# Patient Record
Sex: Female | Born: 1965
Health system: Southern US, Community
[De-identification: ages and names within clinical notes are randomized; demographics above are authoritative.]

## PROBLEM LIST (undated history)

## (undated) DIAGNOSIS — G4710 Hypersomnia, unspecified: Secondary | ICD-10-CM

## (undated) DIAGNOSIS — T7840XA Allergy, unspecified, initial encounter: Secondary | ICD-10-CM

## (undated) DIAGNOSIS — Z973 Presence of spectacles and contact lenses: Secondary | ICD-10-CM

## (undated) DIAGNOSIS — G4733 Obstructive sleep apnea (adult) (pediatric): Secondary | ICD-10-CM

## (undated) DIAGNOSIS — Z9989 Dependence on other enabling machines and devices: Secondary | ICD-10-CM

## (undated) DIAGNOSIS — G4730 Sleep apnea, unspecified: Principal | ICD-10-CM

## (undated) HISTORY — DX: Sleep apnea, unspecified: G47.30

## (undated) HISTORY — DX: Allergy, unspecified, initial encounter: T78.40XA

## (undated) HISTORY — DX: Dependence on other enabling machines and devices: Z99.89

## (undated) HISTORY — DX: Obstructive sleep apnea (adult) (pediatric): G47.33

## (undated) HISTORY — PX: HYSTEROSCOPY WITH D & C: SHX1775

## (undated) HISTORY — DX: Hypersomnia, unspecified: G47.10

## (undated) HISTORY — PX: WISDOM TOOTH EXTRACTION: SHX21

---

## 1998-03-11 ENCOUNTER — Other Ambulatory Visit: Admission: RE | Admit: 1998-03-11 | Discharge: 1998-03-11 | Payer: Self-pay | Admitting: Obstetrics and Gynecology

## 1999-03-24 ENCOUNTER — Ambulatory Visit (HOSPITAL_COMMUNITY): Admission: RE | Admit: 1999-03-24 | Discharge: 1999-03-24 | Payer: Self-pay | Admitting: *Deleted

## 1999-03-24 ENCOUNTER — Encounter: Payer: Self-pay | Admitting: Family Medicine

## 2003-08-01 ENCOUNTER — Ambulatory Visit (HOSPITAL_COMMUNITY): Admission: RE | Admit: 2003-08-01 | Discharge: 2003-08-01 | Payer: Self-pay | Admitting: Emergency Medicine

## 2003-08-01 ENCOUNTER — Encounter: Payer: Self-pay | Admitting: Emergency Medicine

## 2003-12-04 ENCOUNTER — Other Ambulatory Visit: Admission: RE | Admit: 2003-12-04 | Discharge: 2003-12-04 | Payer: Self-pay | Admitting: Obstetrics and Gynecology

## 2005-02-07 ENCOUNTER — Other Ambulatory Visit: Admission: RE | Admit: 2005-02-07 | Discharge: 2005-02-07 | Payer: Self-pay | Admitting: Obstetrics and Gynecology

## 2006-02-07 ENCOUNTER — Other Ambulatory Visit: Admission: RE | Admit: 2006-02-07 | Discharge: 2006-02-07 | Payer: Self-pay | Admitting: Obstetrics and Gynecology

## 2007-01-18 ENCOUNTER — Emergency Department (HOSPITAL_COMMUNITY): Admission: EM | Admit: 2007-01-18 | Discharge: 2007-01-18 | Payer: Self-pay | Admitting: Emergency Medicine

## 2007-01-20 ENCOUNTER — Emergency Department (HOSPITAL_COMMUNITY): Admission: EM | Admit: 2007-01-20 | Discharge: 2007-01-20 | Payer: Self-pay | Admitting: Emergency Medicine

## 2007-08-30 ENCOUNTER — Encounter (INDEPENDENT_AMBULATORY_CARE_PROVIDER_SITE_OTHER): Payer: Self-pay | Admitting: Obstetrics and Gynecology

## 2007-08-30 ENCOUNTER — Ambulatory Visit (HOSPITAL_COMMUNITY): Admission: RE | Admit: 2007-08-30 | Discharge: 2007-08-30 | Payer: Self-pay | Admitting: Obstetrics and Gynecology

## 2011-03-08 NOTE — Op Note (Signed)
NAME:  Alicia [name], Alicia [name] NO.:  [id number]   MEDICAL RECORD NO.:  [id number]          PATIENT TYPE:  AMB   LOCATION:  SDC                           FACILITY:  [facility]   PHYSICIAN:  Janine Limbo, M.D.DATE OF BIRTH:  Jul 06, 1966   DATE OF PROCEDURE:  08/30/2007  DATE OF DISCHARGE:                               OPERATIVE REPORT   PREOPERATIVE DIAGNOSES:  1. Irregular uterine bleeding.  2. Endometrial polyps.  3. Fibroid uterus.   POSTOPERATIVE DIAGNOSES:  1. Irregular uterine bleeding.  2. Endometrial polyps.  3. Fibroid uterus.   PROCEDURE:  1. Hysteroscopy.  2. Dilatation and curettage.   SURGEON:  Janine Limbo, M.D.   FIRST ASSISTANT:  None.   ANESTHESIA:  General.   DISPOSITION:  Alicia [name] is a 45 year old female who presents with  the above-mentioned diagnoses.  She understands the indications for her  surgical procedure, and she accepts the risks of, but not limited to,  anesthetic complications, bleeding, infection, and possible damage to  surrounding organs.   FINDINGS:  The uterus was upper limits normal size and slightly  irregular.  No adnexal masses were appreciated on examination under  anesthesia.  On hysteroscopy, the patient was noted to have several  small polyps measuring less than 0.5 cm in size.  No other uterine  pathology was noted.   PROCEDURE:  The patient was taken to the operating room where general  anesthetic was given.  The patient's abdomen, perineum, and vagina were  prepped with multiple layers of Betadine.  The bladder was drained of  urine.  Examination under anesthesia was performed.  The patient was  sterilely draped.  A paracervical block was placed using 10 mL of 0.5%  Marcaine with epinephrine.  An additional 10 mL of 0.5% Marcaine with  epinephrine were injected directly into the cervix.  An endocervical  curettage was obtained.  The uterus sounded to 8 cm.  The cervix was  gently dilated.  The  operative hysteroscope was inserted, and the cavity  was carefully inspected.  Small polyps and abundant endometrial tissue  were noted.  The operative hysteroscope was removed, and the cavity was  curetted until it was felt to be clean.  Hemostasis was adequate.  The  hysteroscope was reinserted, and the cavity was again inspected.  Pictures were taken.  At this point, we felt that the cavity was clean  and we were ready to end our procedure.  All incisions were removed.  The examination was repeated, and the uterus was noted to be firm.  Sponge, needle, and instrument counts were correct on two occasions.  Estimated blood loss for the procedure was 10 mL.  The patient tolerated  her procedure well.  The estimated fluid deficit was 135 mL, although  there was a large amount of fluid noted on the operating room floor.   The patient was awakened without difficulty and taken to the recovery  room in stable condition.  The endocervical curettage and endometrial  curettings were sent to pathology for evaluation.   FOLLOWUP INSTRUCTIONS:  The patient was given a prescription for  ibuprofen, and she  will take 800 mg every 8 hours as needed for mild-to-  moderate pain.  She will take Darvocet-N 100 1 tablet every 4-6 hours as  needed for severe pain.  She was given a copy of the postoperative  instruction sheet as prepared by the [facility] for  patients who have undergone a dilatation and curettage.  She will return  to see Dr. Stefano Gaul in 2-3 weeks for a followup examination.  She will  call for questions or concerns.      Janine Limbo, M.D.  Electronically Signed     AVS/MEDQ  D:  08/30/2007  T:  08/30/2007  Job:  [id number]

## 2011-03-08 NOTE — H&P (Signed)
NAME:  Alicia [name], Alicia [name] NO.:  [id number]   MEDICAL RECORD NO.:  [id number]          PATIENT TYPE:  AMB   LOCATION:  SDC                           [facility]ILITY:  [facility]   PHYSICIAN:  Janine Limbo, M.D.DATE OF BIRTH:  19-Sep-1966   DATE OF ADMISSION:  DATE OF DISCHARGE:                              HISTORY & PHYSICAL   HISTORY OF PRESENT ILLNESS:  Ms. Mcmullan is a 45 year old female,  gravida 0, who presents for hysteroscopy with resection of an  endometrial polyp.  She will also have a dilatation and curettage.  The  patient is followed at the [facility].  The patient has had an ultrasound performed and  it showed that the uterus measures 8.48 x 5.14 cm.  The patient was  noted to have fibroids, with the largest measuring 1.4 cm in size.  The  ovaries appeared normal.  A hydrosonogram showed that there is a 1.6-cm  polyp present within the endometrial cavity.  GC and chlamydia cultures  of the cervix were negative.  The patient's most recent Pap smear was  within normal limits.   DRUG ALLERGIES:  The patient is allergic to LATEX.   PAST MEDICAL HISTORY:  1. The patient has a history of hypercholesterolemia.  2. She was also told that she had a ruptured disk in her back.   SOCIAL HISTORY:  The patient denies cigarette use, alcohol use, and  recreational drug use.   REVIEW OF SYSTEMS:  The patient has occasional postcoital bleeding.  She  has back pain.   FAMILY HISTORY:  Noncontributory.   PHYSICAL EXAM:  VITAL SIGNS:  Weight is 165 pounds.  HEENT:  Within normal limits.  CHEST:  Clear.  HEART:  Regular rate and rhythm.  BREASTS:  Without masses.  ABDOMEN:  Nontender.  EXTREMITIES:  Grossly normal.  NEUROLOGIC:  Exam is grossly normal.  PELVIC:  External genitalia are normal.  Vagina is normal.  Cervix is  nontender.  Uterus is normal in size, shape and consistency.  Adnexa:  No masses.   Rectovaginal exam confirms.   ASSESSMENT:  1. Irregular menstrual cycles.  2. Endometrial polyp.  3. Fibroid uterus.  4. Postcoital bleeding.   PLAN:  The patient will undergo hysteroscopy with resection of the  endometrial polyp.  She will also have a dilatation and curettage.  She  understands the indications for her surgical procedure and she accepts  the risks of, but not limited to, anesthetic complications, bleeding,  infection, and possible damage to the surrounding organs.      Janine Limbo, M.D.  Electronically Signed     AVS/MEDQ  D:  08/24/2007  T:  08/26/2007  Job:  [id number]

## 2011-08-02 LAB — URINALYSIS, ROUTINE W REFLEX MICROSCOPIC
Bilirubin Urine: NEGATIVE
Glucose, UA: NEGATIVE
Hgb urine dipstick: NEGATIVE
Ketones, ur: NEGATIVE
Nitrite: NEGATIVE
Protein, ur: NEGATIVE
Specific Gravity, Urine: 1.015
Urobilinogen, UA: 0.2
pH: 7

## 2011-08-02 LAB — CBC
HCT: 37.6
Hemoglobin: 13.1
MCHC: 34.8
MCV: 97.5
Platelets: 389
RBC: 3.86 — ABNORMAL LOW
RDW: 12.2
WBC: 5.9

## 2011-08-02 LAB — PREGNANCY, URINE: Preg Test, Ur: NEGATIVE

## 2012-10-15 ENCOUNTER — Ambulatory Visit: Payer: Self-pay | Admitting: Obstetrics and Gynecology

## 2012-11-05 ENCOUNTER — Ambulatory Visit: Payer: Self-pay | Admitting: Obstetrics and Gynecology

## 2012-12-17 ENCOUNTER — Encounter: Payer: Self-pay | Admitting: Obstetrics and Gynecology

## 2012-12-17 ENCOUNTER — Ambulatory Visit: Payer: BC Managed Care – PPO | Admitting: Obstetrics and Gynecology

## 2012-12-17 VITALS — BP 112/68 | Ht 65.6 in | Wt 196.0 lb

## 2012-12-17 DIAGNOSIS — Z01419 Encounter for gynecological examination (general) (routine) without abnormal findings: Secondary | ICD-10-CM

## 2012-12-17 NOTE — Progress Notes (Signed)
ANNUAL GYNECOLOGIC EXAMINATION   Alicia [name] is a 47 y.o. female, G0P0, who presents for an annual exam. Doing well. Not sexually active.    History   Social History  . Marital Status: Single    Spouse Name: N/A    Number of Children: N/A  . Years of Education: N/A   Social History Main Topics  . Smoking status: Never Smoker   . Smokeless tobacco: Never Used  . Alcohol Use: No  . Drug Use: No  . Sexually Active: No   Other Topics Concern  . None   Social History Narrative  . None    Menstrual cycle:   LMP: Patient's last menstrual period was 11/16/2012.             The following portions of the patient's history were reviewed and updated as appropriate: allergies, current medications, past family history, past medical history, past social history, past surgical history and problem list.  Review of Systems Pertinent items are noted in HPI. Breast:Negative for breast lump,nipple discharge or nipple retraction Gastrointestinal: Negative for abdominal pain, change in bowel habits or rectal bleeding Urinary:negative   Objective:    BP 112/68  Ht 5' 5.6" (1.666 m)  Wt 196 lb (88.905 kg)  BMI 32.03 kg/m2  LMP 11/16/2012    Weight:  Wt Readings from Last 1 Encounters:  12/17/12 196 lb (88.905 kg)          BMI: Body mass index is 32.03 kg/(m^2).  General Appearance: Alert, appropriate appearance for age. No acute distress HEENT: Grossly normal Neck / Thyroid: Supple, no masses, nodes or enlargement Lungs: clear to auscultation bilaterally Back: No CVA tenderness Breast Exam: No masses or nodes.No dimpling, nipple retraction or discharge. Cardiovascular: Regular rate and rhythm. S1, S2, no murmur Gastrointestinal: Soft, non-tender, no masses or organomegaly  ++++++++++++++++++++++++++++++++++++++++++++++++++++++++  Pelvic Exam: External genitalia: normal general appearance Vaginal: normal without tenderness, induration or masses. Relaxation: No Cervix:  normal appearance Adnexa: normal bimanual exam Uterus: normal size, shape, and consistency Rectovaginal: normal rectal, no masses  ++++++++++++++++++++++++++++++++++++++++++++++++++++++++  Lymphatic Exam: Non-palpable nodes in neck, clavicular, axillary, or inguinal regions Neurologic: Normal speech, no tremor  Psychiatric: Alert and oriented, appropriate affect.  Assessment:    Normal gyn exam   Overweight or obese: Yes   Pelvic relaxation: No  May want children.   Plan:    mammogram pap smear return annually or prn Contraception:abstinence   Medications prescribed: none  STD screen request: No   The updated Pap smear screening guidelines were discussed with the patient. The patient requested that I obtain a Pap smear: Yes.  Kegel exercises discussed: No.  Proper diet and regular exercise were reviewed.  Annual mammograms recommended starting at age 68. Proper breast care was discussed.  Screening colonoscopy is recommended beginning at age 93.  Regular health maintenance was reviewed.  Sleep hygiene was discussed.  Adequate calcium and vitamin D intake was emphasized.  IUI and egg preservation discussed.  Leonard Schwartz M.D.    Regular Periods: yes Mammogram: yes  Monthly Breast Ex.: yes Exercise: yes  Tetanus < 10 years: yes Seatbelts: yes  NI. Bladder Functn.: yes Abuse at home: no  Daily BM's: yes Stressful Work: yes  Healthy Diet: yes Sigmoid-Colonoscopy: never had one   Calcium: yes Medical problems this year: none   LAST PAP:06/25/2008  Contraception: none   Mammogram:  11/09/2012  PCP: Dr.Fland  PMH: none  FMH: none

## 2012-12-18 LAB — PAP IG W/ RFLX HPV ASCU

## 2012-12-19 LAB — HUMAN PAPILLOMAVIRUS, HIGH RISK: HPV DNA High Risk: NOT DETECTED

## 2012-12-19 NOTE — Progress Notes (Signed)
Quick Note:  Send atypical Pap smear letter with a note to return for repeat Pap in 12 months. ______ 

## 2012-12-20 ENCOUNTER — Encounter: Payer: Self-pay | Admitting: Obstetrics and Gynecology

## 2013-05-23 ENCOUNTER — Encounter: Payer: Self-pay | Admitting: Neurology

## 2013-05-23 ENCOUNTER — Ambulatory Visit (INDEPENDENT_AMBULATORY_CARE_PROVIDER_SITE_OTHER): Payer: BC Managed Care – PPO | Admitting: Neurology

## 2013-05-23 VITALS — BP 112/78 | HR 65 | Ht 65.0 in | Wt 202.1 lb

## 2013-05-23 DIAGNOSIS — G4710 Hypersomnia, unspecified: Secondary | ICD-10-CM

## 2013-05-23 DIAGNOSIS — G4730 Sleep apnea, unspecified: Secondary | ICD-10-CM

## 2013-05-23 HISTORY — DX: Hypersomnia, unspecified: G47.10

## 2013-05-23 MED ORDER — MODAFINIL 200 MG PO TABS: 100.0000 mg | ORAL_TABLET | Freq: Two times a day (BID) | ORAL | Status: DC | PRN

## 2013-05-23 NOTE — Progress Notes (Signed)
Subjective:    Patient ID: Alicia [name] is a 47 y.o. female.  HPI  Huston Foley, MD, PhD [facility] [address], [address] [address] [place], [place] [address]  Dear Dr. Parke Simmers,   I Saw your patient, Alicia [name], upon your kind request in my neurologic clinic today for initial consultation of her sleep disorder, in particular concern for excessive daytime somnolence. The patient is unaccompanied today. As you know, Alicia [name] is a very pleasant 47 year old right-handed woman with an underlying medical history of allergies, hyperlipidemia, obstructive sleep apnea on CPAP who has been here sleepy despite being compliant with CPAP therapy. Her father carries a diagnosis of narcolepsy with cataplexy and she is particularly concerned about this diagnosis and her family. She brought in some records including a recent CPAP compliance download which I reviewed. The compliance data was from 04/02/2013 through 05/22/2013 which is a total of 51 days during which she used CPAP every day. Percent used days greater than 4 hours was 96% indicating excellent compliance. Her pressure is 10 cm water pressure with an EPR of 2. Residual AHI is 4.7 indicating an appropriate pressure level. Average usage was 6:41 hours. I also reviewed a sleep study report from 11/10/2011. This was a split-night study. REM percentage was 17% and the baseline portion of the study. Her AHI was 61.9 per hour with an oxyhemoglobin desaturation nadir of 75%. She was titrated on CPAP from 4-10 cm of water pressure with elimination of her sleep disordered breathing reported. She uses a nasal pillows, F&P Pillairo Q. I also reviewed older compliance data from August to Nov. 2013, during which time she was very compliant but had a high leak. At the time, she was using a FFM and leak was much better with the new mask.  Her typical bedtime is reported to be around 11 PM and usual wake time is around 7 AM.  Sleep onset typically occurs within a few minutes. She reports feeling not very well rested upon awakening. She wakes up on an average 1 in the middle of the night and has to go to the bathroom 0 to 1 times on a typical night. She denies morning headaches. All of this improved after going on CPAP. In general, she feels much better since starting CPAP, but has had residual sleepiness.  She reports excessive daytime somnolence (EDS) and Her Epworth Sleepiness Score (ESS) is 12/24 today. She has not fallen asleep while driving. The patient has not been taking a planned nap. She denies dreaming in a nap and reports feeling refreshed after a nap.  She has been known to snore for the past many years. Snoring is reportedly moderate, and associated with choking sounds and witnessed apneas, but this is improved with CPAP. The patient denies a sense of choking or strangling feeling since the CPAP, but at times seems to fight the machine. There is no report of nighttime reflux, with rare nighttime cough experienced. The patient has not noted any RLS symptoms and is not known to kick while asleep or before falling asleep. She is not a very restless sleeper.   She denies cataplexy, sleep paralysis, hypnagogic or hypnopompic hallucinations, or sleep attacks, but she gets very sleepy after eating carbohydrates. She does not report any vivid dreams, nightmares, dream enactments, or parasomnias, such as sleep talking or sleep walking. She consumes 0 to 1 caffeinated beverage per day, usually in the form of soda.   Her Past Medical History Is Significant For:  Past Medical History  Diagnosis Date  . Allergy     Her Past Surgical History Is Significant For: Past Surgical History  Procedure Laterality Date  . Hysteroscopy w/d&c    . Wisdom tooth extraction      Her Family History Is Significant For: Family History  Problem Relation Age of Onset  . Diabetes Father     type 2  . Depression Father   . Dementia  Father   . Narcolepsy Father     cataplexy  . High Cholesterol Father   . Stroke Maternal Grandmother   . Heart disease Maternal Grandfather     Her Social History Is Significant For: History   Social History  . Marital Status: Single    Spouse Name: N/A    Number of Children: 0  . Years of Education: Post Grad   Occupational History  .      [employer]   Social History Main Topics  . Smoking status: Never Smoker   . Smokeless tobacco: Never Used  . Alcohol Use: No  . Drug Use: No  . Sexually Active: No   Other Topics Concern  . None   Social History Narrative   Patient lives at home alone.   Caffeine Use: occasionally soda    Her Allergies Are:  Allergies  Allergen Reactions  . Latex   . Other     Seasonal   . Lubricants   :   Her Current Medications Are:  Outpatient Encounter Prescriptions as of 05/23/2013  Medication Sig Dispense Refill  . b complex vitamins capsule Take 1 capsule by mouth daily.      . Bepotastine Besilate (BEPREVE) 1.5 % SOLN Place 1 drop into both eyes once.      . calcium carbonate 200 MG capsule Take 250 mg by mouth 2 (two) times daily with a meal.      . cholecalciferol (VITAMIN D) 1000 UNITS tablet Take 1,000 Units by mouth daily.      Marland Kitchen desonide (DESOWEN) 0.05 % lotion Apply 1 application topically 2 (two) times daily.      Marland Kitchen loratadine (CLARITIN) 10 MG tablet Take 10 mg by mouth daily.      . pimecrolimus (ELIDEL) 1 % cream Apply 1 application topically 2 (two) times daily.      Marland Kitchen pyridoxine (B-6) 100 MG tablet Take 100 mg by mouth daily.      . Tacrolimus (PROTOPIC EX) Apply 1 application topically.      . Triamcinolone Acetonide (NASACORT ALLERGY 24HR) 55 MCG/ACT AERO Place 2 sprays into the nose 2 (two) times daily.      . vitamin B-12 (CYANOCOBALAMIN) 100 MCG tablet Take 50 mcg by mouth daily.      . vitamin E 100 UNIT capsule Take 100 Units by mouth daily.      . [DISCONTINUED] mometasone (NASONEX) 50 MCG/ACT nasal  spray Place 2 sprays into the nose daily.       No facility-administered encounter medications on file as of 05/23/2013.   Review of Systems  Constitutional: Positive for unexpected weight change (weight gain).  Allergic/Immunologic: Positive for environmental allergies.  Neurological: Positive for numbness.  Psychiatric/Behavioral: Positive for sleep disturbance (not enough sleep, decreased energy, sleepiness).    Objective:  Neurologic Exam  Physical Exam Physical Examination:   Filed Vitals:   05/23/13 1035  BP: 112/78  Pulse: 65    General Examination: The patient is a very pleasant 47 y.o. female in no acute distress. She  appears well-developed and well-nourished and very well groomed.   HEENT: Normocephalic, atraumatic, pupils are equal, round and reactive to light and accommodation. Funduscopic exam is normal with sharp disc margins noted. Extraocular tracking is good without limitation to gaze excursion or nystagmus noted. Normal smooth pursuit is noted. Hearing is grossly intact. Tympanic membranes are clear bilaterally. Face is symmetric with normal facial animation and normal facial sensation. Speech is clear with no dysarthria noted. There is no hypophonia. There is no lip, neck/head, jaw or voice tremor. Neck is supple with full range of passive and active motion. There are no carotid bruits on auscultation. Oropharynx exam reveals: mild mouth dryness, good dental hygiene and moderate airway crowding, due to elongated tongue and narrow airway entry. Mallampati is class I. Tongue protrudes centrally and palate elevates symmetrically. Tonsils are 1+. Neck size is 14.5 inches.   Chest: Clear to auscultation without wheezing, rhonchi or crackles noted.  Heart: S1+S2+0, regular and normal without murmurs, rubs or gallops noted.   Abdomen: Soft, non-tender and non-distended with normal bowel sounds appreciated on auscultation.  Extremities: There is no pitting edema in the  distal lower extremities bilaterally. Pedal pulses are intact.  Skin: Warm and dry without trophic changes noted. There are no varicose veins.  Musculoskeletal: exam reveals no obvious joint deformities, tenderness or joint swelling or erythema.   Neurologically:  Mental status: The patient is awake, alert and oriented in all 4 spheres. Her memory, attention, language and knowledge are appropriate. There is no aphasia, agnosia, apraxia or anomia. Speech is clear with normal prosody and enunciation. Thought process is linear. Mood is congruent and affect is normal.  Cranial nerves are as described above under HEENT exam. In addition, shoulder shrug is normal with equal shoulder height noted. Motor exam: Normal bulk, strength and tone is noted. There is no drift, tremor or rebound. Romberg is negative. Reflexes are 2+ throughout. Toes are downgoing bilaterally. Fine motor skills are intact with normal finger taps, normal hand movements, normal rapid alternating patting, normal foot taps and normal foot agility.  Cerebellar testing shows no dysmetria or intention tremor on finger to nose testing. There is no truncal or gait ataxia.  Sensory exam is intact to light touch.  Gait, station and balance are unremarkable. No veering to one side is noted. No leaning to one side is noted. Posture is age-appropriate and stance is narrow based. No problems turning are noted. She turns en bloc. Tandem walk is unremarkable. Intact toe and heel stance is noted.               Assessment and Plan:  In summary, Alicia [name] is a very pleasant 47 y.o.-year old female with a history of obstructive sleep apnea (OSA), successfully treated with CPAP of 10 cm with excellent compliance, but residual EDS with an ESS of 12/24 today. While she does have a FHx of narcolepsy with cataplexy in her father, her history is not very suggestive of narcolepsy.  I had a long chat with the patient about my findings and her Sx, and  the diagnosis of OSA with residual somnolence, its prognosis and treatment options. We talked about medical treatments and non-pharmacological approaches. I explained in particular the risks and ramifications of untreated moderate to severe OSA and commended her on being compliant with treatment and asked her to continue with CPAP. I think her treatment pressure is adequate and her leak is low since she switched masks some 8 months ago. I suggested that she  try Provigil to help stay awake during the day a little better. She can try this as needed. I went over common side effects of this medication with her. She is to start at 200 mg strength half a pill up to twice daily as . I advised her not to take it after 3 PM to avoid insomnia at night. We also talked about trying to maintain a healthy lifestyle in general, as well as the importance of weight control. I encouraged the patient to eat healthy, exercise daily and keep well hydrated, to keep a scheduled bedtime and wake time routine, to not skip any meals and eat healthy snacks in between meals.  I recommended the following at this time: Trial of Provigil for about 3 months and I would like to get together after that. If in the interim she has any questions she is encouraged to call. Down the Road we can certainly consider a repeat sleep study with CPAP and nap study with CPAP if she has ongoing issues with sleepiness but again I feel her pretest probability for narcolepsy-like features is rather low.  she understood and was in agreement.   Thank you very much for allowing me to participate in the care of this nice patient. If I can be of any further assistance to you please do not hesitate to call me at [phone].  Sincerely,   Huston Foley, MD, PhD

## 2013-05-23 NOTE — Patient Instructions (Signed)
I think overall you are doing fairly well but I do want to suggest a few things today:  Remember to drink plenty of fluid, eat healthy meals and do not skip any meals. Try to eat protein with a every meal and eat a healthy snack such as fruit or nuts in between meals. Try to keep a regular sleep-wake schedule and try to exercise daily, particularly in the form of walking, 20-30 minutes a day, if you can.   Engage in social activities in your community and with your family and try to keep up with current events by reading the newspaper or watching the news.   As far as your medications are concerned, I would like to suggest a trial of Provigil 200 mg strength, take 1/2 pill up to twice daily as needed for sleepiness, do not take later than 3 PM to avoid insomnia at night.   As far as diagnostic testing: we will consider another sleep study with nap study down the road if we need to.   I would like to see you back in 3 months, sooner if we need to. Please call us with any interim questions, concerns, problems, updates or refill requests.  Brett Canales is my clinical assistant and will answer any of your questions and relay your messages to me and also relay most of my messages to you.  Our phone number is [phone]. We also have an after hours call service for urgent matters and there is a physician on-call for urgent questions. For any emergencies you know to call 911 or go to the nearest emergency room.

## 2013-06-05 ENCOUNTER — Encounter: Payer: Self-pay | Admitting: Neurology

## 2013-06-11 ENCOUNTER — Telehealth: Payer: Self-pay

## 2013-06-11 NOTE — Telephone Encounter (Signed)
Patient called requesting we send a Prior Auth request to her ins for Modafanil.  I have already submitted all paperwork to them, pending response.

## 2013-08-15 ENCOUNTER — Ambulatory Visit: Payer: BC Managed Care – PPO | Admitting: Neurology

## 2013-08-15 ENCOUNTER — Ambulatory Visit (INDEPENDENT_AMBULATORY_CARE_PROVIDER_SITE_OTHER): Payer: BC Managed Care – PPO | Admitting: Neurology

## 2013-08-15 ENCOUNTER — Encounter: Payer: Self-pay | Admitting: Neurology

## 2013-08-15 ENCOUNTER — Encounter (INDEPENDENT_AMBULATORY_CARE_PROVIDER_SITE_OTHER): Payer: Self-pay

## 2013-08-15 VITALS — BP 114/79 | HR 64 | Temp 98.2°F | Ht 65.0 in | Wt 194.0 lb

## 2013-08-15 DIAGNOSIS — G4733 Obstructive sleep apnea (adult) (pediatric): Secondary | ICD-10-CM

## 2013-08-15 DIAGNOSIS — G4710 Hypersomnia, unspecified: Secondary | ICD-10-CM

## 2013-08-15 DIAGNOSIS — Z9989 Dependence on other enabling machines and devices: Secondary | ICD-10-CM

## 2013-08-15 HISTORY — DX: Obstructive sleep apnea (adult) (pediatric): G47.33

## 2013-08-15 HISTORY — DX: Dependence on other enabling machines and devices: Z99.89

## 2013-08-15 MED ORDER — ARMODAFINIL 150 MG PO TABS: 150.0000 mg | ORAL_TABLET | Freq: Every day | ORAL | Status: DC

## 2013-08-15 NOTE — Patient Instructions (Signed)
Please continue using your CPAP regularly. While your insurance requires that you use CPAP at least 4 hours each night on 70% of the nights, I recommend, that you not skip any nights and use it throughout the night if you can. Getting used to CPAP does take time and patience and discipline. Untreated obstructive sleep apnea when it is moderate to severe can have an adverse impact on cardiovascular health and raise her risk for heart disease, arrhythmias, hypertension, congestive heart failure, stroke and diabetes. Untreated obstructive sleep apnea causes sleep disruption, nonrestorative sleep, and sleep deprivation. This can have an impact on your day to day functioning and cause daytime sleepiness and impairment of cognitive function, memory loss, mood disturbance, and problems focussing. Using CPAP regularly can improve these symptoms.   

## 2013-08-15 NOTE — Progress Notes (Signed)
Subjective:    Patient ID: Alicia [name] is a 47 y.o. female.  HPI  Interim history:   Alicia [name] is a very pleasant 47 year old right-handed woman with an underlying medical history of allergies, hyperlipidemia, and prior Dx of obstructive sleep apnea on CPAP, who presents for followup consultation of her residual daytime somnolence. She is unaccompanied today. I first met her on 05/23/2013, at which time she reported being sleepy despite being compliant with CPAP therapy. I suggested that she try Provigil to help stay awake during the day a little better, starting with 200 mg strength half a pill up to twice daily as needed. I advised her not to take it after 3 PM to avoid insomnia at night. She has been trying to lose weight. She has had success with the provigil 1/2 pill twice daily. She denies SEs.   She has a FHx of narcolepsy with cataplexy in her father and was particularly concerned that she may have narcolepsy. I had reviewed records including a recent CPAP compliance download from 04/02/2013 through 05/22/2013, total of 51 days, during which time she used CPAP every day. Percent used days greater than 4 hours was 96% indicating excellent compliance. Her CPAP pressure is 10 cm water pressure with an EPR of 2. Residual AHI is 4.7 indicating an appropriate pressure level. Average usage was 6:41 hours. I also reviewed a sleep study report from 11/10/2011. This was a split-night study. REM percentage was 17% and the baseline portion of the study. Her AHI was 61.9 per hour with an oxyhemoglobin desaturation nadir of 75%. She was titrated on CPAP from 4-10 cm of water pressure with elimination of her sleep disordered breathing reported. She uses a nasal pillows mask. I also reviewed older compliance data from August to Nov. 2013, during which time she was very compliant but had a high leak. At the time, she was using a FFM and leak was much better with the new mask.  Her typical bedtime is  reported to be around 11 PM and usual wake time is around 7 AM. Sleep onset typically occurs within a few minutes. She reported feeling not very well rested upon awakening, but this improved after starting CPAP. In general, she feels much better since starting CPAP, but has had residual sleepiness with an ESS of 12/24 last time. She has not fallen asleep while driving. The patient has not been taking a planned nap. She denies dreaming in a nap and reports feeling refreshed after a nap.  She has been known to snore for the past many years. Snoring is reportedly moderate, and associated with choking sounds and witnessed apneas, but this is improved with CPAP. She has no Hx of cataplexy, sleep paralysis, hypnagogic or hypnopompic hallucinations, or sleep attacks, but she gets very sleepy after eating carbohydrates. She consumes 0 to 1 caffeinated beverage per day, usually in the form of soda.   Her Past Medical History Is Significant For: Past Medical History  Diagnosis Date  . Allergy   . Hypersomnia with sleep apnea, unspecified 05/23/2013    Her Past Surgical History Is Significant For: Past Surgical History  Procedure Laterality Date  . Hysteroscopy w/d&c    . Wisdom tooth extraction      Her Family History Is Significant For: Family History  Problem Relation Age of Onset  . Diabetes Father     type 2  . Depression Father   . Dementia Father   . Narcolepsy Father     cataplexy  .  High Cholesterol Father   . Stroke Maternal Grandmother   . Heart disease Maternal Grandfather     Her Social History Is Significant For: History   Social History  . Marital Status: Single    Spouse Name: N/A    Number of Children: 0  . Years of Education: Post Grad   Occupational History  .      [employer]   Social History Main Topics  . Smoking status: Never Smoker   . Smokeless tobacco: Never Used  . Alcohol Use: No  . Drug Use: No  . Sexual Activity: No   Other Topics Concern   . None   Social History Narrative   Patient lives at home alone.   Caffeine Use: occasionally soda    Her Allergies Are:  Allergies  Allergen Reactions  . Latex   . Other     Seasonal   . Lubricants   :   Her Current Medications Are:  Outpatient Encounter Prescriptions as of 08/15/2013  Medication Sig Dispense Refill  . b complex vitamins capsule Take 1 capsule by mouth daily.      . Bepotastine Besilate (BEPREVE) 1.5 % SOLN Place 1 drop into both eyes once.      . betamethasone valerate lotion (VALISONE) 0.1 % Apply 1 application topically daily.      . calcium carbonate 200 MG capsule Take 250 mg by mouth 2 (two) times daily with a meal.      . cholecalciferol (VITAMIN D) 1000 UNITS tablet Take 1,000 Units by mouth daily.      Marland Kitchen desonide (DESOWEN) 0.05 % lotion Apply 1 application topically 2 (two) times daily.      Marland Kitchen loratadine (CLARITIN) 10 MG tablet Take 10 mg by mouth daily.      . modafinil (PROVIGIL) 200 MG tablet Take 0.5 tablets (100 mg total) by mouth 2 (two) times daily as needed. Do not take later than 3 PM.  30 tablet  5  . pimecrolimus (ELIDEL) 1 % cream Apply 1 application topically 2 (two) times daily.      Marland Kitchen pyridoxine (B-6) 100 MG tablet Take 100 mg by mouth daily.      . Tacrolimus (PROTOPIC EX) Apply 1 application topically.      . topiramate (TOPAMAX) 25 MG tablet Take 1 tablet by mouth as needed.      . Triamcinolone Acetonide (NASACORT ALLERGY 24HR) 55 MCG/ACT AERO Place 2 sprays into the nose 2 (two) times daily.      . vitamin B-12 (CYANOCOBALAMIN) 100 MCG tablet Take 50 mcg by mouth daily.      . vitamin E 100 UNIT capsule Take 100 Units by mouth daily.       No facility-administered encounter medications on file as of 08/15/2013.  :  Review of Systems:  Out of a complete 14 point review of systems, all are reviewed and negative with the exception of these symptoms as listed below:   Review of Systems  Constitutional: Negative.   HENT: Negative.    Eyes: Negative.   Respiratory: Negative.   Cardiovascular: Negative.   Gastrointestinal: Negative.   Endocrine: Negative.   Genitourinary: Negative.   Musculoskeletal: Negative.   Skin: Negative.   Allergic/Immunologic: Positive for environmental allergies.  Neurological: Negative.   Hematological: Negative.   Psychiatric/Behavioral: Negative.     Objective:  Neurologic Exam  Physical Exam Physical Examination:   Filed Vitals:   08/15/13 1102  BP: 114/79  Pulse: 64  Temp: 98.2  F (36.8 C)    General Examination: The patient is a very pleasant 47 y.o. female in no acute distress. She appears well-developed and well-nourished and very well groomed.   HEENT: Normocephalic, atraumatic, pupils are equal, round and reactive to light and accommodation. Extraocular tracking is good without limitation to gaze excursion or nystagmus noted. Normal smooth pursuit is noted. Hearing is grossly intact. Face is symmetric with normal facial animation and normal facial sensation. Speech is clear with no dysarthria noted. There is no hypophonia. There is no lip, neck/head, jaw or voice tremor. Neck is supple with full range of passive and active motion. There are no carotid bruits on auscultation. Oropharynx exam reveals: mild mouth dryness, good dental hygiene and moderate airway crowding, due to elongated tongue and narrow airway entry. Mallampati is class I. Tongue protrudes centrally and palate elevates symmetrically. Tonsils are 1+. Neck size is 14.5 inches. Nasal inspection shows no significant deviated septum and mild mucosal bogginess. No significant erythema is noted.  Chest: Clear to auscultation without wheezing, rhonchi or crackles noted.  Heart: S1+S2+0, regular and normal without murmurs, rubs or gallops noted.   Abdomen: Soft, non-tender and non-distended with normal bowel sounds appreciated on auscultation.  Extremities: There is no pitting edema in the distal lower extremities  bilaterally. Pedal pulses are intact.  Skin: Warm and dry without trophic changes noted. There are no varicose veins.  Musculoskeletal: exam reveals no obvious joint deformities, tenderness or joint swelling or erythema.   Neurologically:  Mental status: The patient is awake, alert and oriented in all 4 spheres. Her memory, attention, language and knowledge are appropriate. There is no aphasia, agnosia, apraxia or anomia. Speech is clear with normal prosody and enunciation. Thought process is linear. Mood is congruent and affect is normal.  Cranial nerves are as described above under HEENT exam. In addition, shoulder shrug is normal with equal shoulder height noted. Motor exam: Normal bulk, strength and tone is noted. There is no drift, tremor or rebound. Romberg is negative. Reflexes are 2+ throughout. Toes are downgoing bilaterally. Fine motor skills are intact with normal finger taps, normal hand movements, normal rapid alternating patting, normal foot taps and normal foot agility.  Cerebellar testing shows no dysmetria or intention tremor on finger to nose testing. There is no truncal or gait ataxia.  Sensory exam is intact to light touch.  Gait, station and balance are unremarkable. No veering to one side is noted. No leaning to one side is noted. Posture is age-appropriate and stance is narrow based. No problems turning are noted. She turns en bloc.          Assessment and Plan:   In summary, Aerilyn Slee is a very pleasant 47 year old female with a history of obstructive sleep apnea (OSA), successfully treated with CPAP of 10 cm with excellent compliance, but a history of severe residual EDS. While she does have a FHx of narcolepsy with cataplexy in her father, her history is not very suggestive of narcolepsy. She has been using Provigil as needed 100 mg up to twice daily and feels it has helped. She feels more awake during the day. She denies any side effects. I suggested that she  could try Nuvigil 150 mg once daily and provided her with a 30 day free prescription card. She has had trouble remembering the second dose of the Provigil it may be able to take this consistently without any lapses. She is agreeable to trying this next time she is up  for a refill. If this works out for her we can change her prescription to [facility]. Otherwise, we can continue with Provigil twice daily. I  encouraged her to continue with CPAP. I think her treatment pressure is adequate and her leak is low since she switched masks. We also talked about trying to maintain a healthy lifestyle in general, as well as the importance of weight control. Thankfully, she has been able to lose some weight and she is more active. I encouraged the patient to eat healthy, exercise daily and keep well hydrated, to keep a scheduled bedtime and wake time routine, to not skip any meals and eat healthy snacks in between meals. I suggested a six-month followup, sooner if the need arises and in the interim she will call us and update [place] on the trial of Nuvigil. Most of my 35 minute visit today was spent in counseling and coordination of care, reviewing test results and reviewing medication.

## 2014-02-04 ENCOUNTER — Telehealth: Payer: Self-pay | Admitting: *Deleted

## 2014-02-04 NOTE — Telephone Encounter (Signed)
[facility] still has refill on file.  I called the patient back.  Got no answer.  Left message.

## 2014-02-04 NOTE — Telephone Encounter (Signed)
Pt called regarding rx for Armodafinil (NUVIGIL) 150 MG tablet.  She saw Dr Frances FurbishAthar on 08/15/13 and given RX. Pt didn't have med filled and requesting rx called [facility].  Please call patient regarding this matter.  Thanks

## 2014-02-05 ENCOUNTER — Other Ambulatory Visit: Payer: Self-pay | Admitting: Physical Medicine and Rehabilitation

## 2014-02-05 DIAGNOSIS — M542 Cervicalgia: Secondary | ICD-10-CM

## 2014-02-05 DIAGNOSIS — G894 Chronic pain syndrome: Secondary | ICD-10-CM

## 2014-02-05 DIAGNOSIS — M5412 Radiculopathy, cervical region: Secondary | ICD-10-CM

## 2014-02-11 ENCOUNTER — Ambulatory Visit
Admission: RE | Admit: 2014-02-11 | Discharge: 2014-02-11 | Disposition: A | Discharge status: Home / Self Care | Payer: BC Managed Care – PPO | Source: Ambulatory Visit | Attending: Physical Medicine and Rehabilitation | Admitting: Physical Medicine and Rehabilitation

## 2014-02-11 ENCOUNTER — Telehealth: Payer: Self-pay

## 2014-02-11 DIAGNOSIS — G894 Chronic pain syndrome: Secondary | ICD-10-CM

## 2014-02-11 DIAGNOSIS — M5412 Radiculopathy, cervical region: Secondary | ICD-10-CM

## 2014-02-11 DIAGNOSIS — M542 Cervicalgia: Secondary | ICD-10-CM

## 2014-02-11 NOTE — Telephone Encounter (Signed)
[employer] [place] sent us a letter saying they have approved our request for coverage on Nuvigil effective until 10/23/2038, or until the policy changes or is terminated.  Ref # [id number]

## 2014-02-13 ENCOUNTER — Ambulatory Visit (INDEPENDENT_AMBULATORY_CARE_PROVIDER_SITE_OTHER): Payer: BC Managed Care – PPO | Admitting: Neurology

## 2014-02-13 ENCOUNTER — Encounter (INDEPENDENT_AMBULATORY_CARE_PROVIDER_SITE_OTHER): Payer: Self-pay

## 2014-02-13 ENCOUNTER — Encounter: Payer: Self-pay | Admitting: Neurology

## 2014-02-13 VITALS — BP 110/78 | HR 65 | Temp 98.3°F | Ht 65.0 in | Wt 194.0 lb

## 2014-02-13 DIAGNOSIS — G4710 Hypersomnia, unspecified: Secondary | ICD-10-CM

## 2014-02-13 DIAGNOSIS — G4733 Obstructive sleep apnea (adult) (pediatric): Secondary | ICD-10-CM

## 2014-02-13 DIAGNOSIS — Z9989 Dependence on other enabling machines and devices: Secondary | ICD-10-CM

## 2014-02-13 DIAGNOSIS — G4730 Sleep apnea, unspecified: Principal | ICD-10-CM

## 2014-02-13 MED ORDER — ARMODAFINIL 150 MG PO TABS: 150.0000 mg | ORAL_TABLET | Freq: Every day | ORAL | Status: DC

## 2014-02-13 NOTE — Patient Instructions (Signed)
Follow up in 6 months 

## 2014-02-13 NOTE — Progress Notes (Signed)
Subjective:    Patient ID: Alicia [name] is a 48 y.o. female.  HPI    Interim history:   Alicia [name] is a very pleasant 48 year old right-handed woman with an underlying medical history of allergies, hyperlipidemia, and prior Dx of obstructive sleep apnea on CPAP, who presents for followup consultation of her residual EDS. I last saw her on 08/15/2013, at which time we talked about her sleep test results from 2013 and her CPAP compliance which was excellent. She has a family history of narcolepsy with cataplexy in her father. She felt Provigil as needed was helpful. She had trouble remembering the second dose of Provigil and therefore I suggested a trial of Nuvigil once daily. She was just recently approved for this.  Today, she reports that she finished her generic modafinil, and has not yet filled her Rx for Nuvigil. She has no new complaints. She is wondering whether she needs an MSLT. She has been compliant with her CPAP machine.  I first met her on 05/23/2013, at which time she reported being veyr sleepy despite being compliant with CPAP therapy. I suggested that she try Provigil to help stay awake during the day a little better, starting with 200 mg strength half a pill up to twice daily as needed. I advised her not to take it after 3 PM to avoid insomnia at night.  She has a FHx of narcolepsy with cataplexy in her father and was particularly concerned that she may have narcolepsy. I had reviewed records including a recent CPAP compliance download from 04/02/2013 through 05/22/2013, total of 51 days, during which time she used CPAP every day. Percent used days greater than 4 hours was 96% indicating excellent compliance. Her CPAP pressure is 10 cm water pressure with an EPR of 2. Residual AHI is 4.7 indicating an appropriate pressure level. Average usage was 6:41 hours. I also reviewed a sleep study report from 11/10/2011. This was a split-night study. REM percentage was 17% and the  baseline portion of the study. Her AHI was 61.9 per hour with an oxyhemoglobin desaturation nadir of 75%. She was titrated on CPAP from 4-10 cm of water pressure with elimination of her sleep disordered breathing reported. She uses a nasal pillows mask. I also reviewed older compliance data from August to Nov. 2013, during which time she was very compliant but had a high leak. At the time, she was using a FFM and leak was much better with the new mask.   Her Past Medical History Is Significant For: Past Medical History  Diagnosis Date  . Allergy   . Hypersomnia with sleep apnea, unspecified 05/23/2013  . OSA on CPAP 08/15/2013    Her Past Surgical History Is Significant For: Past Surgical History  Procedure Laterality Date  . Hysteroscopy w/d&c    . Wisdom tooth extraction      Her Family History Is Significant For: Family History  Problem Relation Age of Onset  . Diabetes Father     type 2  . Depression Father   . Dementia Father   . Narcolepsy Father     cataplexy  . High Cholesterol Father   . Stroke Maternal Grandmother   . Heart disease Maternal Grandfather     Her Social History Is Significant For: History   Social History  . Marital Status: Single    Spouse Name: N/A    Number of Children: 0  . Years of Education: Post Grad   Occupational History  .  [employer]   Social History Main Topics  . Smoking status: Never Smoker   . Smokeless tobacco: Never Used  . Alcohol Use: No  . Drug Use: No  . Sexual Activity: No   Other Topics Concern  . None   Social History Narrative   Patient lives at home alone.   Caffeine Use: occasionally soda    Her Allergies Are:  Allergies  Allergen Reactions  . Latex   . Other     Seasonal   . Lubricants   :   Her Current Medications Are:  Outpatient Encounter Prescriptions as of 02/13/2014  Medication Sig  . Armodafinil (NUVIGIL) 150 MG tablet Take 1 tablet (150 mg total) by mouth daily. To be used  with free 30 day trial offer.  . b complex vitamins capsule Take 1 capsule by mouth daily.  . Bepotastine Besilate (BEPREVE) 1.5 % SOLN Place 1 drop into both eyes once.  . betamethasone valerate lotion (VALISONE) 0.1 % Apply 1 application topically daily.  . calcium carbonate 200 MG capsule Take 250 mg by mouth 2 (two) times daily with a meal.  . cholecalciferol (VITAMIN D) 1000 UNITS tablet Take 1,000 Units by mouth daily.  Marland Kitchen desonide (DESOWEN) 0.05 % lotion Apply 1 application topically 2 (two) times daily.  Marland Kitchen loratadine (CLARITIN) 10 MG tablet Take 10 mg by mouth daily.  . modafinil (PROVIGIL) 200 MG tablet Take 0.5 tablets (100 mg total) by mouth 2 (two) times daily as needed. Do not take later than 3 PM.  . pimecrolimus (ELIDEL) 1 % cream Apply 1 application topically 2 (two) times daily.  Marland Kitchen pyridoxine (B-6) 100 MG tablet Take 100 mg by mouth daily.  . Tacrolimus (PROTOPIC EX) Apply 1 application topically.  . topiramate (TOPAMAX) 25 MG tablet Take 1 tablet by mouth as needed.  . Triamcinolone Acetonide (NASACORT ALLERGY 24HR) 55 MCG/ACT AERO Place 2 sprays into the nose 2 (two) times daily.  . vitamin B-12 (CYANOCOBALAMIN) 100 MCG tablet Take 50 mcg by mouth daily.  . vitamin E 100 UNIT capsule Take 100 Units by mouth daily.  :  Review of Systems:  Out of a complete 14 point review of systems, all are reviewed and negative with the exception of these symptoms as listed below:  Review of Systems  Constitutional: Negative.   HENT: Negative.   Eyes: Negative.   Respiratory: Negative.   Cardiovascular: Negative.   Gastrointestinal: Negative.   Endocrine: Negative.   Genitourinary: Negative.   Musculoskeletal: Negative.   Skin: Negative.   Allergic/Immunologic: Negative.   Neurological: Negative.   Hematological: Negative.   Psychiatric/Behavioral: Positive for sleep disturbance (e.d.s.).    Objective:  Neurologic Exam  Physical Exam Physical Examination:   Filed Vitals:    02/13/14 1451  BP: 110/78  Pulse: 65  Temp: 98.3 F (36.8 C)   General Examination: The patient is a very pleasant 48 y.o. female in no acute distress. She appears well-developed and well-nourished and very well groomed.   HEENT: Normocephalic, atraumatic, pupils are equal, round and reactive to light and accommodation. Extraocular tracking is good without limitation to gaze excursion or nystagmus noted. Normal smooth pursuit is noted. Hearing is grossly intact. Face is symmetric with normal facial animation and normal facial sensation. Speech is clear with no dysarthria noted. There is no hypophonia. There is no lip, neck/head, jaw or voice tremor. Neck is supple with full range of passive and active motion. There are no carotid bruits on auscultation. Oropharynx exam  reveals: mild mouth dryness, good dental hygiene and moderate airway crowding, due to elongated tongue and narrow airway entry. Mallampati is class I. Tongue protrudes centrally and palate elevates symmetrically. Tonsils are 1+. Neck size is 14.5 inches. Nasal inspection shows no significant deviated septum and mild mucosal bogginess. No significant erythema is noted.  Chest: Clear to auscultation without wheezing, rhonchi or crackles noted.  Heart: S1+S2+0, regular and normal without murmurs, rubs or gallops noted.   Abdomen: Soft, non-tender and non-distended with normal bowel sounds appreciated on auscultation.  Extremities: There is no pitting edema in the distal lower extremities bilaterally.   Skin: Warm and dry without trophic changes noted. There are no varicose veins.  Musculoskeletal: exam reveals no obvious joint deformities, tenderness or joint swelling or erythema.   Neurologically:  Mental status: The patient is awake, alert and oriented in all 4 spheres. Her memory, attention, language and knowledge are appropriate. There is no aphasia, agnosia, apraxia or anomia. Speech is clear with normal prosody and  enunciation. Thought process is linear. Mood is congruent and affect is normal.  Cranial nerves are as described above under HEENT exam. In addition, shoulder shrug is normal with equal shoulder height noted. Motor exam: Normal bulk, strength and tone is noted. There is no drift, tremor or rebound. Romberg is negative. Reflexes are 2+ throughout. Fine motor skills are intact.  Cerebellar testing shows no dysmetria or intention tremor on finger to nose testing. There is no truncal or gait ataxia.  Sensory exam is intact to light touch.  Gait, station and balance are unremarkable. No veering to one side is noted. No leaning to one side is noted. Posture is age-appropriate and stance is narrow based. No problems turning are noted. She turns en bloc.          Assessment and Plan:   In summary, Alicia [name] is a very pleasant 48 year old female with a history of obstructive sleep apnea (OSA), successfully treated with CPAP of 10 cm with excellent compliance, but a history of severe residual EDS. While she does have a FHx of narcolepsy with cataplexy in her father, her history is not very suggestive of narcolepsy. She has been using modafinil and has not yet started the Nuvigil 150 mg once daily but we do have a prior authorization approval for it. If she does well with the Nuvigil we will continue with CPAP treatment for her OSA and Nuvigil for residual daytime somnolence. I told her that the narcolepsy diagnosis or concern is not completely off the table and she has never had an MSLT and we can certainly consider this down the road, especially if symptomatic treatment of her sleepiness is not effective enough. Her physical exam is nonfocal. We talked again about maintaining a healthy lifestyle and weight loss. She is encouraged to try to eat healthy, exercise daily and keep well hydrated, to keep a scheduled bedtime and wake time routine, to not skip any meals and eat healthy snacks in between meals. I  suggested a six-month followup, sooner if the need arises and in the interim she is advised to call with any questions or concerns or refill requests. She was in agreement.  Most of my 25 minute visit today was spent in counseling and coordination of care, reviewing test results and reviewing medication.

## 2014-03-14 ENCOUNTER — Other Ambulatory Visit: Payer: Self-pay | Admitting: Neurology

## 2014-04-17 ENCOUNTER — Telehealth: Payer: Self-pay | Admitting: Neurology

## 2014-04-17 NOTE — Telephone Encounter (Signed)
Pt called states her medication Armodafinil (NUVIGIL) 150 MG tablet is causing her to have excessive urination and did not have this when she was on modafinil (PROVIGIL) 200 MG tablet. Pt wants to see if she can switch back to the Provigil or if she should decrease lower dosage or make a change of something else, pt did state the medication is working as far as sleeping but she is having the side effect from it. Please call pt concerning this matter. Thanks

## 2014-04-17 NOTE — Telephone Encounter (Signed)
Patient indicates since she has been on Nuvigil she has noticed she is urinating more frequently.  She would like to know if the dose should be decreased, or if she needs to be changed to Provigil instead.  Please advise.  Thank you.

## 2014-04-21 NOTE — Telephone Encounter (Signed)
I have never heard of increase in urination from Nuvigil.  is a possible that something else may be going on such as a UTI? if she thinks this is from the new medication we can certainly switch her to Provigil 200 mg strength, one pill once daily, #30 with 5 refills

## 2014-04-21 NOTE — Telephone Encounter (Signed)
I called the patient back.  Got no answer.  Left message. 

## 2014-08-21 ENCOUNTER — Encounter: Payer: Self-pay | Admitting: Neurology

## 2014-08-21 ENCOUNTER — Ambulatory Visit (INDEPENDENT_AMBULATORY_CARE_PROVIDER_SITE_OTHER): Payer: BC Managed Care – PPO | Admitting: Neurology

## 2014-08-21 VITALS — BP 118/73 | HR 68 | Temp 98.5°F | Resp 12 | Ht 66.0 in | Wt 206.0 lb

## 2014-08-21 DIAGNOSIS — Z9989 Dependence on other enabling machines and devices: Secondary | ICD-10-CM

## 2014-08-21 DIAGNOSIS — G4710 Hypersomnia, unspecified: Secondary | ICD-10-CM

## 2014-08-21 DIAGNOSIS — G4733 Obstructive sleep apnea (adult) (pediatric): Secondary | ICD-10-CM

## 2014-08-21 DIAGNOSIS — G4730 Sleep apnea, unspecified: Secondary | ICD-10-CM

## 2014-08-21 MED ORDER — ARMODAFINIL 150 MG PO TABS
150.0000 mg | ORAL_TABLET | Freq: Every day | ORAL | Status: DC
Start: 2014-08-21 — End: 2015-02-15

## 2014-08-21 MED ORDER — AMPHETAMINE-DEXTROAMPHETAMINE 10 MG PO TABS: 10.0000 mg | ORAL_TABLET | Freq: Every day | ORAL | Status: DC | PRN

## 2014-08-21 NOTE — Patient Instructions (Signed)
We will try as needed Adderall for residual sleepiness.

## 2014-08-21 NOTE — Progress Notes (Signed)
Subjective:    Patient ID: Alicia [name] is a 48 y.o. female.  HPI    Interim history:   Alicia [name] is a very pleasant 48 year old right-handed woman with an underlying medical history of allergies, hyperlipidemia, and prior Dx of obstructive sleep apnea on CPAP, who presents for followup consultation of her residual EDS despite full compliance with CPAP therapy for her severe obstructive sleep apnea. I last saw her on 02/13/2014, at which time she continued to endorse daytime somnolence. She had just been approved for Nuvigil but had not started it yet. She was fully compliant with CPAP therapy.  Today, she reports doing fairly well on Nuvigil 150 mg and it is better than generic Provigil as far as helping with her excessive daytime somnolence. nevertheless, she still has days where she has exacerbation of her somnolence. Sometimes especially if she has a big meals she will be very sleepy right after eating. Generally speaking she has no new complaints and is compliant with CPAP therapy. Today, I reviewed her compliance data from the compliance card on her machine: 07/22/2014 through 08/20/2014 which is a total of 30 days during which time she used her machine every night with percent used days greater than 4 hours of 97%, indicating excellent compliance, pressure still at 10 cm with EPR of 2. Residual AHI acceptable at 4.9 per hour and leak generally low with the 95th percentile of leak at 6.2 L/m, average usage of 7 hours and 19 minutes. She is compliant with her CPAP machine, she has a [employer] nasal pillows. She has exacerbation of her allergies and also facial eczema. She has recently seen a dermatologist and was given a new ointment.  I saw her on 08/15/2013, at which time we talked about her sleep test results from 2013 and her CPAP compliance which was excellent. She has a family history of narcolepsy with cataplexy in her father. She felt Provigil as needed was helpful. She had  trouble remembering the second dose of Provigil and therefore I suggested a trial of Nuvigil once daily. She was approved for this.   I first met her on 05/23/2013, at which time she reported being veyr sleepy despite being compliant with CPAP therapy. I suggested that she try Provigil to help stay awake during the day a little better, starting with 200 mg strength half a pill up to twice daily as needed. I advised her not to take it after 3 PM to avoid insomnia at night.  She has a FHx of narcolepsy with cataplexy in her father and was particularly concerned that she may have narcolepsy.   I had reviewed records including a recent CPAP compliance download from 04/02/2013 through 05/22/2013, total of 51 days, during which time she used CPAP every day. Percent used days greater than 4 hours was 96% indicating excellent compliance. Her CPAP pressure is 10 cm water pressure with an EPR of 2. Residual AHI is 4.7 indicating an appropriate pressure level. Average usage was 6:41 hours.   I reviewed a sleep study report from 11/10/2011. This was a split-night study. REM percentage was 17% and the baseline portion of the study. Her AHI was 61.9 per hour with an oxyhemoglobin desaturation nadir of 75%. She was titrated on CPAP from 4-10 cm of water pressure with elimination of her sleep disordered breathing reported.   She uses a nasal pillows mask. I also reviewed older compliance data from August to Nov. 2013, during which time she was very compliant but had  a high leak. At the time, she was using a FFM and leak was much better with the new mask.   Her Past Medical History Is Significant For: Past Medical History  Diagnosis Date  . Allergy   . Hypersomnia with sleep apnea, unspecified 05/23/2013  . OSA on CPAP 08/15/2013    Her Past Surgical History Is Significant For: Past Surgical History  Procedure Laterality Date  . Hysteroscopy w/d&c    . Wisdom tooth extraction      Her Family History Is  Significant For: Family History  Problem Relation Age of Onset  . Diabetes Father     type 2  . Depression Father   . Dementia Father   . Narcolepsy Father     cataplexy  . High Cholesterol Father   . Stroke Maternal Grandmother   . Heart disease Maternal Grandfather     Her Social History Is Significant For: History   Social History  . Marital Status: Single    Spouse Name: N/A    Number of Children: 0  . Years of Education: Post Grad   Occupational History  .      [employer]   Social History Main Topics  . Smoking status: Never Smoker   . Smokeless tobacco: Never Used  . Alcohol Use: No  . Drug Use: No  . Sexual Activity: No   Other Topics Concern  . None   Social History Narrative   Patient lives at home alone.   Caffeine Use: occasionally soda    Her Allergies Are:  Allergies  Allergen Reactions  . Latex   . Other     Seasonal   . Lubricants   :   Her Current Medications Are:  Outpatient Encounter Prescriptions as of 08/21/2014  Medication Sig  . Armodafinil (NUVIGIL) 150 MG tablet Take 1 tablet (150 mg total) by mouth daily. To be used with free 30 day trial offer and co-pay card. Has Prior Auth per [facility], Ref # [id number]  . b complex vitamins capsule Take 1 capsule by mouth daily.  . Bepotastine Besilate (BEPREVE) 1.5 % SOLN Place 1 drop into both eyes once.  . betamethasone valerate lotion (VALISONE) 0.1 % Apply 1 application topically daily.  . calcium carbonate 200 MG capsule Take 250 mg by mouth 2 (two) times daily with a meal.  . cholecalciferol (VITAMIN D) 1000 UNITS tablet Take 1,000 Units by mouth daily.  Marland Kitchen desonide (DESOWEN) 0.05 % lotion Apply 1 application topically 2 (two) times daily.  Marland Kitchen loratadine (CLARITIN) 10 MG tablet Take 10 mg by mouth daily.  . pimecrolimus (ELIDEL) 1 % cream Apply 1 application topically 2 (two) times daily.  Marland Kitchen pyridoxine (B-6) 100 MG tablet Take 100 mg by mouth daily.  . Tacrolimus (PROTOPIC EX) Apply  1 application topically.  . Triamcinolone Acetonide (NASACORT ALLERGY 24HR) 55 MCG/ACT AERO Place 2 sprays into the nose 2 (two) times daily.  . vitamin B-12 (CYANOCOBALAMIN) 100 MCG tablet Take 50 mcg by mouth daily.  . vitamin E 100 UNIT capsule Take 100 Units by mouth daily.  . modafinil (PROVIGIL) 200 MG tablet Take 0.5 tablets (100 mg total) by mouth 2 (two) times daily as needed. Do not take later than 3 PM.  . topiramate (TOPAMAX) 25 MG tablet Take 1 tablet by mouth as needed.  :  Review of Systems:  Out of a complete 14 point review of systems, all are reviewed and negative with the exception of these symptoms as  listed below:  Review of Systems  HENT:       Eye redness, eye itching  Genitourinary: Positive for frequency.  Skin: Positive for rash.       itching  Allergic/Immunologic: Positive for environmental allergies.  Neurological:        Apnea, daytime sleepiness    Objective:  Neurologic Exam  Physical Exam Physical Examination:   Filed Vitals:   08/21/14 1125  BP: 118/73  Pulse: 68  Temp: 98.5 F (36.9 C)  Resp: 12   General Examination: The patient is a very pleasant 48 y.o. female in no acute distress. She appears well-developed and well-nourished and very well groomed.  she is in good spirits today.   HEENT: Normocephalic, atraumatic, pupils are equal, round and reactive to light and accommodation.  funduscopic exam is normal. Extraocular tracking is good without limitation to gaze excursion or nystagmus noted. Normal smooth pursuit is noted. Hearing is grossly intact. Face is symmetric with normal facial animation and normal facial sensation. Speech is clear with no dysarthria noted. There is no hypophonia. There is no lip, neck/head, jaw or voice tremor. Neck is supple with full range of passive and active motion. There are no carotid bruits on auscultation. Oropharynx exam reveals: mild mouth dryness, good dental hygiene and moderate airway crowding, due to  elongated tongue and narrow airway entry. Mallampati is class I. Tongue protrudes centrally and palate elevates symmetrically. Tonsils are 1+. Neck size is 14.5 inches. Nasal inspection shows no significant deviated septum and mild mucosal bogginess. No significant erythema is noted. She does have mild erythema in the posterior pharynx. She sounds congested. Her nose is slightly red. She does have eczematous changes and mild swelling of her eyelids.   Chest: Clear to auscultation without wheezing, rhonchi or crackles noted.  Heart: S1+S2+0, regular and normal without murmurs, rubs or gallops noted.   Abdomen: Soft, non-tender and non-distended with normal bowel sounds appreciated on auscultation.  Extremities: There is no pitting edema in the distal lower extremities bilaterally.   Skin: Warm and dry without trophic changes noted. There are no varicose veins.  Musculoskeletal: exam reveals no obvious joint deformities, tenderness or joint swelling or erythema.   Neurologically:  Mental status: The patient is awake, alert and oriented in all 4 spheres. Her memory, attention, language and knowledge are appropriate. There is no aphasia, agnosia, apraxia or anomia. Speech is clear with normal prosody and enunciation. Thought process is linear. Mood is congruent and affect is normal.  Cranial nerves are as described above under HEENT exam. In addition, shoulder shrug is normal with equal shoulder height noted. Motor exam: Normal bulk, strength and tone is noted. There is no drift, tremor or rebound. Romberg is negative. Reflexes are 2+ throughout. Fine motor skills are intact.  Cerebellar testing shows no dysmetria or intention tremor on finger to nose testing. There is no truncal or gait ataxia.  Sensory exam is intact to light touch.  Gait, station and balance are unremarkable. No veering to one side is noted. No leaning to one side is noted. Posture is age-appropriate and stance is narrow based. No  problems turning are noted. She turns en bloc.          Assessment and Plan:   In summary, Alicia [name] is a very pleasant 48 year old female with a history of obstructive sleep apnea (OSA), successfully treated with CPAP of 10 cm with excellent compliance, but a history of severe residual EDS, recently improved with Nuvigil 150  mg strength. She has no side effects and tolerates it well. While she does have a FHx of narcolepsy with cataplexy in her father, her history is not very suggestive of narcolepsy. She  had used modafinil which did not work very well and she was approved for [employer]. She is advised to continue using her CPAP regularly. She is congratulated on her excellent compliance. She has some residual sleepiness on some days, especially after eating a meal. I would like for her to try as needed Adderall in low dose, 10 mg strength, use as needed. We talked about potential side effects and the nature of this medication, being a stimulant. She is agreeable to trying this. She will continue with Nuvigil at the current strength. I will see her back routinely in 6 months. Her physical exam is stable. She is advised to call with any interim questions, concerns, problems or updates. I gave her 2 prescriptions today as well as a co-pay card for Nuvigil.

## 2014-10-21 ENCOUNTER — Other Ambulatory Visit: Payer: Self-pay | Admitting: Neurology

## 2014-10-22 NOTE — Telephone Encounter (Signed)
A 6 month Rx was sent to this pharmacy on 10/29.  I called the pharmacy, and they were able to locate Rx.  Asked that we disregard this request.

## 2015-02-09 ENCOUNTER — Telehealth: Payer: Self-pay | Admitting: Neurology

## 2015-02-09 DIAGNOSIS — G4710 Hypersomnia, unspecified: Secondary | ICD-10-CM

## 2015-02-09 DIAGNOSIS — G4730 Sleep apnea, unspecified: Principal | ICD-10-CM

## 2015-02-09 MED ORDER — MODAFINIL 100 MG PO TABS
100.0000 mg | ORAL_TABLET | Freq: Two times a day (BID) | ORAL | Status: DC
Start: 2015-02-09 — End: 2015-03-30

## 2015-02-09 NOTE — Telephone Encounter (Signed)
Patient's Rx Nuvigil 150 mg is $600/month. Is there any other Rx that will work for her that is at a lower price?   Please call!

## 2015-02-09 NOTE — Telephone Encounter (Signed)
I called back.  Got no answer.  Left message.  

## 2015-02-09 NOTE — Telephone Encounter (Signed)
Although there is a prior [place] approval on file, patient indicates her co-pay for Ronne Binninguvigil will be several hundred dollars per fill.  She would like to know if a different medication can be recommended.  Please advise.  Thank you.  (Voucher has maximum benefit amount that would still not bring co-pay to affordable cost for her).

## 2015-02-09 NOTE — Telephone Encounter (Signed)
Pls advise her that instead of Nuvigil we can try Provigil 100 mg bid, generic, AM and around mid day. Avoid 2nd dose after 3 PM. Side effects include (but are not limited to): high blood pressure, headache, nervousness, palpitations, GI upset, tremor.  Rx done and ready to pick up when she is ready.

## 2015-02-10 ENCOUNTER — Telehealth: Payer: Self-pay

## 2015-02-10 NOTE — Telephone Encounter (Signed)
I spoke with patient and she is aware that her Provigil Rx is ready to p/u at the front desk.

## 2015-02-15 ENCOUNTER — Ambulatory Visit (INDEPENDENT_AMBULATORY_CARE_PROVIDER_SITE_OTHER): Payer: BLUE CROSS/BLUE SHIELD | Admitting: Family Medicine

## 2015-02-15 VITALS — BP 127/75 | HR 76 | Temp 98.2°F | Resp 16 | Ht 66.5 in | Wt 198.4 lb

## 2015-02-15 DIAGNOSIS — R05 Cough: Secondary | ICD-10-CM | POA: Diagnosis not present

## 2015-02-15 DIAGNOSIS — J0101 Acute recurrent maxillary sinusitis: Secondary | ICD-10-CM

## 2015-02-15 DIAGNOSIS — R059 Cough, unspecified: Secondary | ICD-10-CM

## 2015-02-15 MED ORDER — AMOXICILLIN-POT CLAVULANATE 875-125 MG PO TABS: 1.0000 | ORAL_TABLET | Freq: Two times a day (BID) | ORAL | Status: DC

## 2015-02-15 MED ORDER — HYDROCODONE-HOMATROPINE 5-1.5 MG/5ML PO SYRP: 5.0000 mL | ORAL_SOLUTION | Freq: Three times a day (TID) | ORAL | Status: DC | PRN

## 2015-02-15 NOTE — Patient Instructions (Signed)

## 2015-02-15 NOTE — Progress Notes (Signed)
Patient ID: Alicia [name], female   DOB: 1966/07/21, 49 y.o.   MRN: [id number]   This chart was scribed for Alicia Sidle, MD by [facility], medical scribe at [facility].The patient was seen in exam room 11 and the patient's care was started at 3:42 PM.  Patient ID: Alicia [name] MRN: [id number], DOB: 07-Jul-1966, 49 y.o. Date of Encounter: 02/15/2015  Primary Physician: Geraldo Pitter, MD  Chief Complaint:  Chief Complaint  Patient presents with   Cough   Sinus Problem   HPI:  Alicia [name] is a 49 y.o. female who presents to [facility] complaining of sinus congestion onset 3 weeks ago. She has a cough associated symptom. Her cough causes her pain in her right rib. She has tried dayquil, mussinex, nyquil for little relief.  Past Medical History  Diagnosis Date   Allergy    Hypersomnia with sleep apnea, unspecified 05/23/2013   OSA on CPAP 08/15/2013    Home Meds: Prior to Admission medications   Medication Sig Start Date End Date Taking? Authorizing Provider  b complex vitamins capsule Take 1 capsule by mouth daily.   Yes Historical Provider, MD  Bepotastine Besilate (BEPREVE) 1.5 % SOLN Place 1 drop into both eyes once.   Yes Historical Provider, MD  betamethasone valerate lotion (VALISONE) 0.1 % Apply 1 application topically daily. 05/31/13  Yes Historical Provider, MD  calcium carbonate 200 MG capsule Take 250 mg by mouth 2 (two) times daily with a meal.   Yes Historical Provider, MD  cholecalciferol (VITAMIN D) 1000 UNITS tablet Take 1,000 Units by mouth daily.   Yes Historical Provider, MD  desonide (DESOWEN) 0.05 % lotion Apply 1 application topically 2 (two) times daily.   Yes Historical Provider, MD  guaifenesin (ROBITUSSIN) 100 MG/5ML syrup Take 200 mg by mouth 3 (three) times daily as needed for cough.   Yes Historical Provider, MD  modafinil (PROVIGIL) 100 MG tablet Take 1 tablet (100 mg total) by mouth 2  (two) times daily. Second dose no later than 3 PM to avoid insomnia. This is in lieu of Nuvigil 02/09/15  Yes Huston Foley, MD  pimecrolimus (ELIDEL) 1 % cream Apply 1 application topically 2 (two) times daily.   Yes Historical Provider, MD  pyridoxine (B-6) 100 MG tablet Take 100 mg by mouth daily.   Yes Historical Provider, MD  Triamcinolone Acetonide (NASACORT ALLERGY 24HR) 55 MCG/ACT AERO Place 2 sprays into the nose 2 (two) times daily.   Yes Historical Provider, MD  vitamin B-12 (CYANOCOBALAMIN) 100 MCG tablet Take 50 mcg by mouth daily.   Yes Historical Provider, MD  vitamin E 100 UNIT capsule Take 100 Units by mouth daily.   Yes Historical Provider, MD  loratadine (CLARITIN) 10 MG tablet Take 10 mg by mouth daily.    Historical Provider, MD   Allergies:  Allergies  Allergen Reactions   Latex    Other     Seasonal    Lubricants     History   Social History   Marital Status: Single    Spouse Name: N/A   Number of Children: 0   Years of Education: Post Grad   Occupational History        Artist   Social History Main Topics   Smoking status: Never Smoker    Smokeless tobacco: Never Used   Alcohol Use: No   Drug Use: No   Sexual Activity: No   Other Topics Concern  Not on file   Social History Narrative   Patient lives at home alone.   Caffeine Use: occasionally soda    Review of Systems: Constitutional: negative for chills, fever, night sweats, weight changes, or fatigue  HEENT: negative for vision changes, hearing loss, rhinorrhea, ST, epistaxis, or sinus pressure. Positive for sinus congestion Cardiovascular: negative for chest pain or palpitations Respiratory: negative for hemoptysis, wheezing, shortness of breath. Positive for a cough. Abdominal: negative for abdominal pain, nausea, vomiting, diarrhea, or constipation Dermatological: negative for rash Neurologic: negative for headache, dizziness, or syncope All other systems reviewed  and are otherwise negative with the exception to those above and in the HPI.  Physical Exam: Blood pressure 127/75, pulse 76, temperature 98.2 F (36.8 C), temperature source Oral, resp. rate 16, height 5' 6.5" (1.689 m), weight 198 lb 6.4 oz (89.994 kg), last menstrual period 01/23/2015, SpO2 97 %., Body mass index is 31.55 kg/(m^2). General: Well developed, well nourished, in no acute distress. Head: Normocephalic, atraumatic, eyes without discharge, sclera non-icteric, nares are without discharge. Bilateral auditory canals clear, TM's are without perforation, pearly grey and translucent with reflective cone of light bilaterally. Oral cavity moist, posterior pharynx without exudate, erythema, peritonsillar abscess, or post nasal drip.  Neck: Supple. No thyromegaly. Full ROM. No lymphadenopathy. Lungs: Clear bilaterally to auscultation without wheezes, rales, or rhonchi. Breathing is unlabored. Heart: RRR with S1 S2. No murmurs, rubs, or gallops appreciated. Abdomen: Soft, non-tender, non-distended with normoactive bowel sounds. No hepatomegaly. No rebound/guarding. No obvious abdominal masses. Msk:  Strength and tone normal for age. Extremities/Skin: Warm and dry. No clubbing or cyanosis. No edema. No rashes or suspicious lesions. Neuro: Alert and oriented X 3. Moves all extremities spontaneously. Gait is normal. CNII-XII grossly in tact. Psych:  Responds to questions appropriately with a normal affect.   Labs:  ASSESSMENT AND PLAN:  49 y.o. year old female with sinusitis This chart was scribed in my presence and reviewed by me personally.    ICD-9-CM ICD-10-CM   1. Recurrent maxillary sinusitis, unspecified chronicity 461.0 J01.01 amoxicillin-clavulanate (AUGMENTIN) 875-125 MG per tablet  2. Cough 786.2 R05 HYDROcodone-homatropine (HYCODAN) 5-1.5 MG/5ML syrup   Signed, Alicia SidleKurt Lauenstein, MD 02/15/2015 3:42 PM

## 2015-03-30 ENCOUNTER — Telehealth: Payer: Self-pay | Admitting: Neurology

## 2015-03-30 MED ORDER — ARMODAFINIL 150 MG PO TABS: 150.0000 mg | ORAL_TABLET | Freq: Every day | ORAL | Status: DC

## 2015-03-30 NOTE — Telephone Encounter (Signed)
Patient called requesting to discontinue modafinil (PROVIGIL) 100 MG tablet and start Nuvigil. I worked better.  Insurance company requesting information before she can start taking medication. Please call and advise. Patient can be reached at [phone].

## 2015-03-30 NOTE — Telephone Encounter (Signed)
Patient would like to know if she can discontinue [facility] and [facility], as she feels it works better.  This is not covered by her ins and will require a prior auth.  I will be happy to contact them to try and obtain an approval, if change is permissible.  Please advise.  Thank you.

## 2015-03-30 NOTE — Telephone Encounter (Signed)
I called back to advise.  Patient is aware, and agreeable to this.  Explained she may try to use discount [place]cher from [email or web address] provided her ins does not restrict the use of this card.  She will bring in a copy of the letter she got in the mail from ins regarding coverage changes for this med.  Rx has been added to med list and phoned into the pharmacy.  As well, Modafinil was removed from med list.  Ins has been contacted and provided with clinical info.  Request is currently under review Ref Key: [id number]

## 2015-03-30 NOTE — Telephone Encounter (Signed)
Okay to switch to Nuvigil 150 mg once daily, #30 w 5 refills. As I recall, we tried this last year.

## 2015-04-02 NOTE — Telephone Encounter (Signed)
[employer] [place] has approved the request for coverage on Nuvigil effective until 10/23/2038 or until the policy changes or is terminated Ref # [id number] They indicate they have notified the patient of this decision as well.

## 2015-04-13 ENCOUNTER — Ambulatory Visit (INDEPENDENT_AMBULATORY_CARE_PROVIDER_SITE_OTHER): Payer: BLUE CROSS/BLUE SHIELD | Admitting: Family Medicine

## 2015-04-13 VITALS — BP 110/70 | HR 79 | Temp 99.5°F | Resp 18 | Ht 66.0 in | Wt 199.5 lb

## 2015-04-13 DIAGNOSIS — T7840XA Allergy, unspecified, initial encounter: Secondary | ICD-10-CM | POA: Diagnosis not present

## 2015-04-13 DIAGNOSIS — L02419 Cutaneous abscess of limb, unspecified: Secondary | ICD-10-CM

## 2015-04-13 DIAGNOSIS — L03119 Cellulitis of unspecified part of limb: Secondary | ICD-10-CM

## 2015-04-13 MED ORDER — PREDNISONE 20 MG PO TABS: ORAL_TABLET | ORAL | Status: DC

## 2015-04-13 MED ORDER — DOXYCYCLINE HYCLATE 100 MG PO TABS: 100.0000 mg | ORAL_TABLET | Freq: Two times a day (BID) | ORAL | Status: DC

## 2015-04-13 NOTE — Progress Notes (Signed)
Patient ID: Alicia [name], female   DOB: 14-Feb-1966, 49 y.o.   MRN: [id number]   This chart was scribed for Elvina Sidle, MD by [facility], medical scribe at [facility].The patient was seen in exam room 11 and the patient's care was started at 7:44 PM.  Patient ID: Alicia [name] MRN: [id number], DOB: [birth year], 49 y.o. Date of Encounter: 04/13/2015  Primary Physician: Geraldo Pitter, MD  Chief Complaint:  Chief Complaint  Patient presents with  . Insect Bite    was stung Saturday afternoon by 2 yellow jackets on posterior of right thigh & posterior lower left leg   HPI:  Alicia [name] is a 49 y.o. female who presents to [facility] complaining of two sting bites on her left calf and right inner thigh two days ago while working in her yard. She has been using 1% hydrocortisone cream for no relief. She works outside frequently.   Past Medical History  Diagnosis Date  . Allergy   . Hypersomnia with sleep apnea, unspecified 05/23/2013  . OSA on CPAP 08/15/2013    Home Meds: Prior to Admission medications   Medication Sig Start Date End Date Taking? Authorizing Provider  Armodafinil 150 MG tablet Take 1 tablet (150 mg total) by mouth daily. 03/30/15  Yes Huston Foley, MD  b complex vitamins capsule Take 1 capsule by mouth daily.   Yes Historical Provider, MD  Bepotastine Besilate (BEPREVE) 1.5 % SOLN Place 1 drop into both eyes once.   Yes Historical Provider, MD  betamethasone valerate lotion (VALISONE) 0.1 % Apply 1 application topically daily. 05/31/13  Yes Historical Provider, MD  calcium carbonate 200 MG capsule Take 250 mg by mouth 2 (two) times daily with a meal.   Yes Historical Provider, MD  cholecalciferol (VITAMIN D) 1000 UNITS tablet Take 1,000 Units by mouth daily.   Yes Historical Provider, MD  desonide (DESOWEN) 0.05 % lotion Apply 1 application topically 2 (two) times daily.   Yes Historical Provider, MD    fexofenadine (ALLEGRA) 180 MG tablet Take 180 mg by mouth daily.   Yes Historical Provider, MD  pimecrolimus (ELIDEL) 1 % cream Apply 1 application topically 2 (two) times daily.   Yes Historical Provider, MD  pyridoxine (B-6) 100 MG tablet Take 100 mg by mouth daily.   Yes Historical Provider, MD  Triamcinolone Acetonide (NASACORT ALLERGY 24HR) 55 MCG/ACT AERO Place 2 sprays into the nose 2 (two) times daily.   Yes Historical Provider, MD  vitamin B-12 (CYANOCOBALAMIN) 100 MCG tablet Take 50 mcg by mouth daily.   Yes Historical Provider, MD  vitamin E 100 UNIT capsule Take 100 Units by mouth daily.   Yes Historical Provider, MD   Allergies:  Allergies  Allergen Reactions  . Latex   . Other     Seasonal   . Lubricants    History   Social History  . Marital Status: Single    Spouse Name: N/A  . Number of Children: 0  . Years of Education: Post Grad   Occupational History  .      [employer]   Social History Main Topics  . Smoking status: Never Smoker   . Smokeless tobacco: Never Used  . Alcohol Use: No  . Drug Use: No  . Sexual Activity: No   Other Topics Concern  . Not on file   Social History Narrative   Patient lives at home alone.   Caffeine Use: occasionally soda  Review of Systems: Constitutional: negative for chills, fever, night sweats, weight changes, or fatigue  HEENT: negative for vision changes, hearing loss, congestion, rhinorrhea, ST, epistaxis, or sinus pressure Cardiovascular: negative for chest pain or palpitations Respiratory: negative for hemoptysis, wheezing, shortness of breath, or cough Abdominal: negative for abdominal pain, nausea, vomiting, diarrhea, or constipation Dermatological: negative for rash. Positive for insect bite. Neurologic: negative for headache, dizziness, or syncope All other systems reviewed and are otherwise negative with the exception to those above and in the HPI.  Physical Exam: Blood pressure 110/70, pulse  79, temperature 99.5 F (37.5 C), temperature source Oral, resp. rate 18, height 5\' 6"  (1.676 m), weight 199 lb 8 oz (90.493 kg), SpO2 98 %., Body mass index is 32.22 kg/(m^2). General: Well developed, well nourished, in no acute distress. Head: Normocephalic, atraumatic, eyes without discharge, sclera non-icteric, nares are without discharge. Bilateral auditory canals clear, TM's are without perforation, pearly grey and translucent with reflective cone of light bilaterally. Oral cavity moist, posterior pharynx without exudate, erythema, peritonsillar abscess, or post nasal drip.  Neck: Supple. No thyromegaly. Full ROM. No lymphadenopathy. Lungs: Clear bilaterally to auscultation without wheezes, rales, or rhonchi. Breathing is unlabored. Heart: RRR with S1 S2. No murmurs, rubs, or gallops appreciated. Abdomen: Soft, non-tender, non-distended with normoactive bowel sounds. No hepatomegaly. No rebound/guarding. No obvious abdominal masses. Msk:  Strength and tone normal for age. Extremities/Skin: There are areas of central induration vesiculation and erythema that measure 24 x 30 cm up and down on the left. 40 x 20 cm on the right. Neuro: Alert and oriented X 3. Moves all extremities spontaneously. Gait is normal. CNII-XII grossly in tact. Psych:  Responds to questions appropriately with a normal affect.    ASSESSMENT AND PLAN:  49 y.o. year old female with  This chart was scribed in my presence and reviewed by me personally.    ICD-9-CM ICD-10-CM   1. Cellulitis and abscess of leg 682.6 L02.419 predniSONE (DELTASONE) 20 MG tablet    L03.119 doxycycline (VIBRA-TABS) 100 MG tablet  2. Allergic reaction, initial encounter 995.3 T78.40XA predniSONE (DELTASONE) 20 MG tablet   Signed, Elvina Sidle, MD 04/13/2015 7:44 PM

## 2015-12-31 ENCOUNTER — Encounter: Payer: Self-pay | Admitting: Podiatry

## 2015-12-31 ENCOUNTER — Ambulatory Visit (INDEPENDENT_AMBULATORY_CARE_PROVIDER_SITE_OTHER): Payer: BLUE CROSS/BLUE SHIELD | Admitting: Podiatry

## 2015-12-31 ENCOUNTER — Ambulatory Visit (INDEPENDENT_AMBULATORY_CARE_PROVIDER_SITE_OTHER): Payer: BLUE CROSS/BLUE SHIELD

## 2015-12-31 VITALS — BP 113/69 | HR 63 | Resp 16 | Ht 65.0 in | Wt 205.0 lb

## 2015-12-31 DIAGNOSIS — M722 Plantar fascial fibromatosis: Secondary | ICD-10-CM

## 2015-12-31 MED ORDER — TRIAMCINOLONE ACETONIDE 10 MG/ML IJ SUSP
10.0000 mg | Freq: Once | INTRAMUSCULAR | Status: AC
Start: 2015-12-31 — End: 2015-12-31
  Administered 2015-12-31: 10 mg

## 2015-12-31 NOTE — Patient Instructions (Signed)

## 2015-12-31 NOTE — Progress Notes (Signed)
Subjective:     Patient ID: Alicia [name], female   DOB: 1966-01-24, 50 y.o.   MRN: [id number]  HPI patient states I've had a lot of pain in the bottom of my left heel and it has been going on for at least a month. I'm trying to be active and it's making it very difficult   Review of Systems  All other systems reviewed and are negative.      Objective:   Physical Exam  Constitutional: She is oriented to person, place, and time.  Cardiovascular: Intact distal pulses.   Musculoskeletal: Normal range of motion.  Neurological: She is oriented to person, place, and time.  Skin: Skin is warm.  Nursing note and vitals reviewed.  neurovascular status intact muscle strength adequate range of motion within normal limits with inflammation and pain around the plantar heel left at its insertion into the calcaneus. Patient's found to have good digital perfusion is well oriented 3 and has moderate depression of the arch with history of orthotics which are no longer supporting her properly     Assessment:     Chronic plantar fasciitis left with inflammation and structural changes within the arch left over right    Plan:     H&P and x-rays reviewed with patient. Injected the left plantar fascia today 3 Milligan Kenalog 5 mill grams Xylocaine and applied fascial brace to lift the arch. I then went ahead and I scanned for custom orthotics to reduce plantar pressures

## 2015-12-31 NOTE — Progress Notes (Signed)
   Subjective:    Patient ID: Alicia [name], female    DOB: 1965-10-30, 50 y.o.   MRN: [id number]  HPI Patient presents with foot pain in their left foot; heel & lateral side. Pt stated, "has more pain in the morning"; x1 month.  Pt also presents with needing to get orthotics made today.   Review of Systems  HENT: Positive for sneezing.   Eyes: Positive for itching.  Musculoskeletal: Positive for back pain.  All other systems reviewed and are negative.      Objective:   Physical Exam        Assessment & Plan:

## 2016-01-26 ENCOUNTER — Ambulatory Visit: Payer: BLUE CROSS/BLUE SHIELD | Admitting: *Deleted

## 2016-01-26 DIAGNOSIS — M722 Plantar fascial fibromatosis: Secondary | ICD-10-CM

## 2016-01-26 NOTE — Patient Instructions (Signed)

## 2016-01-26 NOTE — Progress Notes (Signed)
Patient ID: Alicia [name], female   DOB: [birth year], 50 y.o.   MRN: [id number] Patient presents for orthotic pick up.  Verbal and written break in and wear instructions given.  Patient will follow up in 4 weeks if symptoms worsen or fail to improve.

## 2016-01-28 DIAGNOSIS — J301 Allergic rhinitis due to pollen: Secondary | ICD-10-CM | POA: Diagnosis not present

## 2016-01-28 DIAGNOSIS — J3089 Other allergic rhinitis: Secondary | ICD-10-CM | POA: Diagnosis not present

## 2016-02-01 DIAGNOSIS — J301 Allergic rhinitis due to pollen: Secondary | ICD-10-CM | POA: Diagnosis not present

## 2016-02-02 DIAGNOSIS — N6001 Solitary cyst of right breast: Secondary | ICD-10-CM | POA: Diagnosis not present

## 2016-02-09 DIAGNOSIS — J301 Allergic rhinitis due to pollen: Secondary | ICD-10-CM | POA: Diagnosis not present

## 2016-02-09 DIAGNOSIS — J3089 Other allergic rhinitis: Secondary | ICD-10-CM | POA: Diagnosis not present

## 2016-02-12 DIAGNOSIS — J3089 Other allergic rhinitis: Secondary | ICD-10-CM | POA: Diagnosis not present

## 2016-02-12 DIAGNOSIS — J301 Allergic rhinitis due to pollen: Secondary | ICD-10-CM | POA: Diagnosis not present

## 2016-02-22 DIAGNOSIS — J3089 Other allergic rhinitis: Secondary | ICD-10-CM | POA: Diagnosis not present

## 2016-02-22 DIAGNOSIS — J301 Allergic rhinitis due to pollen: Secondary | ICD-10-CM | POA: Diagnosis not present

## 2016-02-23 DIAGNOSIS — Z01411 Encounter for gynecological examination (general) (routine) with abnormal findings: Secondary | ICD-10-CM | POA: Diagnosis not present

## 2016-02-23 DIAGNOSIS — N72 Inflammatory disease of cervix uteri: Secondary | ICD-10-CM | POA: Diagnosis not present

## 2016-02-23 DIAGNOSIS — Z6832 Body mass index (BMI) 32.0-32.9, adult: Secondary | ICD-10-CM | POA: Diagnosis not present

## 2016-02-23 DIAGNOSIS — Z124 Encounter for screening for malignant neoplasm of cervix: Secondary | ICD-10-CM | POA: Diagnosis not present

## 2016-03-03 DIAGNOSIS — J301 Allergic rhinitis due to pollen: Secondary | ICD-10-CM | POA: Diagnosis not present

## 2016-03-03 DIAGNOSIS — J3089 Other allergic rhinitis: Secondary | ICD-10-CM | POA: Diagnosis not present

## 2016-03-18 DIAGNOSIS — J3089 Other allergic rhinitis: Secondary | ICD-10-CM | POA: Diagnosis not present

## 2016-03-18 DIAGNOSIS — J301 Allergic rhinitis due to pollen: Secondary | ICD-10-CM | POA: Diagnosis not present

## 2016-03-25 DIAGNOSIS — J3089 Other allergic rhinitis: Secondary | ICD-10-CM | POA: Diagnosis not present

## 2016-03-25 DIAGNOSIS — J301 Allergic rhinitis due to pollen: Secondary | ICD-10-CM | POA: Diagnosis not present

## 2016-04-11 DIAGNOSIS — Z6833 Body mass index (BMI) 33.0-33.9, adult: Secondary | ICD-10-CM | POA: Diagnosis not present

## 2016-04-11 DIAGNOSIS — R5383 Other fatigue: Secondary | ICD-10-CM | POA: Diagnosis not present

## 2016-04-11 DIAGNOSIS — M4722 Other spondylosis with radiculopathy, cervical region: Secondary | ICD-10-CM | POA: Diagnosis not present

## 2016-04-11 DIAGNOSIS — G894 Chronic pain syndrome: Secondary | ICD-10-CM | POA: Diagnosis not present

## 2016-04-11 DIAGNOSIS — M542 Cervicalgia: Secondary | ICD-10-CM | POA: Diagnosis not present

## 2016-04-11 DIAGNOSIS — M50322 Other cervical disc degeneration at C5-C6 level: Secondary | ICD-10-CM | POA: Diagnosis not present

## 2016-04-11 DIAGNOSIS — R635 Abnormal weight gain: Secondary | ICD-10-CM | POA: Diagnosis not present

## 2016-04-11 DIAGNOSIS — E782 Mixed hyperlipidemia: Secondary | ICD-10-CM | POA: Diagnosis not present

## 2016-04-20 DIAGNOSIS — R635 Abnormal weight gain: Secondary | ICD-10-CM | POA: Diagnosis not present

## 2016-04-20 DIAGNOSIS — E782 Mixed hyperlipidemia: Secondary | ICD-10-CM | POA: Diagnosis not present

## 2016-04-20 DIAGNOSIS — R5383 Other fatigue: Secondary | ICD-10-CM | POA: Diagnosis not present

## 2016-04-21 DIAGNOSIS — M4802 Spinal stenosis, cervical region: Secondary | ICD-10-CM | POA: Diagnosis not present

## 2016-04-21 DIAGNOSIS — G894 Chronic pain syndrome: Secondary | ICD-10-CM | POA: Diagnosis not present

## 2016-04-21 DIAGNOSIS — M5032 Other cervical disc degeneration, mid-cervical region, unspecified level: Secondary | ICD-10-CM | POA: Diagnosis not present

## 2016-04-21 DIAGNOSIS — M542 Cervicalgia: Secondary | ICD-10-CM | POA: Diagnosis not present

## 2016-05-02 DIAGNOSIS — E559 Vitamin D deficiency, unspecified: Secondary | ICD-10-CM | POA: Diagnosis not present

## 2016-05-02 DIAGNOSIS — G894 Chronic pain syndrome: Secondary | ICD-10-CM | POA: Diagnosis not present

## 2016-05-02 DIAGNOSIS — E038 Other specified hypothyroidism: Secondary | ICD-10-CM | POA: Diagnosis not present

## 2016-05-02 DIAGNOSIS — E663 Overweight: Secondary | ICD-10-CM | POA: Diagnosis not present

## 2016-05-11 DIAGNOSIS — J301 Allergic rhinitis due to pollen: Secondary | ICD-10-CM | POA: Diagnosis not present

## 2016-05-11 DIAGNOSIS — J3089 Other allergic rhinitis: Secondary | ICD-10-CM | POA: Diagnosis not present

## 2016-05-20 DIAGNOSIS — R21 Rash and other nonspecific skin eruption: Secondary | ICD-10-CM | POA: Diagnosis not present

## 2016-05-20 DIAGNOSIS — J3089 Other allergic rhinitis: Secondary | ICD-10-CM | POA: Diagnosis not present

## 2016-05-20 DIAGNOSIS — J301 Allergic rhinitis due to pollen: Secondary | ICD-10-CM | POA: Diagnosis not present

## 2016-05-20 DIAGNOSIS — J3081 Allergic rhinitis due to animal (cat) (dog) hair and dander: Secondary | ICD-10-CM | POA: Diagnosis not present

## 2016-06-10 DIAGNOSIS — J301 Allergic rhinitis due to pollen: Secondary | ICD-10-CM | POA: Diagnosis not present

## 2016-06-10 DIAGNOSIS — J3089 Other allergic rhinitis: Secondary | ICD-10-CM | POA: Diagnosis not present

## 2016-06-17 DIAGNOSIS — J301 Allergic rhinitis due to pollen: Secondary | ICD-10-CM | POA: Diagnosis not present

## 2016-06-17 DIAGNOSIS — J3089 Other allergic rhinitis: Secondary | ICD-10-CM | POA: Diagnosis not present

## 2016-06-30 DIAGNOSIS — J3089 Other allergic rhinitis: Secondary | ICD-10-CM | POA: Diagnosis not present

## 2016-06-30 DIAGNOSIS — J301 Allergic rhinitis due to pollen: Secondary | ICD-10-CM | POA: Diagnosis not present

## 2016-07-06 ENCOUNTER — Telehealth: Payer: Self-pay

## 2016-07-06 DIAGNOSIS — Z23 Encounter for immunization: Secondary | ICD-10-CM | POA: Diagnosis not present

## 2016-07-06 NOTE — Telephone Encounter (Signed)
LM for patient to bring CPAP machine to appt tomorrow so we can get a download.

## 2016-07-07 ENCOUNTER — Ambulatory Visit (INDEPENDENT_AMBULATORY_CARE_PROVIDER_SITE_OTHER): Payer: BLUE CROSS/BLUE SHIELD | Admitting: Neurology

## 2016-07-07 ENCOUNTER — Encounter: Payer: Self-pay | Admitting: Neurology

## 2016-07-07 VITALS — BP 110/76 | HR 76 | Resp 16 | Ht 66.0 in | Wt 199.0 lb

## 2016-07-07 DIAGNOSIS — G4730 Sleep apnea, unspecified: Secondary | ICD-10-CM | POA: Diagnosis not present

## 2016-07-07 DIAGNOSIS — G4710 Hypersomnia, unspecified: Secondary | ICD-10-CM | POA: Diagnosis not present

## 2016-07-07 DIAGNOSIS — G4733 Obstructive sleep apnea (adult) (pediatric): Secondary | ICD-10-CM

## 2016-07-07 DIAGNOSIS — Z9989 Dependence on other enabling machines and devices: Secondary | ICD-10-CM

## 2016-07-07 MED ORDER — AMPHETAMINE-DEXTROAMPHETAMINE 10 MG PO TABS: 10.0000 mg | ORAL_TABLET | Freq: Every day | ORAL | 0 refills | Status: DC

## 2016-07-07 NOTE — Progress Notes (Signed)
Subjective:    Patient ID: Alicia [name] is a 50 y.o. female.  HPI     Interim history:  Alicia [name] is a very pleasant 50 year old right-handed woman with an underlying medical history of allergies, hyperlipidemia, and prior Dx of obstructive sleep apnea on CPAP, who presents for followup consultation of her hypersomnolence in the context of sleep apnea with CPAP therapy. The patient is unaccompanied today. She presents after a long gap. I last saw her on 08/21/2014, at which time she reported doing well on Nuvigil 150 mg once daily and she felt that it was better than the Provigil generic. She had some residual sleepiness but felt improved overall. She was compliant with CPAP therapy. I suggested we try low-dose Adderall as needed for residual sleepiness.  Today, 07/07/2016: I reviewed her CPAP compliance data from 06/07/2016 through 07/06/2016 which is a total of 30 days, during which time she used her machine every night with percent used days greater than 4 hours at 100%, indicating superb compliance with an average usage of 7 hours and 4 minutes, residual AHI 4.1 per hour, leak low for the 95th percentile at 2.6 L/m on a pressure of 10 cm with EPR of 2.  Today, 07/07/2016: She reports Doing okay, weight has been fluctuating, she tends to overeat and then tries to lose weight, goes to the gym on a fairly regular basis though. She is compliant with CPAP, she is wondering if she needs a new sleep study, she is sleepy during the day especially in the afternoon, particularly after eating a carb laden meal. Other than that, she has been stable, takes a vitamin D supplement and other supplements, vitamin D was low per her report and she was told to start taking over-the-counter per PCP. She never actually tried the Adderall that I suggested last time, stopped taking the Nuvigil even though it worked okay because of was too expensive.  Previously:   I saw her on 02/13/2014, at which time  she continued to endorse daytime somnolence. She had just been approved for Nuvigil but had not started it yet. She was fully compliant with CPAP therapy.   I reviewed her compliance data from the compliance card on her machine: 07/22/2014 through 08/20/2014 which is a total of 30 days during which time she used her machine every night with percent used days greater than 4 hours of 97%, indicating excellent compliance, pressure still at 10 cm with EPR of 2. Residual AHI acceptable at 4.9 per hour and leak generally low with the 95th percentile of leak at 6.2 L/m, average usage of 7 hours and 19 minutes. She is compliant with her CPAP machine, she has a F[employer]P Pillaro nasal pillows. She has exacerbation of her allergies and also facial eczema. She has recently seen a dermatologist and was given a new ointment.   I saw her on 08/15/2013, at which time we talked about her sleep test results from 2013 and her CPAP compliance which was excellent. She has a family history of narcolepsy with cataplexy in her father. She felt Provigil as needed was helpful. She had trouble remembering the second dose of Provigil and therefore I suggested a trial of Nuvigil once daily. She was approved for this.    I first met her on 05/23/2013, at which time she reported being veyr sleepy despite being compliant with CPAP therapy. I suggested that she try Provigil to help stay awake during the day a little better, starting with 200 mg strength half  a pill up to twice daily as needed. I advised her not to take it after 3 PM to avoid insomnia at night.  She has a FHx of narcolepsy with cataplexy in her father and was particularly concerned that she may have narcolepsy.    I had reviewed records including a recent CPAP compliance download from 04/02/2013 through 05/22/2013, total of 51 days, during which time she used CPAP every day. Percent used days greater than 4 hours was 96% indicating excellent compliance. Her CPAP pressure is 10  cm water pressure with an EPR of 2. Residual AHI is 4.7 indicating an appropriate pressure level. Average usage was 6:41 hours.    I reviewed a sleep study report from 11/10/2011. This was a split-night study. REM percentage was 17% and the baseline portion of the study. Her AHI was 61.9 per hour with an oxyhemoglobin desaturation nadir of 75%. She was titrated on CPAP from 4-10 cm of water pressure with elimination of her sleep disordered breathing reported.    She uses a nasal pillows mask. I also reviewed older compliance data from August to Nov. 2013, during which time she was very compliant but had a high leak. At the time, she was using a FFM and leak was much better with the new mask.    Her Past Medical History Is Significant For: Past Medical History:  Diagnosis Date  . Allergy   . Hypersomnia with sleep apnea, unspecified 05/23/2013  . OSA on CPAP 08/15/2013    Her Past Surgical History Is Significant For: Past Surgical History:  Procedure Laterality Date  . HYSTEROSCOPY W/D&C    . WISDOM TOOTH EXTRACTION      Her Family History Is Significant For: Family History  Problem Relation Age of Onset  . Diabetes Father     type 2  . Depression Father   . Dementia Father   . Narcolepsy Father     cataplexy  . High Cholesterol Father   . Stroke Maternal Grandmother   . Heart disease Maternal Grandfather     Her Social History Is Significant For: Social History   Social History  . Marital status: Single    Spouse name: N/A  . Number of children: 0  . Years of education: Post Grad   Occupational History  .      [employer]   Social History Main Topics  . Smoking status: Never Smoker  . Smokeless tobacco: Never Used  . Alcohol use No  . Drug use: No  . Sexual activity: No   Other Topics Concern  . None   Social History Narrative   Patient lives at home alone.   Caffeine Use: occasionally soda    Her Allergies Are:  Allergies  Allergen Reactions   . Latex   . Other     Seasonal   . Lubricants   :   Her Current Medications Are:  Outpatient Encounter Prescriptions as of 07/07/2016  Medication Sig  . b complex vitamins capsule Take 1 capsule by mouth daily.  . Bepotastine Besilate (BEPREVE) 1.5 % SOLN Place 1 drop into both eyes once.  . betamethasone valerate lotion (VALISONE) 0.1 % Apply 1 application topically daily.  . calcium carbonate 200 MG capsule Take 250 mg by mouth 2 (two) times daily with a meal.  . cholecalciferol (VITAMIN D) 1000 UNITS tablet Take 1,000 Units by mouth daily.  Marland Kitchen desonide (DESOWEN) 0.05 % lotion Apply 1 application topically 2 (two) times daily.  Marland Kitchen doxycycline (VIBRA-TABS)  100 MG tablet Take 1 tablet (100 mg total) by mouth 2 (two) times daily.  . fexofenadine (ALLEGRA) 180 MG tablet Take 180 mg by mouth daily.  . pimecrolimus (ELIDEL) 1 % cream Apply 1 application topically 2 (two) times daily.  Marland Kitchen pyridoxine (B-6) 100 MG tablet Take 100 mg by mouth daily.  . Triamcinolone Acetonide (NASACORT ALLERGY 24HR) 55 MCG/ACT AERO Place 2 sprays into the nose 2 (two) times daily.  . vitamin B-12 (CYANOCOBALAMIN) 100 MCG tablet Take 50 mcg by mouth daily.  . vitamin E 100 UNIT capsule Take 100 Units by mouth daily.  . [DISCONTINUED] Armodafinil 150 MG tablet Take 1 tablet (150 mg total) by mouth daily.   No facility-administered encounter medications on file as of 07/07/2016.   :  Review of Systems:  Out of a complete 14 point review of systems, all are reviewed and negative with the exception of these symptoms as listed below:  Review of Systems  Neurological:       Patient is asking if she is due for a new sleep study?  Patient is not taking Armodafinil, states that it worked well but too expensive.     Objective:  Neurologic Exam  Physical Exam Physical Examination:   Vitals:   07/07/16 0834  BP: 110/76  Pulse: 76  Resp: 16   General Examination: The patient is a very pleasant 50 y.o. female  in no acute distress. She appears well-developed and well-nourished and very well groomed.  she is in good spirits today.   HEENT: Normocephalic, atraumatic, pupils are equal, round and reactive to light and accommodation.  funduscopic exam is normal. Extraocular tracking is good without limitation to gaze excursion or nystagmus noted. Normal smooth pursuit is noted. Hearing is grossly intact. Face is symmetric with normal facial animation and normal facial sensation. Speech is clear with no dysarthria noted. There is no hypophonia. There is no lip, neck/head, jaw or voice tremor. Neck is supple with full range of passive and active motion. There are no carotid bruits on auscultation. Oropharynx exam reveals: mild mouth dryness, good dental hygiene and moderate airway crowding, due to elongated tongue and narrow airway entry. Mallampati is class I. Tongue protrudes centrally and palate elevates symmetrically. Tonsils are 1+. She sounds congested. Her nose is slightly red.   Chest: Clear to auscultation without wheezing, rhonchi or crackles noted.  Heart: S1+S2+0, regular and normal without murmurs, rubs or gallops noted.   Abdomen: Soft, non-tender and non-distended with normal bowel sounds appreciated on auscultation.  Extremities: There is no pitting edema in the distal lower extremities bilaterally.   Skin: Warm and dry without trophic changes noted. There are no varicose veins.  Musculoskeletal: exam reveals no obvious joint deformities, tenderness or joint swelling or erythema.   Neurologically:  Mental status: The patient is awake, alert and oriented in all 4 spheres. Her memory, attention, language and knowledge are appropriate. There is no aphasia, agnosia, apraxia or anomia. Speech is clear with normal prosody and enunciation. Thought process is linear. Mood is congruent and affect is normal.  Cranial nerves are as described above under HEENT exam. In addition, shoulder shrug is normal  with equal shoulder height noted. Motor exam: Normal bulk, strength and tone is noted. There is no drift, tremor or rebound. Romberg is negative. Reflexes are 2+ throughout. Fine motor skills are intact.  Cerebellar testing shows no dysmetria or intention tremor on finger to nose testing. There is no truncal or gait ataxia.  Sensory  exam is intact to light touch.  Gait, station and balance are unremarkable. No veering to one side is noted. No leaning to one side is noted. Posture is age-appropriate and stance is narrow based.  Assessment and Plan:   In summary, Alicia [name] is a very pleasant 50 year old female with a history of Severe obstructive sleep apnea as determined by a split-night sleep study on 11/10/2011 which showed an AHI of 61.9 per hour, with desaturation nadir of 75%. She was noted to have moderate snoring at the time. [facility] and interpreted by Dr. [name] at the time. She reported an ESS of 16 out of 24 at the time, BMI was 30.8 at the time. She was titrated on CPAP from 4 cm to 10 cm. She was given a Quattro full facemask. She has been compliant with CPAP therapy but had residual sleepiness which improved on Nuvigil but she stopped taking it as it was too costly. Modafinil unfortunately did not work very well. Physical exam is stable, she presents after Nearly 2 years. She is wondering if she needs another sleep study. She has a history of severe obstructive sleep apnea and is well treated with CPAP at this time, while we can certainly reevaluate her sleep apnea, I explained to her that her CPAP download suggests adequate treatment of her sleep apnea with the current mask and setting. Current BMI is 32.1. Weight has been fairly stable since her last visit, about 7 pounds less since I last saw her in October 2015. She is commended for her superb treatment adherence. For her residual EDS, I suggested adderall 10 mg at 1 or 2 PM. Of  note, she has a family history of narcolepsy with cataplexy in her father and her history is not suggestive of narcolepsy, sleep study previously in 2013 showed a REM percentage of 17% and REM latency of 102 minutes. She was approved for Nuvigil. She is advised to continue using her CPAP regularly. Her residual sleepiness is worse after eating a meal. We talked about potential side effects and the nature of this medication, being a stimulant. She is agreeable to trying this. I will see her back routinely in 6 months.  I spent 25 minutes in total face-to-face time with the patient, more than 50% of which was spent in counseling and coordination of care, reviewing test results, reviewing medication and discussing or reviewing the diagnosis of OSA and EDS, the prognosis and treatment options.

## 2016-07-07 NOTE — Patient Instructions (Addendum)
Please continue using your CPAP regularly. While your insurance requires that you use CPAP at least 4 hours each night on 70% of the nights, I recommend, that you not skip any nights and use it throughout the night if you can. Getting used to CPAP and staying with the treatment long term does take time and patience and discipline. Untreated obstructive sleep apnea when it is moderate to severe can have an adverse impact on cardiovascular health and raise her risk for heart disease, arrhythmias, hypertension, congestive heart failure, stroke and diabetes. Untreated obstructive sleep apnea causes sleep disruption, nonrestorative sleep, and sleep deprivation. This can have an impact on your day to day functioning and cause daytime sleepiness and impairment of cognitive function, memory loss, mood disturbance, and problems focussing. Using CPAP regularly can improve these symptoms.  We will try adderall 10 mg once daily, take around 1 to 2 PM. This may help your afternoon sleepiness.   We may repeat a sleep study in a year. We may be able to get you a new machine at the time.

## 2016-07-13 ENCOUNTER — Encounter: Payer: Self-pay | Admitting: *Deleted

## 2016-07-18 DIAGNOSIS — J301 Allergic rhinitis due to pollen: Secondary | ICD-10-CM | POA: Diagnosis not present

## 2016-07-18 DIAGNOSIS — J3089 Other allergic rhinitis: Secondary | ICD-10-CM | POA: Diagnosis not present

## 2016-07-26 DIAGNOSIS — J3089 Other allergic rhinitis: Secondary | ICD-10-CM | POA: Diagnosis not present

## 2016-07-26 DIAGNOSIS — J301 Allergic rhinitis due to pollen: Secondary | ICD-10-CM | POA: Diagnosis not present

## 2016-08-01 DIAGNOSIS — J3089 Other allergic rhinitis: Secondary | ICD-10-CM | POA: Diagnosis not present

## 2016-08-01 DIAGNOSIS — J301 Allergic rhinitis due to pollen: Secondary | ICD-10-CM | POA: Diagnosis not present

## 2016-08-02 DIAGNOSIS — G4733 Obstructive sleep apnea (adult) (pediatric): Secondary | ICD-10-CM | POA: Diagnosis not present

## 2016-08-08 DIAGNOSIS — J3089 Other allergic rhinitis: Secondary | ICD-10-CM | POA: Diagnosis not present

## 2016-08-08 DIAGNOSIS — J301 Allergic rhinitis due to pollen: Secondary | ICD-10-CM | POA: Diagnosis not present

## 2016-08-15 DIAGNOSIS — R7309 Other abnormal glucose: Secondary | ICD-10-CM | POA: Diagnosis not present

## 2016-08-15 DIAGNOSIS — G933 Postviral fatigue syndrome: Secondary | ICD-10-CM | POA: Diagnosis not present

## 2016-08-15 DIAGNOSIS — R5383 Other fatigue: Secondary | ICD-10-CM | POA: Diagnosis not present

## 2016-08-16 DIAGNOSIS — J3089 Other allergic rhinitis: Secondary | ICD-10-CM | POA: Diagnosis not present

## 2016-08-16 DIAGNOSIS — J301 Allergic rhinitis due to pollen: Secondary | ICD-10-CM | POA: Diagnosis not present

## 2016-08-16 DIAGNOSIS — J3081 Allergic rhinitis due to animal (cat) (dog) hair and dander: Secondary | ICD-10-CM | POA: Diagnosis not present

## 2016-08-25 ENCOUNTER — Telehealth: Payer: Self-pay | Admitting: Neurology

## 2016-08-25 ENCOUNTER — Encounter: Payer: Self-pay | Admitting: Neurology

## 2016-08-25 DIAGNOSIS — G4710 Hypersomnia, unspecified: Secondary | ICD-10-CM

## 2016-08-25 DIAGNOSIS — G4733 Obstructive sleep apnea (adult) (pediatric): Secondary | ICD-10-CM

## 2016-08-25 DIAGNOSIS — G4730 Sleep apnea, unspecified: Secondary | ICD-10-CM

## 2016-08-25 DIAGNOSIS — Z9989 Dependence on other enabling machines and devices: Secondary | ICD-10-CM

## 2016-08-25 MED ORDER — AMPHETAMINE-DEXTROAMPHETAMINE 10 MG PO TABS: 10.0000 mg | ORAL_TABLET | Freq: Two times a day (BID) | ORAL | 0 refills | Status: DC | PRN

## 2016-08-25 MED ORDER — AMPHETAMINE-DEXTROAMPHETAMINE 10 MG PO TABS: 10.0000 mg | ORAL_TABLET | Freq: Every day | ORAL | 0 refills | Status: DC

## 2016-08-25 NOTE — Telephone Encounter (Signed)
Rx for Adderall 10 mg renewed. She can take up to 1 pill twice daily as needed, prescription should be ready for pick up any time. Pt emailed.

## 2016-08-25 NOTE — Telephone Encounter (Signed)
Pt request refill for amphetamine-dextroamphetamine (ADDERALL) 10 MG tablet °

## 2016-08-25 NOTE — Telephone Encounter (Signed)
Dr. Frances FurbishAthar has communicated with patient via MyChart. Rx has been placed at the front desk

## 2016-09-01 DIAGNOSIS — J301 Allergic rhinitis due to pollen: Secondary | ICD-10-CM | POA: Diagnosis not present

## 2016-09-01 DIAGNOSIS — J3089 Other allergic rhinitis: Secondary | ICD-10-CM | POA: Diagnosis not present

## 2016-09-05 DIAGNOSIS — J301 Allergic rhinitis due to pollen: Secondary | ICD-10-CM | POA: Diagnosis not present

## 2016-09-05 DIAGNOSIS — J3089 Other allergic rhinitis: Secondary | ICD-10-CM | POA: Diagnosis not present

## 2016-09-05 DIAGNOSIS — G4733 Obstructive sleep apnea (adult) (pediatric): Secondary | ICD-10-CM | POA: Diagnosis not present

## 2016-09-08 DIAGNOSIS — H539 Unspecified visual disturbance: Secondary | ICD-10-CM | POA: Diagnosis not present

## 2016-09-08 DIAGNOSIS — H5210 Myopia, unspecified eye: Secondary | ICD-10-CM | POA: Diagnosis not present

## 2016-09-30 DIAGNOSIS — J3089 Other allergic rhinitis: Secondary | ICD-10-CM | POA: Diagnosis not present

## 2016-09-30 DIAGNOSIS — J301 Allergic rhinitis due to pollen: Secondary | ICD-10-CM | POA: Diagnosis not present

## 2016-10-07 DIAGNOSIS — J301 Allergic rhinitis due to pollen: Secondary | ICD-10-CM | POA: Diagnosis not present

## 2016-10-07 DIAGNOSIS — J3089 Other allergic rhinitis: Secondary | ICD-10-CM | POA: Diagnosis not present

## 2016-10-11 ENCOUNTER — Other Ambulatory Visit: Payer: Self-pay | Admitting: Neurology

## 2016-10-11 DIAGNOSIS — G4710 Hypersomnia, unspecified: Secondary | ICD-10-CM

## 2016-10-11 DIAGNOSIS — G4733 Obstructive sleep apnea (adult) (pediatric): Secondary | ICD-10-CM

## 2016-10-11 DIAGNOSIS — Z9989 Dependence on other enabling machines and devices: Secondary | ICD-10-CM

## 2016-10-11 DIAGNOSIS — G4730 Sleep apnea, unspecified: Secondary | ICD-10-CM

## 2016-10-12 ENCOUNTER — Other Ambulatory Visit: Payer: Self-pay | Admitting: Neurology

## 2016-10-12 DIAGNOSIS — G4710 Hypersomnia, unspecified: Secondary | ICD-10-CM

## 2016-10-12 DIAGNOSIS — Z9989 Dependence on other enabling machines and devices: Secondary | ICD-10-CM

## 2016-10-12 DIAGNOSIS — G4733 Obstructive sleep apnea (adult) (pediatric): Secondary | ICD-10-CM

## 2016-10-12 DIAGNOSIS — G4730 Sleep apnea, unspecified: Secondary | ICD-10-CM

## 2016-10-12 MED ORDER — AMPHETAMINE-DEXTROAMPHETAMINE 10 MG PO TABS: 10.0000 mg | ORAL_TABLET | Freq: Two times a day (BID) | ORAL | 0 refills | Status: DC | PRN

## 2016-10-12 NOTE — Progress Notes (Signed)
Reprinted due to not printed on Rx paper.

## 2016-10-13 DIAGNOSIS — J301 Allergic rhinitis due to pollen: Secondary | ICD-10-CM | POA: Diagnosis not present

## 2016-10-13 DIAGNOSIS — J3089 Other allergic rhinitis: Secondary | ICD-10-CM | POA: Diagnosis not present

## 2016-10-18 DIAGNOSIS — J3089 Other allergic rhinitis: Secondary | ICD-10-CM | POA: Diagnosis not present

## 2016-10-18 DIAGNOSIS — J3081 Allergic rhinitis due to animal (cat) (dog) hair and dander: Secondary | ICD-10-CM | POA: Diagnosis not present

## 2016-10-20 DIAGNOSIS — J301 Allergic rhinitis due to pollen: Secondary | ICD-10-CM | POA: Diagnosis not present

## 2016-10-20 DIAGNOSIS — J3089 Other allergic rhinitis: Secondary | ICD-10-CM | POA: Diagnosis not present

## 2016-11-02 ENCOUNTER — Other Ambulatory Visit: Payer: Self-pay | Admitting: Neurology

## 2016-11-02 DIAGNOSIS — Z9989 Dependence on other enabling machines and devices: Secondary | ICD-10-CM

## 2016-11-02 DIAGNOSIS — G4710 Hypersomnia, unspecified: Secondary | ICD-10-CM

## 2016-11-02 DIAGNOSIS — G4733 Obstructive sleep apnea (adult) (pediatric): Secondary | ICD-10-CM

## 2016-11-02 DIAGNOSIS — G4730 Sleep apnea, unspecified: Secondary | ICD-10-CM

## 2016-11-03 MED ORDER — AMPHETAMINE-DEXTROAMPHETAMINE 10 MG PO TABS: 10.0000 mg | ORAL_TABLET | Freq: Two times a day (BID) | ORAL | 0 refills | Status: DC | PRN

## 2016-11-04 DIAGNOSIS — J3089 Other allergic rhinitis: Secondary | ICD-10-CM | POA: Diagnosis not present

## 2016-11-04 DIAGNOSIS — J301 Allergic rhinitis due to pollen: Secondary | ICD-10-CM | POA: Diagnosis not present

## 2016-11-08 DIAGNOSIS — J301 Allergic rhinitis due to pollen: Secondary | ICD-10-CM | POA: Diagnosis not present

## 2016-11-17 DIAGNOSIS — J3089 Other allergic rhinitis: Secondary | ICD-10-CM | POA: Diagnosis not present

## 2016-11-17 DIAGNOSIS — J301 Allergic rhinitis due to pollen: Secondary | ICD-10-CM | POA: Diagnosis not present

## 2016-11-22 DIAGNOSIS — J3089 Other allergic rhinitis: Secondary | ICD-10-CM | POA: Diagnosis not present

## 2016-11-22 DIAGNOSIS — J301 Allergic rhinitis due to pollen: Secondary | ICD-10-CM | POA: Diagnosis not present

## 2016-12-09 DIAGNOSIS — J3089 Other allergic rhinitis: Secondary | ICD-10-CM | POA: Diagnosis not present

## 2016-12-09 DIAGNOSIS — J301 Allergic rhinitis due to pollen: Secondary | ICD-10-CM | POA: Diagnosis not present

## 2016-12-20 DIAGNOSIS — J3089 Other allergic rhinitis: Secondary | ICD-10-CM | POA: Diagnosis not present

## 2016-12-20 DIAGNOSIS — J301 Allergic rhinitis due to pollen: Secondary | ICD-10-CM | POA: Diagnosis not present

## 2017-01-04 ENCOUNTER — Encounter: Payer: Self-pay | Admitting: Neurology

## 2017-01-04 ENCOUNTER — Telehealth: Payer: Self-pay

## 2017-01-04 NOTE — Telephone Encounter (Signed)
I spoke to patient and reminded her to bring her CPAP or SD card to appt tomorrow.

## 2017-01-05 ENCOUNTER — Encounter: Payer: Self-pay | Admitting: Neurology

## 2017-01-05 ENCOUNTER — Ambulatory Visit (INDEPENDENT_AMBULATORY_CARE_PROVIDER_SITE_OTHER): Payer: BLUE CROSS/BLUE SHIELD | Admitting: Neurology

## 2017-01-05 VITALS — BP 110/70 | HR 70 | Resp 16 | Ht 66.0 in | Wt 194.0 lb

## 2017-01-05 DIAGNOSIS — G4710 Hypersomnia, unspecified: Secondary | ICD-10-CM

## 2017-01-05 DIAGNOSIS — G4730 Sleep apnea, unspecified: Secondary | ICD-10-CM | POA: Diagnosis not present

## 2017-01-05 DIAGNOSIS — G4733 Obstructive sleep apnea (adult) (pediatric): Secondary | ICD-10-CM | POA: Diagnosis not present

## 2017-01-05 DIAGNOSIS — Z9989 Dependence on other enabling machines and devices: Secondary | ICD-10-CM | POA: Diagnosis not present

## 2017-01-05 MED ORDER — AMPHETAMINE-DEXTROAMPHETAMINE 10 MG PO TABS: 10.0000 mg | ORAL_TABLET | Freq: Two times a day (BID) | ORAL | 0 refills | Status: DC | PRN

## 2017-01-05 NOTE — Progress Notes (Signed)
Subjective:    [name] ID: Alicia [name] is a 51 y.o. [name].  HPI     Interim history:   Alicia [name] is a very pleasant 51 year old right-handed woman with an underlying medical history of allergies, hyperlipidemia, and obstructive sleep apnea on CPAP with residual daytime somnolence, who presents for FU. Alicia [name] is unaccompanied today. I last saw Alicia [name] on 07/07/2016 after a gap of nearly 2 years, at which time Alicia [name] reported doing okay, weight had been fluctuating, but was going to Alicia gym on a fairly regular basis. Alicia [name] was fully compliant with CPAP, still had sleepiness during Alicia day especially in Alicia afternoons, particularly after eating a carb-laden meal. Other than that, Alicia [name] was stable, taking a vitamin D supplement and other supplements, vitamin D was low per Alicia [name] report and Alicia [name] was told to start taking over-Alicia-counter vit D per PCP. Alicia [name] had not tried Alicia Adderall that I suggested at Alicia last visit and had stopped taking Alicia Nuvigil even though it worked okay because of was too expensive. I suggested starting Alicia [name] on Adderall immediate release 10 mg once daily in Alicia early afternoon. Alicia [name] emailed in Alicia interim in November 2017, reporting being a little bit more focused during Alicia day and not overeating as much but as far as daytime somnolence Alicia [name] did not notice any telltale was palms. I suggested we increase Alicia Adderall to 10 mg twice daily, second dose no later than 3 PM to avoid nighttime insomnia.  Today, 01/05/2017 (all dictated new, as well as above notes, some dictation done in note pad or Word, outside of chart, may appear as copied):  I reviewed Alicia [name] CPAP compliance data from 12/06/2016 through 01/04/2017 which is a total of 30 days, during which time Alicia [name] used Alicia [name] CPAP every night with percent used days greater than 4 hours at 100%, indicating superb compliance with an average usage of 6 hours and 29 minutes, residual AHI at goal at 3.2 per hour, leak low with Alicia 95th percentile  at 3.1 L/m on a pressure of 10 cm with EPR of 2. Alicia [name] reports doing well, sometimes has difficulty sleeping at night, attributes this to taking Alicia Adderall, tries not to take it after lunch time and sometimes only takes a half pill for Alicia [name] second dose. Otherwise, Alicia [name] has been doing well, compliant with CPAP, is wondering if Alicia [name] is eligible for new machine but has no problems using Alicia current one. Alicia [name] uses nasal pillows with a chinstrap. No recent illness or medication changes are reported. Has no side effects from Alicia Adderall such as headaches, nervousness, palpitations, jitteriness, significant loss of appetite but Alicia [name] has lost a little bit of weight, does exercise on a regular basis.  Alicia [name]'s allergies, current medications, family history, past medical history, past social history, past surgical history and problem list were reviewed and updated as appropriate.   Previously (copied from previous notes for reference):   I saw Alicia [name] on 08/21/2014, at which time Alicia [name] reported doing well on Nuvigil 150 mg once daily and Alicia [name] felt that it was better than Alicia Provigil generic. Alicia [name] had some residual sleepiness but felt improved overall. Alicia [name] was compliant with CPAP therapy. I suggested we try low-dose Adderall as needed for residual sleepiness.   I reviewed Alicia [name] CPAP compliance data from 06/07/2016 through 07/06/2016 which is a total of 30 days, during which time Alicia [name] used Alicia [name] machine every night with percent used days greater than 4 hours at 100%, indicating superb compliance with an  average usage of 7 hours and 4 minutes, residual AHI 4.1 per hour, leak low for Alicia 95th percentile at 2.6 L/m on a pressure of 10 cm with EPR of 2.   I saw Alicia [name] on 02/13/2014, at which time Alicia [name] continued to endorse daytime somnolence. Alicia [name] had just been approved for Nuvigil but had not started it yet. Alicia [name] was fully compliant with CPAP therapy.   I reviewed Alicia [name] compliance data from Alicia compliance card on Alicia [name] machine: 07/22/2014  through 08/20/2014 which is a total of 30 days during which time Alicia [name] used Alicia [name] machine every night with percent used days greater than 4 hours of 97%, indicating excellent compliance, pressure still at 10 cm with EPR of 2. Residual AHI acceptable at 4.9 per hour and leak generally low with Alicia 95th percentile of leak at 6.2 L/m, average usage of 7 hours and 19 minutes. Alicia [name] is compliant with Alicia [name] CPAP machine, Alicia [name] has a [employer] nasal pillows. Alicia [name] has exacerbation of Alicia [name] allergies and also facial eczema. Alicia [name] has recently seen a dermatologist and was given a new ointment.   I saw Alicia [name] on 08/15/2013, at which time we talked about Alicia [name] sleep test results from 2013 and Alicia [name] CPAP compliance which was excellent. Alicia [name] has a family history of narcolepsy with cataplexy in Alicia [name] father. Alicia [name] felt Provigil as needed was helpful. Alicia [name] had trouble remembering Alicia second dose of Provigil and therefore I suggested a trial of Nuvigil once daily. Alicia [name] was approved for this.    I first met Alicia [name] on 05/23/2013, at which time Alicia [name] reported being veyr sleepy despite being compliant with CPAP therapy. I suggested that Alicia [name] try Provigil to help stay awake during Alicia day a little better, starting with 200 mg strength half a pill up to twice daily as needed. I advised Alicia [name] not to take it after 3 PM to avoid insomnia at night.  Alicia [name] has a FHx of narcolepsy with cataplexy in Alicia [name] father and was particularly concerned that Alicia [name] may have narcolepsy.    I had reviewed records including a recent CPAP compliance download from 04/02/2013 through 05/22/2013, total of 51 days, during which time Alicia [name] used CPAP every day. Percent used days greater than 4 hours was 96% indicating excellent compliance. Alicia [name] CPAP pressure is 10 cm water pressure with an EPR of 2. Residual AHI is 4.7 indicating an appropriate pressure level. Average usage was 6:41 hours.    I reviewed a sleep study report from 11/10/2011. This was a split-night study. REM percentage was 17% and Alicia  baseline portion of Alicia study. Alicia [name] AHI was 61.9 per hour with an oxyhemoglobin desaturation nadir of 75%. Alicia [name] was titrated on CPAP from 4-10 cm of water pressure with elimination of Alicia [name] sleep disordered breathing reported.    Alicia [name] uses a nasal pillows mask. I also reviewed older compliance data from August to Nov. 2013, during which time Alicia [name] was very compliant but had a high leak. At Alicia time, Alicia [name] was using a FFM and leak was much better with Alicia new mask.    Alicia [name] Past Medical History Is Significant For: Past Medical History:  Diagnosis Date  . Allergy   . Hypersomnia with sleep apnea, unspecified 05/23/2013  . OSA on CPAP 08/15/2013    Alicia [name] Past Surgical History Is Significant For: Past Surgical History:  Procedure Laterality Date  . HYSTEROSCOPY W/D&C    . WISDOM TOOTH EXTRACTION      Alicia [name] Family History Is Significant For: Family History  Problem  Relation Age of Onset  . Diabetes Father     type 2  . Depression Father   . Dementia Father   . Narcolepsy Father     cataplexy  . High Cholesterol Father   . Stroke Maternal Grandmother   . Heart disease Maternal Grandfather     Alicia [name] Social History Is Significant For: Social History   Social History  . Marital status: Single    Spouse name: N/A  . Number of children: 0  . Years of education: Post Grad   Occupational History  .      [employer]   Social History Main Topics  . Smoking status: Never Smoker  . Smokeless tobacco: Never Used  . Alcohol use No  . Drug use: No  . Sexual activity: No   Other Topics Concern  . None   Social History Narrative   [name] lives at home alone.   Caffeine Use: occasionally soda    Alicia [name] Allergies Are:  Allergies  Allergen Reactions  . Latex   . Other     Seasonal   . Lubricants   :   Alicia [name] Current Medications Are:  Outpatient Encounter Prescriptions as of 01/05/2017  Medication Sig  . amphetamine-dextroamphetamine (ADDERALL) 10 MG tablet Take 1 tablet (10 mg total)  by mouth 2 (two) times daily as needed.  Marland Kitchen b complex vitamins capsule Take 1 capsule by mouth daily.  . Bepotastine Besilate (BEPREVE) 1.5 % SOLN Place 1 drop into both eyes once.  . betamethasone valerate lotion (VALISONE) 0.1 % Apply 1 application topically daily.  . calcium carbonate 200 MG capsule Take 250 mg by mouth 2 (two) times daily with a meal.  . cholecalciferol (VITAMIN D) 1000 UNITS tablet Take 1,000 Units by mouth daily.  Marland Kitchen desonide (DESOWEN) 0.05 % lotion Apply 1 application topically 2 (two) times daily.  . fexofenadine (ALLEGRA) 180 MG tablet Take 180 mg by mouth daily.  . pimecrolimus (ELIDEL) 1 % cream Apply 1 application topically 2 (two) times daily.  Marland Kitchen pyridoxine (B-6) 100 MG tablet Take 100 mg by mouth daily.  . Triamcinolone Acetonide (NASACORT ALLERGY 24HR) 55 MCG/ACT AERO Place 2 sprays into Alicia nose 2 (two) times daily.  . vitamin B-12 (CYANOCOBALAMIN) 100 MCG tablet Take 50 mcg by mouth daily.  . vitamin E 100 UNIT capsule Take 100 Units by mouth daily.  . [DISCONTINUED] doxycycline (VIBRA-TABS) 100 MG tablet Take 1 tablet (100 mg total) by mouth 2 (two) times daily.   No facility-administered encounter medications on file as of 01/05/2017.   :  Review of Systems:  Out of a complete 14 point review of systems, all are reviewed and negative with Alicia exception of these symptoms as listed below: Review of Systems  Neurological:       [name] states that Alicia [name] is doing well on CPAP. Asking if Alicia [name] qualifies for a new machine?  Alicia [name] would like to discuss Alicia [name] Adderall and Alicia side effects.     Objective:  Neurologic Exam  Physical Exam Physical Examination:   Vitals:   01/05/17 0954  BP: 110/70  Pulse: 70  Resp: 16    General Examination: Alicia [name] is a very pleasant 51 y.o. [name] in no acute distress. Alicia [name] appears well-developed and well-nourished and well groomed.   HEENT: Normocephalic, atraumatic, pupils are equal, round and reactive to light and  accommodation. Extraocular tracking is good without limitation to gaze excursion or nystagmus noted. Normal smooth pursuit is noted. Hearing is grossly  intact. Face is symmetric with normal facial animation and normal facial sensation. Speech is clear with no dysarthria noted. There is no hypophonia. Mildly nasal speech. There is no lip, neck/head, jaw or voice tremor. Neck is supple with full range of passive and active motion. Oropharynx exam reveals: mild mouth dryness, good dental hygiene and moderate airway crowding. Mallampati is class I. Tongue protrudes centrally and palate elevates symmetrically. Tonsils are 1+.  Chest: Clear to auscultation without wheezing, rhonchi or crackles noted.  Heart: S1+S2+0, regular and normal without murmurs, rubs or gallops noted.   Abdomen: Soft, non-tender and non-distended with normal bowel sounds appreciated on auscultation.  Extremities: There is no pitting edema in Alicia distal lower extremities bilaterally. Pedal pulses are intact.  Skin: Warm and dry without trophic changes noted.  Musculoskeletal: exam reveals no obvious joint deformities, tenderness or joint swelling or erythema.   Neurologically:  Mental status: Alicia [name] is awake, alert and oriented in all 4 spheres. Alicia [name] immediate and remote memory, attention, language skills and fund of knowledge are appropriate. There is no evidence of aphasia, agnosia, apraxia or anomia. Speech is clear with normal prosody and enunciation. Thought process is linear. Mood is normal and affect is normal.  Cranial nerves II - XII are as described above under HEENT exam. In addition: shoulder shrug is normal with equal shoulder height noted. Motor exam: Normal bulk, strength and tone is noted. There is no drift, tremor or rebound. Romberg is negative. Reflexes are 1-2+ throughout. Fine motor skills and coordination: intact.  Cerebellar testing: No dysmetria or intention tremor. There is no truncal or gait ataxia.   Sensory exam: intact to light touch in Alicia upper and lower extremities.  Gait, station and balance: Alicia [name] stands easily. No veering to one side is noted. No leaning to one side is noted. Posture is age-appropriate and stance is narrow based. Gait shows normal stride length and normal pace. No problems turning are noted. Tandem walk is unremarkable.   Assessment and Plan:  In summary, Irisha Grandmaison is a very pleasant 87 y.o.-year old [name] with an underlying medical history of allergies and obesity, who returns for follow-up consultation of Alicia [name] OSA, well established on CPAP therapy with history of residual daytime somnolence, for which Alicia [name] was started on Adderall immediate release 10 mg once daily last time some 6 months ago and we increased it in Alicia interim about 4 months ago to twice daily. Alicia [name] has some nighttime insomnia when Alicia [name] takes Alicia second dose of Adderall too late or Alicia full dose, Alicia [name] has been cutting back on it or taking it earlier in Alicia late morning of very early afternoon. Alicia [name] feels that it has been helpful and has no significant side effects. Physical exam is stable, Alicia [name] has lost a little bit of weight. I provided a new prescription for Alicia [name] Adderall 10 mg twice daily. Alicia [name] is fully compliant with CPAP therapy and commended for this. Alicia [name] may be eligible for new CPAP machine as Alicia current machine maybe over 54 years old. I provided a new prescription for that. I would like to see Alicia [name] back in 6 months, sooner as needed. I answered all Alicia [name] questions today and Alicia [name] was in agreement. I spent 25 minutes in total face-to-face time with Alicia [name], more than 50% of which was spent in counseling and coordination of care, reviewing test results, reviewing medication and discussing or reviewing Alicia diagnosis of OSA and hypersomnia, Alicia prognosis and treatment options. Pertinent laboratory and imaging  test results that were available during this visit with Alicia [name] were reviewed by me and  considered in my medical decision making (see chart for details).

## 2017-01-05 NOTE — Patient Instructions (Signed)
We will keep the adderall at 10 mg 2 times a day, AM and lunchtime.  Please continue using your CPAP regularly. While your insurance requires that you use CPAP at least 4 hours each night on 70% of the nights, I recommend, that you not skip any nights and use it throughout the night if you can. Getting used to CPAP and staying with the treatment long term does take time and patience and discipline. Untreated obstructive sleep apnea when it is moderate to severe can have an adverse impact on cardiovascular health and raise her risk for heart disease, arrhythmias, hypertension, congestive heart failure, stroke and diabetes. Untreated obstructive sleep apnea causes sleep disruption, nonrestorative sleep, and sleep deprivation. This can have an impact on your day to day functioning and cause daytime sleepiness and impairment of cognitive function, memory loss, mood disturbance, and problems focussing. Using CPAP regularly can improve these symptoms.  Keep up the good work! I will see you back in 6 months.

## 2017-01-17 DIAGNOSIS — J301 Allergic rhinitis due to pollen: Secondary | ICD-10-CM | POA: Diagnosis not present

## 2017-01-17 DIAGNOSIS — J3089 Other allergic rhinitis: Secondary | ICD-10-CM | POA: Diagnosis not present

## 2017-01-24 DIAGNOSIS — G4733 Obstructive sleep apnea (adult) (pediatric): Secondary | ICD-10-CM | POA: Diagnosis not present

## 2017-01-30 DIAGNOSIS — J301 Allergic rhinitis due to pollen: Secondary | ICD-10-CM | POA: Diagnosis not present

## 2017-01-30 DIAGNOSIS — J3089 Other allergic rhinitis: Secondary | ICD-10-CM | POA: Diagnosis not present

## 2017-02-02 DIAGNOSIS — G4733 Obstructive sleep apnea (adult) (pediatric): Secondary | ICD-10-CM | POA: Diagnosis not present

## 2017-02-02 DIAGNOSIS — N6011 Diffuse cystic mastopathy of right breast: Secondary | ICD-10-CM | POA: Diagnosis not present

## 2017-02-06 DIAGNOSIS — J301 Allergic rhinitis due to pollen: Secondary | ICD-10-CM | POA: Diagnosis not present

## 2017-02-06 DIAGNOSIS — J3089 Other allergic rhinitis: Secondary | ICD-10-CM | POA: Diagnosis not present

## 2017-02-10 DIAGNOSIS — J3089 Other allergic rhinitis: Secondary | ICD-10-CM | POA: Diagnosis not present

## 2017-02-10 DIAGNOSIS — J301 Allergic rhinitis due to pollen: Secondary | ICD-10-CM | POA: Diagnosis not present

## 2017-02-13 DIAGNOSIS — J3089 Other allergic rhinitis: Secondary | ICD-10-CM | POA: Diagnosis not present

## 2017-02-13 DIAGNOSIS — J301 Allergic rhinitis due to pollen: Secondary | ICD-10-CM | POA: Diagnosis not present

## 2017-02-16 DIAGNOSIS — J301 Allergic rhinitis due to pollen: Secondary | ICD-10-CM | POA: Diagnosis not present

## 2017-02-16 DIAGNOSIS — J3089 Other allergic rhinitis: Secondary | ICD-10-CM | POA: Diagnosis not present

## 2017-02-23 DIAGNOSIS — J3089 Other allergic rhinitis: Secondary | ICD-10-CM | POA: Diagnosis not present

## 2017-02-23 DIAGNOSIS — J301 Allergic rhinitis due to pollen: Secondary | ICD-10-CM | POA: Diagnosis not present

## 2017-02-28 DIAGNOSIS — Z124 Encounter for screening for malignant neoplasm of cervix: Secondary | ICD-10-CM | POA: Diagnosis not present

## 2017-02-28 DIAGNOSIS — Z01411 Encounter for gynecological examination (general) (routine) with abnormal findings: Secondary | ICD-10-CM | POA: Diagnosis not present

## 2017-02-28 DIAGNOSIS — Z6832 Body mass index (BMI) 32.0-32.9, adult: Secondary | ICD-10-CM | POA: Diagnosis not present

## 2017-03-04 DIAGNOSIS — G4733 Obstructive sleep apnea (adult) (pediatric): Secondary | ICD-10-CM | POA: Diagnosis not present

## 2017-03-16 DIAGNOSIS — H5210 Myopia, unspecified eye: Secondary | ICD-10-CM | POA: Diagnosis not present

## 2017-03-16 DIAGNOSIS — H539 Unspecified visual disturbance: Secondary | ICD-10-CM | POA: Diagnosis not present

## 2017-03-17 DIAGNOSIS — J301 Allergic rhinitis due to pollen: Secondary | ICD-10-CM | POA: Diagnosis not present

## 2017-03-17 DIAGNOSIS — J3089 Other allergic rhinitis: Secondary | ICD-10-CM | POA: Diagnosis not present

## 2017-03-24 DIAGNOSIS — J3089 Other allergic rhinitis: Secondary | ICD-10-CM | POA: Diagnosis not present

## 2017-03-24 DIAGNOSIS — J301 Allergic rhinitis due to pollen: Secondary | ICD-10-CM | POA: Diagnosis not present

## 2017-03-30 ENCOUNTER — Other Ambulatory Visit: Payer: Self-pay | Admitting: Neurology

## 2017-03-30 DIAGNOSIS — G4730 Sleep apnea, unspecified: Secondary | ICD-10-CM

## 2017-03-30 DIAGNOSIS — G4733 Obstructive sleep apnea (adult) (pediatric): Secondary | ICD-10-CM

## 2017-03-30 DIAGNOSIS — Z9989 Dependence on other enabling machines and devices: Secondary | ICD-10-CM

## 2017-03-30 DIAGNOSIS — G4710 Hypersomnia, unspecified: Secondary | ICD-10-CM

## 2017-03-30 MED ORDER — AMPHETAMINE-DEXTROAMPHETAMINE 10 MG PO TABS: 10.0000 mg | ORAL_TABLET | Freq: Two times a day (BID) | ORAL | 0 refills | Status: DC | PRN

## 2017-03-30 NOTE — Telephone Encounter (Signed)
Pt is up to date on her appts and is due for a refill on adderall. Will print RX and giv to Dr. Frances FurbishAthar for review.

## 2017-03-31 DIAGNOSIS — J3089 Other allergic rhinitis: Secondary | ICD-10-CM | POA: Diagnosis not present

## 2017-03-31 DIAGNOSIS — J301 Allergic rhinitis due to pollen: Secondary | ICD-10-CM | POA: Diagnosis not present

## 2017-04-04 DIAGNOSIS — G4733 Obstructive sleep apnea (adult) (pediatric): Secondary | ICD-10-CM | POA: Diagnosis not present

## 2017-04-10 DIAGNOSIS — J3089 Other allergic rhinitis: Secondary | ICD-10-CM | POA: Diagnosis not present

## 2017-04-10 DIAGNOSIS — J301 Allergic rhinitis due to pollen: Secondary | ICD-10-CM | POA: Diagnosis not present

## 2017-04-18 DIAGNOSIS — J3089 Other allergic rhinitis: Secondary | ICD-10-CM | POA: Diagnosis not present

## 2017-04-18 DIAGNOSIS — J301 Allergic rhinitis due to pollen: Secondary | ICD-10-CM | POA: Diagnosis not present

## 2017-05-01 DIAGNOSIS — J301 Allergic rhinitis due to pollen: Secondary | ICD-10-CM | POA: Diagnosis not present

## 2017-05-01 DIAGNOSIS — J3089 Other allergic rhinitis: Secondary | ICD-10-CM | POA: Diagnosis not present

## 2017-05-02 DIAGNOSIS — M5412 Radiculopathy, cervical region: Secondary | ICD-10-CM | POA: Diagnosis not present

## 2017-05-02 DIAGNOSIS — M5032 Other cervical disc degeneration, mid-cervical region, unspecified level: Secondary | ICD-10-CM | POA: Diagnosis not present

## 2017-05-02 DIAGNOSIS — M4802 Spinal stenosis, cervical region: Secondary | ICD-10-CM | POA: Diagnosis not present

## 2017-05-02 DIAGNOSIS — G894 Chronic pain syndrome: Secondary | ICD-10-CM | POA: Diagnosis not present

## 2017-05-04 DIAGNOSIS — G4733 Obstructive sleep apnea (adult) (pediatric): Secondary | ICD-10-CM | POA: Diagnosis not present

## 2017-05-11 DIAGNOSIS — M5032 Other cervical disc degeneration, mid-cervical region, unspecified level: Secondary | ICD-10-CM | POA: Diagnosis not present

## 2017-05-11 DIAGNOSIS — M5412 Radiculopathy, cervical region: Secondary | ICD-10-CM | POA: Diagnosis not present

## 2017-05-11 DIAGNOSIS — M50322 Other cervical disc degeneration at C5-C6 level: Secondary | ICD-10-CM | POA: Diagnosis not present

## 2017-05-11 DIAGNOSIS — M4802 Spinal stenosis, cervical region: Secondary | ICD-10-CM | POA: Diagnosis not present

## 2017-05-12 DIAGNOSIS — J3089 Other allergic rhinitis: Secondary | ICD-10-CM | POA: Diagnosis not present

## 2017-05-12 DIAGNOSIS — J301 Allergic rhinitis due to pollen: Secondary | ICD-10-CM | POA: Diagnosis not present

## 2017-05-22 DIAGNOSIS — R21 Rash and other nonspecific skin eruption: Secondary | ICD-10-CM | POA: Diagnosis not present

## 2017-05-22 DIAGNOSIS — J3081 Allergic rhinitis due to animal (cat) (dog) hair and dander: Secondary | ICD-10-CM | POA: Diagnosis not present

## 2017-05-22 DIAGNOSIS — J301 Allergic rhinitis due to pollen: Secondary | ICD-10-CM | POA: Diagnosis not present

## 2017-05-22 DIAGNOSIS — J3089 Other allergic rhinitis: Secondary | ICD-10-CM | POA: Diagnosis not present

## 2017-05-29 DIAGNOSIS — M50322 Other cervical disc degeneration at C5-C6 level: Secondary | ICD-10-CM | POA: Diagnosis not present

## 2017-05-29 DIAGNOSIS — M542 Cervicalgia: Secondary | ICD-10-CM | POA: Diagnosis not present

## 2017-05-29 DIAGNOSIS — M4802 Spinal stenosis, cervical region: Secondary | ICD-10-CM | POA: Diagnosis not present

## 2017-05-29 DIAGNOSIS — M5412 Radiculopathy, cervical region: Secondary | ICD-10-CM | POA: Diagnosis not present

## 2017-05-30 DIAGNOSIS — J301 Allergic rhinitis due to pollen: Secondary | ICD-10-CM | POA: Diagnosis not present

## 2017-05-30 DIAGNOSIS — J3089 Other allergic rhinitis: Secondary | ICD-10-CM | POA: Diagnosis not present

## 2017-06-04 DIAGNOSIS — G4733 Obstructive sleep apnea (adult) (pediatric): Secondary | ICD-10-CM | POA: Diagnosis not present

## 2017-06-21 ENCOUNTER — Other Ambulatory Visit: Payer: Self-pay | Admitting: Neurology

## 2017-06-21 DIAGNOSIS — Z9989 Dependence on other enabling machines and devices: Secondary | ICD-10-CM

## 2017-06-21 DIAGNOSIS — G4710 Hypersomnia, unspecified: Secondary | ICD-10-CM

## 2017-06-21 DIAGNOSIS — G4730 Sleep apnea, unspecified: Secondary | ICD-10-CM

## 2017-06-21 DIAGNOSIS — G4733 Obstructive sleep apnea (adult) (pediatric): Secondary | ICD-10-CM

## 2017-06-21 MED ORDER — AMPHETAMINE-DEXTROAMPHETAMINE 10 MG PO TABS: 10.0000 mg | ORAL_TABLET | Freq: Two times a day (BID) | ORAL | 0 refills | Status: DC | PRN

## 2017-06-22 DIAGNOSIS — G894 Chronic pain syndrome: Secondary | ICD-10-CM | POA: Diagnosis not present

## 2017-06-22 DIAGNOSIS — M5412 Radiculopathy, cervical region: Secondary | ICD-10-CM | POA: Diagnosis not present

## 2017-06-22 DIAGNOSIS — M4802 Spinal stenosis, cervical region: Secondary | ICD-10-CM | POA: Diagnosis not present

## 2017-06-22 DIAGNOSIS — M50322 Other cervical disc degeneration at C5-C6 level: Secondary | ICD-10-CM | POA: Diagnosis not present

## 2017-06-29 NOTE — Progress Notes (Signed)
[name] [address] [place], [place] [address] Phone: [phone] Subjective:    I'm seeing this patient by the request  of:    CC: left neck and arm pain    [id number]  Laurelle Groseclose is a 51 y.o. female coming in with complaint of Left arm and neck pain. Patient has known cervical disc degenerative changes. Patient hasn't responded to an epidural long time ago. Patient unfortunately started having worsening symptoms. Son of the provider and was given an epidural recently with no significant change. Patient states that is radiating down the arm. Rates the severity pain is 8 out of 10. Waking her up at night. Not responding to ibuprofen over-the-counter. Patient has tried icing with very minimal spots as well. Has done formal physical therapy with very minimal response.    MRI of cervical spine able 21st 2015. Patient was found to have a broad based disc osteophyte complex at C5-C6. An foraminal narrowing there. Otherwise very mild arthritic changes.  Past Medical History:  Diagnosis Date  . Allergy   . Hypersomnia with sleep apnea, unspecified 05/23/2013  . OSA on CPAP 08/15/2013   Past Surgical History:  Procedure Laterality Date  . HYSTEROSCOPY W/D&C    . WISDOM TOOTH EXTRACTION     Social History   Social History  . Marital status: Single    Spouse name: N/A  . Number of children: 0  . Years of education: Post Grad   Occupational History  .      [employer]   Social History Main Topics  . Smoking status: Never Smoker  . Smokeless tobacco: Never Used  . Alcohol use No  . Drug use: No  . Sexual activity: No   Other Topics Concern  . None   Social History Narrative   Patient lives at home alone.   Caffeine Use: occasionally soda   Allergies  Allergen Reactions  . Latex   . Other     Seasonal   . Lubricants    Family History  Problem Relation Age of Onset  . Diabetes Father        type 2  . Depression  Father   . Dementia Father   . Narcolepsy Father        cataplexy  . High Cholesterol Father   . Stroke Maternal Grandmother   . Heart disease Maternal Grandfather      Past medical history, social, surgical and family history all reviewed in electronic medical record.  No pertanent information unless stated regarding to the chief complaint.   Review of Systems:Review of systems updated and as accurate as of 06/30/17  No headache, visual changes, nausea, vomiting, diarrhea, constipation, dizziness, abdominal pain, skin rash, fevers, chills, night sweats, weight loss, swollen lymph nodes, body aches, joint swelling, , chest pain, shortness of breath, mood changes. Positive muscle aches  Objective  Blood pressure 114/80, pulse 68, height  (1.676 m), weight 194 lb (88 kg), SpO2 99 %. Systems examined below as of 06/30/17   General: No apparent distress alert and oriented x3 mood and affect normal, dressed appropriately.  HEENT: Pupils equal, extraocular movements intact  Respiratory: Patient's speak in full sentences and does not appear short of breath  Cardiovascular: No lower extremity edema, non tender, no erythema  Skin: Warm dry intact with no signs of infection or rash on extremities or on axial skeleton.  Abdomen: Soft nontender  Neuro: Cranial nerves II through XII are intact, neurovascularly  intact in all extremities with 2+ DTRs and 2+ pulses.  Lymph: No lymphadenopathy of posterior or anterior cervical chain or axillae bilaterally.  Gait normal with good balance and coordination.  MSK:  Non tender with full range of motion and good stability and symmetric strength and tone of shoulders, elbows, , hip, knee and ankles bilaterally.  Neck: Inspection unremarkable. No palpable stepoffs. Negative Spurling's maneuver. Mild limitation in side bending and extension Grip strength and sensation normal in bilateral hands Strength good C4 to T1 distribution No sensory change to  C4 to T1 Negative Hoffman sign bilaterally Reflexes normal  Wrist:Left Inspection normal with no visible erythema or swelling. ROM smooth and normal with good flexion and extension and ulnar/radial deviation that is symmetrical with opposite wrist. Palpation is normal over metacarpals, navicular, lunate, and TFCC; tendons without tenderness/ swelling No snuffbox tenderness. No tenderness over Canal of Guyon. Strength 5/5 in all directions without pain. Negative Finkelstein, positive tinel's and phalens. Negative Watson's test.   Procedure: Real-time Ultrasound Guided Injection of right carpal tunnel Device: GE Logiq Q7 Ultrasound guided injection is preferred based studies that show increased duration, increased effect, greater accuracy, decreased procedural pain, increased response rate with ultrasound guided versus blind injection.  Verbal informed consent obtained.  Time-out conducted.  Noted no overlying erythema, induration, or other signs of local infection.  Skin prepped in a sterile fashion.  Local anesthesia: Topical Ethyl chloride.  With sterile technique and under real time ultrasound guidance:  median nerve visualized.  23g 5/8 inch needle inserted distal to proximal approach into nerve sheath. Pictures taken nfor needle placement. Patient did have injection of 2 cc of 0.5% Marcaine, and 1 cc of Kenalog 40 mg/dL. Completed without difficulty  Pain immediately resolved suggesting accurate placement of the medication.  Advised to call if fevers/chills, erythema, induration, drainage, or persistent bleeding.  Images permanently stored and available for review in the ultrasound unit.  Impression: Technically successful ultrasound guided injection.  Osteopathic findings C6 flexed rotated and side bent left T3 extended rotated and side bent right inhaled third rib T6 extended rotated and side bent right     Impression and Recommendations:     This case required medical  decision making of moderate complexity.      Note: This dictation was prepared with Dragon dictation along with smaller phrase technology. Any transcriptional errors that result from this process are unintentional.

## 2017-06-30 ENCOUNTER — Ambulatory Visit (INDEPENDENT_AMBULATORY_CARE_PROVIDER_SITE_OTHER): Payer: BLUE CROSS/BLUE SHIELD | Admitting: Family Medicine

## 2017-06-30 ENCOUNTER — Ambulatory Visit: Payer: Self-pay

## 2017-06-30 ENCOUNTER — Encounter: Payer: Self-pay | Admitting: Family Medicine

## 2017-06-30 VITALS — BP 114/80 | HR 68 | Ht 66.0 in | Wt 194.0 lb

## 2017-06-30 DIAGNOSIS — G5602 Carpal tunnel syndrome, left upper limb: Secondary | ICD-10-CM | POA: Insufficient documentation

## 2017-06-30 DIAGNOSIS — M25532 Pain in left wrist: Secondary | ICD-10-CM

## 2017-06-30 DIAGNOSIS — M999 Biomechanical lesion, unspecified: Secondary | ICD-10-CM | POA: Diagnosis not present

## 2017-06-30 DIAGNOSIS — M5030 Other cervical disc degeneration, unspecified cervical region: Secondary | ICD-10-CM | POA: Insufficient documentation

## 2017-06-30 MED ORDER — GABAPENTIN 100 MG PO CAPS: 200.0000 mg | ORAL_CAPSULE | Freq: Every day | ORAL | 3 refills | Status: DC

## 2017-06-30 MED ORDER — VITAMIN D (ERGOCALCIFEROL) 1.25 MG (50000 UNIT) PO CAPS: 50000.0000 [IU] | ORAL_CAPSULE | ORAL | 0 refills | Status: DC

## 2017-06-30 NOTE — Assessment & Plan Note (Signed)
As stated above. Patient was given an injection. Patient's physical exam did seem to be more of a carpal tunnel. Patient did other home exercises and icing regimen we discussed. Bracing at night. Topical anti-inflammatories given once weekly vitamin D. Follow-up again in 3-4 weeks

## 2017-06-30 NOTE — Assessment & Plan Note (Signed)
Patient has known arthritis. Patient though had a negative Spurling's today. Did not respond to last outpatient epidural. I do believe that some of the arm pain is likely secondary to more of the carpal tunnel and hopefully will respond well to the injection. We discussed icing regimen, and gabapentin given, responded well to osteopathic manipulation. Follow-up again in 3-4 weeks

## 2017-06-30 NOTE — Patient Instructions (Signed)
good to see you  Ice 20 minutes 2 times daily. Usually after activity and before bed. Exercises 3 times a week.  Standing  Desk could be great  Tried injection on the median nerve to see if that helps pennsaid pinkie amount topically 2 times daily as needed.   Gabapentin  at night Once weekly vitamin D for 12 weeks.  Over the counter get Tuemric  twice daily  Tart cherry extract any dose at night See me again in 3-4 weeks.

## 2017-06-30 NOTE — Assessment & Plan Note (Addendum)
Decision today to treat with OMT was based on Physical Exam  After verbal consent patient was treated with HVLA, ME, FPR techniques in cervical, thoracic, rib areas  Patient tolerated the procedure well with improvement in symptoms  Patient given exercises, stretches and lifestyle modifications  See medications in patient instructions if given  Patient will follow up in 3-4 weeks 

## 2017-07-05 DIAGNOSIS — G4733 Obstructive sleep apnea (adult) (pediatric): Secondary | ICD-10-CM | POA: Diagnosis not present

## 2017-07-13 ENCOUNTER — Encounter: Payer: Self-pay | Admitting: Neurology

## 2017-07-13 ENCOUNTER — Ambulatory Visit (INDEPENDENT_AMBULATORY_CARE_PROVIDER_SITE_OTHER): Payer: BLUE CROSS/BLUE SHIELD | Admitting: Neurology

## 2017-07-13 VITALS — BP 110/60 | HR 68 | Ht 66.0 in | Wt 194.0 lb

## 2017-07-13 DIAGNOSIS — Z9989 Dependence on other enabling machines and devices: Secondary | ICD-10-CM | POA: Diagnosis not present

## 2017-07-13 DIAGNOSIS — G4733 Obstructive sleep apnea (adult) (pediatric): Secondary | ICD-10-CM | POA: Diagnosis not present

## 2017-07-13 DIAGNOSIS — G4730 Sleep apnea, unspecified: Secondary | ICD-10-CM | POA: Diagnosis not present

## 2017-07-13 DIAGNOSIS — G4710 Hypersomnia, unspecified: Secondary | ICD-10-CM

## 2017-07-13 NOTE — Progress Notes (Signed)
Subjective:    Patient ID: Alicia [name] is a 51 y.o. female.  HPI     Interim history:   Ms. Beeck is a very pleasant 51 year old right-handed woman with an underlying medical history of allergies, hyperlipidemia, and obstructive sleep apnea on CPAP with residual daytime somnolence, who presents for follow-up consultation of her sleep apnea and hypersomnolence. The patient is unaccompanied today. I last saw her on 01/05/2017, at which time she was compliant with her CPAP. She had occasional difficulty maintaining sleep at night, was attributing this to taking the Adderall. She was trying to be careful not to take it too late after lunch. I suggested we continue Adderall immediate release 10 mg twice a day.   Today, 07/13/2017: I reviewed her CPAP compliance data from 06/11/2017 through 07/10/2017 which is a total of 30 days, during which time she used her CPAP every night with percent used days greater than 4 hours at 100%, indicating superb compliance with an average usage of 7 hours and 11 minutes, residual AHI borderline at 6.4 per hour, leak acceptable with the 95th percentile at 12.6 L/m on a pressure of 10 cm with EPR of 2. She reports doing well with CPAP. She has had flare up of her neck pain, has a Hx of spondylosis at C5/6, has seen Dr. Gardenia Phlegm on 06/30/17 and also has had dry needling through a PT. Has an appointment with ortho next month.   The patient's allergies, current medications, family history, past medical history, past social history, past surgical history and problem list were reviewed and updated as appropriate.    Previously (copied from previous notes for reference):   I reviewed her CPAP compliance data from 12/06/2016 through 01/04/2017 which is a total of 30 days, during which time she used her CPAP every night with percent used days greater than 4 hours at 100%, indicating superb compliance with an average usage of 6 hours and 29 minutes, residual AHI at  goal at 3.2 per hour, leak low with the 95th percentile at 3.1 L/m on a pressure of 10 cm with EPR of 2.    I saw her on 07/07/2016 after a gap of nearly 2 years, at which time she reported doing okay, weight had been fluctuating, but was going to the gym on a fairly regular basis. She was fully compliant with CPAP, still had sleepiness during the day especially in the afternoons, particularly after eating a carb-laden meal. Other than that, she was stable, taking a vitamin D supplement and other supplements, vitamin D was low per her report and she was told to start taking over-the-counter vit D per PCP. She had not tried the Adderall that I suggested at the last visit and had stopped taking the Nuvigil even though it worked okay because of was too expensive. I suggested starting her on Adderall immediate release 10 mg once daily in the early afternoon. She emailed in the interim in November 2017, reporting being a little bit more focused during the day and not overeating as much but as far as daytime somnolence she did not notice any telltale was palms. I suggested we increase the Adderall to 10 mg twice daily, second dose no later than 3 PM to avoid nighttime insomnia.     I saw her on 08/21/2014, at which time she reported doing well on Nuvigil 150 mg once daily and she felt that it was better than the Provigil generic. She had some residual sleepiness but felt improved overall.  She was compliant with CPAP therapy. I suggested we try low-dose Adderall as needed for residual sleepiness.   I reviewed her CPAP compliance data from 06/07/2016 through 07/06/2016 which is a total of 30 days, during which time she used her machine every night with percent used days greater than 4 hours at 100%, indicating superb compliance with an average usage of 7 hours and 4 minutes, residual AHI 4.1 per hour, leak low for the 95th percentile at 2.6 L/m on a pressure of 10 cm with EPR of 2.   I saw her on 02/13/2014, at  which time she continued to endorse daytime somnolence. She had just been approved for Nuvigil but had not started it yet. She was fully compliant with CPAP therapy.   I reviewed her compliance data from the compliance card on her machine: 07/22/2014 through 08/20/2014 which is a total of 30 days during which time she used her machine every night with percent used days greater than 4 hours of 97%, indicating excellent compliance, pressure still at 10 cm with EPR of 2. Residual AHI acceptable at 4.9 per hour and leak generally low with the 95th percentile of leak at 6.2 L/m, average usage of 7 hours and 19 minutes. She is compliant with her CPAP machine, she has a [employer]P Pillaro nasal pillows. She has exacerbation of her allergies and also facial eczema. She has recently seen a dermatologist and was given a new ointment.   I saw her on 08/15/2013, at which time we talked about her sleep test results from 2013 and her CPAP compliance which was excellent. She has a family history of narcolepsy with cataplexy in her father. She felt Provigil as needed was helpful. She had trouble remembering the second dose of Provigil and therefore I suggested a trial of Nuvigil once daily. She was approved for this.    I first met her on 05/23/2013, at which time she reported being veyr sleepy despite being compliant with CPAP therapy. I suggested that she try Provigil to help stay awake during the day a little better, starting with 200 mg strength half a pill up to twice daily as needed. I advised her not to take it after 3 PM to avoid insomnia at night.  She has a FHx of narcolepsy with cataplexy in her father and was particularly concerned that she may have narcolepsy.    I had reviewed records including a recent CPAP compliance download from 04/02/2013 through 05/22/2013, total of 51 days, during which time she used CPAP every day. Percent used days greater than 4 hours was 96% indicating excellent compliance. Her CPAP  pressure is 10 cm water pressure with an EPR of 2. Residual AHI is 4.7 indicating an appropriate pressure level. Average usage was 6:41 hours.    I reviewed a sleep study report from 11/10/2011. This was a split-night study. REM percentage was 17% and the baseline portion of the study. Her AHI was 61.9 per hour with an oxyhemoglobin desaturation nadir of 75%. She was titrated on CPAP from 4-10 cm of water pressure with elimination of her sleep disordered breathing reported.    She uses a nasal pillows mask. I also reviewed older compliance data from August to Nov. 2013, during which time she was very compliant but had a high leak. At the time, she was using a FFM and leak was much better with the new mask.   Her Past Medical History Is Significant For: Past Medical History:  Diagnosis Date  .  Allergy   . Hypersomnia with sleep apnea, unspecified 05/23/2013  . OSA on CPAP 08/15/2013    Her Past Surgical History Is Significant For: Past Surgical History:  Procedure Laterality Date  . HYSTEROSCOPY W/D&C    . WISDOM TOOTH EXTRACTION      Her Family History Is Significant For: Family History  Problem Relation Age of Onset  . Diabetes Father        type 2  . Depression Father   . Dementia Father   . Narcolepsy Father        cataplexy  . High Cholesterol Father   . Stroke Maternal Grandmother   . Heart disease Maternal Grandfather     Her Social History Is Significant For: Social History   Social History  . Marital status: Single    Spouse name: N/A  . Number of children: 0  . Years of education: Post Grad   Occupational History  .      [employer]   Social History Main Topics  . Smoking status: Never Smoker  . Smokeless tobacco: Never Used  . Alcohol use No  . Drug use: No  . Sexual activity: No   Other Topics Concern  . None   Social History Narrative   Patient lives at home alone.   Caffeine Use: occasionally soda    Her Allergies Are:  Allergies   Allergen Reactions  . Latex   . Other     Seasonal   . Lubricants   :   Her Current Medications Are:  Outpatient Encounter Prescriptions as of 07/13/2017  Medication Sig  . amphetamine-dextroamphetamine (ADDERALL) 10 MG tablet Take 1 tablet (10 mg total) by mouth 2 (two) times daily as needed.  Marland Kitchen b complex vitamins capsule Take 1 capsule by mouth daily.  . Bepotastine Besilate (BEPREVE) 1.5 % SOLN Place 1 drop into both eyes once.  . betamethasone valerate lotion (VALISONE) 0.1 % Apply 1 application topically daily.  Marland Kitchen desonide (DESOWEN) 0.05 % lotion Apply 1 application topically 2 (two) times daily.  . fexofenadine (ALLEGRA) 180 MG tablet Take 180 mg by mouth daily.  Marland Kitchen gabapentin (NEURONTIN) 100 MG capsule Take 2 capsules (200 mg total) by mouth at bedtime.  . Multiple Vitamin (MULTIVITAMIN) tablet Take 1 tablet by mouth daily.  . pimecrolimus (ELIDEL) 1 % cream Apply 1 application topically 2 (two) times daily.  . Triamcinolone Acetonide (NASACORT ALLERGY 24HR) 55 MCG/ACT AERO Place 2 sprays into the nose 2 (two) times daily.  . Vitamin D, Ergocalciferol, (DRISDOL) 50000 units CAPS capsule Take 1 capsule (50,000 Units total) by mouth every 7 (seven) days.  . [DISCONTINUED] cholecalciferol (VITAMIN D) 1000 UNITS tablet Take 1,000 Units by mouth daily.  . [DISCONTINUED] pyridoxine (B-6) 100 MG tablet Take 100 mg by mouth daily.  . [DISCONTINUED] vitamin B-12 (CYANOCOBALAMIN) 100 MCG tablet Take 50 mcg by mouth daily.   No facility-administered encounter medications on file as of 07/13/2017.   :  Review of Systems:  Out of a complete 14 point review of systems, all are reviewed and negative with the exception of these symptoms as listed below: Review of Systems  Neurological:       Pt presents today to discuss her adderall and cpap. Pt reports that her cpap is going well.    Objective:  Neurological Exam  Physical Exam Physical Examination:   Vitals:   07/13/17 1047  BP:  110/60  Pulse: 68    General Examination: The patient is a very  pleasant 51 y.o. female in no acute distress. She appears well-developed and well-nourished and well groomed.   HEENT: Normocephalic, atraumatic, pupils are equal, round and reactive to light and accommodation. Extraocular tracking is good without limitation to gaze excursion or nystagmus noted. Normal smooth pursuit is noted. Hearing is grossly intact. Face is symmetric with normal facial animation and normal facial sensation. Speech is clear with no dysarthria noted. There is no hypophonia. Mildly nasal speech. There is no lip, neck/head, jaw or voice tremor. Neck with mild decrease in range of motion, tenderness reported in the left posterior neck area. Oropharynx exam reveals: mild mouth dryness, good dental hygiene and moderate airway crowding. Mallampati is class I. Tongue protrudes centrally and palate elevates symmetrically. Tonsils are 1+.  Chest: Clear to auscultation without wheezing, rhonchi or crackles noted.  Heart: S1+S2+0, regular and normal without murmurs, rubs or gallops noted.   Abdomen: Soft, non-tender and non-distended with normal bowel sounds appreciated on auscultation.  Extremities: There is no pitting edema in the distal lower extremities bilaterally. Pedal pulses are intact.  Skin: Warm and dry without trophic changes noted.  Musculoskeletal: exam reveals no obvious joint deformities, tenderness or joint swelling or erythema.   Neurologically:  Mental status: The patient is awake, alert and oriented in all 4 spheres. Her immediate and remote memory, attention, language skills and fund of knowledge are appropriate. There is no evidence of aphasia, agnosia, apraxia or anomia. Speech is clear with normal prosody and enunciation. Thought process is linear. Mood is normal and affect is normal.  Cranial nerves II - XII are as described above under HEENT exam. In addition: shoulder shrug is normal with  equal shoulder height noted. Motor exam: Normal bulk, strength and tone is noted. There is no drift, tremor or rebound. Reflexes are 1-2+ throughout. Fine motor skills and coordination: intact.  Cerebellar testing: No dysmetria or intention tremor. There is no truncal or gait ataxia.  Sensory exam: intact to light touch in the upper and lower extremities.  Gait, station and balance: She stands easily. No veering to one side is noted. No leaning to one side is noted. Posture is age-appropriate and stance is narrow based. Gait shows normal stride length and normal pace. No problems turning are noted.   Assessment and Plan:  In summary, Judye Lorino is a very pleasant 51 year old female with an underlying medical history of allergies and obesity, who returns for follow-up consultation of her OSA, well established on CPAP therapy with history of  hypersomnolence. Which she is established on a low-dose Adderall immediate release twice daily 10 mg strength. Her last refill only gave her 10 mg once daily although my prescription indicated 60 pills with twice daily dosing. She will check her bottle and check with her pharmacy again. She is fully compliant with CPAP therapy for which she is commended. Exam is stable with the exception of recent flareup of neck pain. She has a history of spondylitic changes in the C5-6 area and had an MRI some 3 years ago. She is scheduled to see her sports medicine specialist in follow-up and also scheduled to have a orthopedic consultation soon. She has been using a soft collar, heat pad, and also pursue dry needling. She found it helpful. From my end of things I suggested 6 month follow-up,. I answered all her questions today and she was in agreement.  sidual daytime somnolence, for which she was started on Adderall immediate release 10 mg once daily last time some  6 months ago and we increased it in the interim about 4 months ago to twice daily. She has some nighttime  insomnia when she takes the second dose of Adderall too late or the full dose, she has been cutting back on it or taking it earlier in the late morning of very early afternoon. She feels that it has been helpful and has no significant side effects. Physical exam is stable, she has lost a little bit of weight. I provided a new prescription for her Adderall 10 mg twice daily. She is fully compliant with CPAP therapy and commended for this. She may be eligible for new CPAP machine as the current machine maybe over 86 years old. I provided a new prescription for that. I would like to see her back in 6 months, sooner as needed. I answered all her questions today and she was in agreement.

## 2017-07-13 NOTE — Patient Instructions (Signed)
Please continue using your CPAP regularly. While your insurance requires that you use CPAP at least 4 hours each night on 70% of the nights, I recommend, that you not skip any nights and use it throughout the night if you can. Getting used to CPAP and staying with the treatment long term does take time and patience and discipline. Untreated obstructive sleep apnea when it is moderate to severe can have an adverse impact on cardiovascular health and raise her risk for heart disease, arrhythmias, hypertension, congestive heart failure, stroke and diabetes. Untreated obstructive sleep apnea causes sleep disruption, nonrestorative sleep, and sleep deprivation. This can have an impact on your day to day functioning and cause daytime sleepiness and impairment of cognitive function, memory loss, mood disturbance, and problems focussing. Using CPAP regularly can improve these symptoms.  We will keep your adderall at 10 mg 2 times a day.

## 2017-07-27 ENCOUNTER — Ambulatory Visit: Payer: Self-pay

## 2017-07-27 ENCOUNTER — Encounter: Payer: Self-pay | Admitting: Family Medicine

## 2017-07-27 ENCOUNTER — Ambulatory Visit (INDEPENDENT_AMBULATORY_CARE_PROVIDER_SITE_OTHER): Payer: BLUE CROSS/BLUE SHIELD | Admitting: Family Medicine

## 2017-07-27 VITALS — BP 118/68 | HR 72 | Wt 197.0 lb

## 2017-07-27 DIAGNOSIS — M542 Cervicalgia: Secondary | ICD-10-CM | POA: Diagnosis not present

## 2017-07-27 DIAGNOSIS — M25532 Pain in left wrist: Secondary | ICD-10-CM

## 2017-07-27 DIAGNOSIS — M5030 Other cervical disc degeneration, unspecified cervical region: Secondary | ICD-10-CM | POA: Diagnosis not present

## 2017-07-27 DIAGNOSIS — G5602 Carpal tunnel syndrome, left upper limb: Secondary | ICD-10-CM

## 2017-07-27 MED ORDER — VENLAFAXINE HCL ER 37.5 MG PO CP24: 37.5000 mg | ORAL_CAPSULE | Freq: Every day | ORAL | 1 refills | Status: DC

## 2017-07-27 NOTE — Assessment & Plan Note (Signed)
Minimal improvement. If anything worsening. EMG ordered for further evaluation.

## 2017-07-27 NOTE — Progress Notes (Signed)
[name] [address] [place], [place] [address] Phone: [phone] Subjective:     CC: Neck pain follow-up, carpal tunnel.  [id number]  Alicia [name] is a 51 y.o. female coming in for follow up for neck and wrist pain. She has not been swimming due to the fear of pain in her wrist. She still has numbness and nerve pain in the left wrist. Her forearm, thumb, 2nd and 3rd finger are numb. She has been trying stair climber but is worried about injuring her neck further. Patient was seen previously and was diagnosed with carpal tunnel and given an injection but patient states it only helped for approximately several days and now the pain seems to be worse. Seems to be radiating up towards her neck. More constant. Has had difficulty with her neck previously. Had been seen neurology for a long amount of time but feels like she needs a second opinion. We attempted osteopathic manipulation was very minimal benefit as well.    patient did have an MRI 2015. Broad based disc osteophyte complex at C5-C6  Past Medical History:  Diagnosis Date  . Allergy   . Hypersomnia with sleep apnea, unspecified 05/23/2013  . OSA on CPAP 08/15/2013   Past Surgical History:  Procedure Laterality Date  . HYSTEROSCOPY W/D&C    . WISDOM TOOTH EXTRACTION     Social History   Social History  . Marital status: Single    Spouse name: N/A  . Number of children: 0  . Years of education: Post Grad   Occupational History  .      [employer]   Social History Main Topics  . Smoking status: Never Smoker  . Smokeless tobacco: Never Used  . Alcohol use No  . Drug use: No  . Sexual activity: No   Other Topics Concern  . None   Social History Narrative   Patient lives at home alone.   Caffeine Use: occasionally soda   Allergies  Allergen Reactions  . Latex   . Other     Seasonal   . Lubricants    Family History  Problem Relation Age of Onset  .  Diabetes Father        type 2  . Depression Father   . Dementia Father   . Narcolepsy Father        cataplexy  . High Cholesterol Father   . Stroke Maternal Grandmother   . Heart disease Maternal Grandfather      Past medical history, social, surgical and family history all reviewed in electronic medical record.  No pertanent information unless stated regarding to the chief complaint.   Review of Systems:Review of systems updated and as accurate as of 07/27/17  No headache, visual changes, nausea, vomiting, diarrhea, constipation, dizziness, abdominal pain, skin rash, fevers, chills, night sweats, weight loss, swollen lymph nodes, body aches, joint swelling,  chest pain, shortness of breath, mood changes. Positive muscle aches  Objective  Blood pressure 118/68, pulse 72, weight 197 lb (89.4 kg), SpO2 98 %. Systems examined below as of 07/27/17   General: No apparent distress alert and oriented x3 mood and affect normal, dressed appropriately.  HEENT: Pupils equal, extraocular movements intact  Respiratory: Patient's speak in full sentences and does not appear short of breath  Cardiovascular: No lower extremity edema, non tender, no erythema  Skin: Warm dry intact with no signs of infection or rash on extremities or on axial skeleton.  Abdomen: Soft nontender  Neuro: Cranial nerves II through XII are intact, neurovascularly intact in all extremities with 2+ DTRs and 2+ pulses.  Lymph: No lymphadenopathy of posterior or anterior cervical chain or axillae bilaterally.  Gait normal with good balance and coordination.  MSK:  Non tender with full range of motion and good stability and symmetric strength and tone of shoulders, elbows,  hip, knee and ankles bilaterally.  Neck: Inspection of lordosis. No palpable stepoffs. Positive Spurling's maneuver. Started having increasing limited range of motion. Grip strength and sensation normal in bilateral hands Strength good C4 to T1  distribution No sensory change to C4 to T1 Negative Hoffman sign bilaterally Reflexes normal  Wrist: Left Inspection normal with no visible erythema or swelling. ROM smooth and normal with good flexion and extension and ulnar/radial deviation that is symmetrical with opposite wrist. Palpation is normal over metacarpals, navicular, lunate, and TFCC; tendons without tenderness/ swelling No snuffbox tenderness. No tenderness over Canal of Guyon. Strength 5/5 in all directions without pain. Negative Finkelstein, positive tinel's and phalens. Negative Watson's test.    Impression and Recommendations:     This case required medical decision making of moderate complexity.      Note: This dictation was prepared with Dragon dictation along with smaller phrase technology. Any transcriptional errors that result from this process are unintentional.

## 2017-07-27 NOTE — Patient Instructions (Addendum)
Good to see you  Alicia [name] is your friend.  EMG left upper extremity  Effexor 37.5 mg daily  Continue everything else See em again in 4 weeks.

## 2017-07-27 NOTE — Assessment & Plan Note (Signed)
Difficult to assess when patient seems to be having difficult he with at this time. I do believe that some is a cervical radiculopathy but I do think the patient can also have carpal tunnel. Phalen an EMG would be necessary this time to help the differential could change medical management. Patient is having more of a carpal tunnel and had failed injection and patient may need to consider surgical intervention. More of a cervical radiculopathy we may need to consider further imaging. Patient is in agreement with the plan. Will follow-up after the epidural at this time.

## 2017-07-28 ENCOUNTER — Encounter: Payer: Self-pay | Admitting: Neurology

## 2017-07-28 ENCOUNTER — Ambulatory Visit: Payer: BLUE CROSS/BLUE SHIELD | Admitting: Family Medicine

## 2017-07-28 ENCOUNTER — Ambulatory Visit (INDEPENDENT_AMBULATORY_CARE_PROVIDER_SITE_OTHER): Payer: BLUE CROSS/BLUE SHIELD | Admitting: Orthopaedic Surgery

## 2017-07-29 ENCOUNTER — Encounter: Payer: Self-pay | Admitting: Family Medicine

## 2017-07-31 ENCOUNTER — Other Ambulatory Visit: Payer: Self-pay | Admitting: *Deleted

## 2017-07-31 DIAGNOSIS — M25532 Pain in left wrist: Secondary | ICD-10-CM

## 2017-07-31 MED ORDER — VENLAFAXINE HCL ER 37.5 MG PO CP24: 37.5000 mg | ORAL_CAPSULE | Freq: Every day | ORAL | 1 refills | Status: DC

## 2017-08-03 ENCOUNTER — Ambulatory Visit (INDEPENDENT_AMBULATORY_CARE_PROVIDER_SITE_OTHER): Payer: BLUE CROSS/BLUE SHIELD | Admitting: Neurology

## 2017-08-03 DIAGNOSIS — M25532 Pain in left wrist: Secondary | ICD-10-CM | POA: Diagnosis not present

## 2017-08-03 DIAGNOSIS — M5412 Radiculopathy, cervical region: Secondary | ICD-10-CM

## 2017-08-03 NOTE — Procedures (Signed)
[facility]  [address], [address]  [place], [place] [address] Tel: [phone] Fax:  [phone] Test Date:  08/03/2017  Patient: Alicia [name] DOB: 1966/09/22 Physician: Nita Sickle, DO  Sex: Female Height:  Ref Phys: Antoine Primas, DO  ID#: [id number] Temp: 35.0C Technician:    Patient Complaints: This is a 51 year-old female referred for evaluation of left wrist pain and paresthesias.  NCV & EMG Findings: Extensive electrodiagnostic testing of the left upper extremity shows: 1. Left median, ulnar, radial, and mixed palmar sensory responses are within normal limits. 2. Left median and ulnar motor responses are within normal limits. 3. Chronic motor axon loss changes are seen affecting the left pronator teres and biceps muscles, without accompanied active denervation.  Impression: 1. Chronic C6 radiculopathy affecting the left upper extremity, mild in degree electrically. 2. There is no evidence of carpal tunnel syndrome.   ___________________________ Nita Sickle, DO    Nerve Conduction Studies Anti Sensory Summary Table   Stim Site NR Peak (ms) Norm Peak (ms) P-T Amp (V) Norm P-T Amp  Left Median Anti Sensory (2nd Digit)  35C  Wrist    2.8 <3.4 62.4 >20  Left Radial Anti Sensory (Base 1st Digit)  35C  Wrist    2.0 <2.7 33.3 >18  Left Ulnar Anti Sensory (5th Digit)  35C  Wrist    2.5 <3.1 40.4 >12   Motor Summary Table   Stim Site NR Onset (ms) Norm Onset (ms) O-P Amp (mV) Norm O-P Amp Site1 Site2 Delta-0 (ms) Dist (cm) Vel (m/s) Norm Vel (m/s)  Left Median Motor (Abd Poll Brev)  35C  Wrist    2.9 <3.9 9.0 >6 Elbow Wrist 4.1 26.0 63 >50  Elbow    7.0  9.0         Left Ulnar Motor (Abd Dig Minimi)  35C  Wrist    2.1 <3.1 8.4 >7 B Elbow Wrist 3.1 22.0 71 >50  B Elbow    5.2  7.8  A Elbow B Elbow 1.5 10.0 67 >50  A Elbow    6.7  7.7          Comparison Summary Table   Stim Site NR Peak (ms) Norm Peak (ms) P-T Amp (V) Site1  Site2 Delta-P (ms) Norm Delta (ms)  Left Median/Ulnar Palm Comparison (Wrist - 8cm)  35C  Median Palm    1.5 <2.2 39.0 Median Palm Ulnar Palm 0.0   Ulnar Palm    1.5 <2.2 13.7       EMG   Side Muscle Ins Act Fibs Psw Fasc Number Recrt Dur Dur. Amp Amp. Poly Poly. Comment  Left 1stDorInt Nml Nml Nml Nml Nml Nml Nml Nml Nml Nml Nml Nml N/A  Left Ext Indicis Nml Nml Nml Nml Nml Nml Nml Nml Nml Nml Nml Nml N/A  Left PronatorTeres Nml Nml Nml Nml 1- Rapid Some 1+ Some 1+ Nml Nml N/A  Left Biceps Nml Nml Nml Nml 1- Rapid Some 1+ Some 1+ Nml Nml N/A  Left Triceps Nml Nml Nml Nml Nml Nml Nml Nml Nml Nml Nml Nml N/A  Left Deltoid Nml Nml Nml Nml Nml Nml Nml Nml Nml Nml Nml Nml N/A      Waveforms:

## 2017-08-04 DIAGNOSIS — G4733 Obstructive sleep apnea (adult) (pediatric): Secondary | ICD-10-CM | POA: Diagnosis not present

## 2017-08-24 ENCOUNTER — Ambulatory Visit (INDEPENDENT_AMBULATORY_CARE_PROVIDER_SITE_OTHER): Payer: BLUE CROSS/BLUE SHIELD | Admitting: Family Medicine

## 2017-08-24 ENCOUNTER — Encounter: Payer: Self-pay | Admitting: Family Medicine

## 2017-08-24 ENCOUNTER — Ambulatory Visit (INDEPENDENT_AMBULATORY_CARE_PROVIDER_SITE_OTHER)
Admission: RE | Admit: 2017-08-24 | Discharge: 2017-08-24 | Disposition: A | Discharge status: Home / Self Care | Payer: BLUE CROSS/BLUE SHIELD | Source: Ambulatory Visit | Attending: Family Medicine | Admitting: Family Medicine

## 2017-08-24 VITALS — BP 100/80 | HR 66 | Ht 65.0 in | Wt 194.0 lb

## 2017-08-24 DIAGNOSIS — M542 Cervicalgia: Secondary | ICD-10-CM

## 2017-08-24 DIAGNOSIS — M5030 Other cervical disc degeneration, unspecified cervical region: Secondary | ICD-10-CM | POA: Diagnosis not present

## 2017-08-24 DIAGNOSIS — M47812 Spondylosis without myelopathy or radiculopathy, cervical region: Secondary | ICD-10-CM | POA: Diagnosis not present

## 2017-08-24 NOTE — Patient Instructions (Addendum)
Good to see you  Ice is your friend Continue the gabapentin  Lets get the xray downstairs today  We ordered a new MRI today  After the MRI I will likely order a epidural  I would like to see you again 2-3 weeks after the epidural then to see how you are doing.

## 2017-08-24 NOTE — Assessment & Plan Note (Signed)
Known degenerative disc with worsening symptoms and now weakness of the upper extremity. I'm concerned with patient having a C6 nerve impingement. X-rays have been ordered and we will get an MRI as well. Patient would be a candidate for epidural steroid injections. Patient has unfortunately had difficulty with multiple different medications. Patient will follow-up with me 2 weeks after the epidural if this is potentially ordered after the MRI. Patient would also consider surgical intervention if necessary.

## 2017-08-24 NOTE — Progress Notes (Signed)
[name] [address] [place], [place] [address] Phone: [phone] Subjective:    I'm seeing this patient by the request  of:    CC: Left arm pain and neck pain follow-up  BJY:[id number]  Alicia [name] is a 51 y.o. female coming in with complaint of left arm and neck pain. Patient was having signs and symptoms more consistent with carpal tunnel but EMG has prevented was more of a C6 radicular symptoms. Patient has an MRI that has been independently visualized by me from 2015 showing cervical arthritic changes most prominent at C5-C6. Patient was to get x-rays of the neck which she has not done. Patient was to start Effexor. Patient states Unable to tolerate the Effexor. Patient is having worsening symptoms again. Maybe some mild weakness of the left hand as well.      Past Medical History:  Diagnosis Date  . Allergy   . Hypersomnia with sleep apnea, unspecified 05/23/2013  . OSA on CPAP 08/15/2013   Past Surgical History:  Procedure Laterality Date  . HYSTEROSCOPY W/D&C    . WISDOM TOOTH EXTRACTION     Social History   Social History  . Marital status: Single    Spouse name: N/A  . Number of children: 0  . Years of education: Post Grad   Occupational History  .      [employer]   Social History Main Topics  . Smoking status: Never Smoker  . Smokeless tobacco: Never Used  . Alcohol use No  . Drug use: No  . Sexual activity: No   Other Topics Concern  . None   Social History Narrative   Patient lives at home alone.   Caffeine Use: occasionally soda   Allergies  Allergen Reactions  . Latex   . Other     Seasonal   . Lubricants    Family History  Problem Relation Age of Onset  . Diabetes Father        type 2  . Depression Father   . Dementia Father   . Narcolepsy Father        cataplexy  . High Cholesterol Father   . Stroke Maternal Grandmother   . Heart disease Maternal Grandfather      Past medical  history, social, surgical and family history all reviewed in electronic medical record.  No pertanent information unless stated regarding to the chief complaint.   Review of Systems:Review of systems updated and as accurate as of 08/24/17  No headache, visual changes, nausea, vomiting, diarrhea, constipation, dizziness, abdominal pain, skin rash, fevers, chills, night sweats, weight loss, swollen lymph nodes, body aches, joint swelling, muscle aches, chest pain, shortness of breath, mood changes. Positive muscle aches, some mild muscle weakness  Objective  Blood pressure 100/80, pulse 66, height 5\' 5"  (1.651 m), weight 194 lb (88 kg), SpO2 98 %. Systems examined below as of 08/24/17   General: No apparent distress alert and oriented x3 mood and affect normal, dressed appropriately.  HEENT: Pupils equal, extraocular movements intact  Respiratory: Patient's speak in full sentences and does not appear short of breath  Cardiovascular: No lower extremity edema, non tender, no erythema  Skin: Warm dry intact with no signs of infection or rash on extremities or on axial skeleton.  Abdomen: Soft nontender  Neuro: Cranial nerves II through XII are intact, neurovascularly intact in all extremities with 2+ DTRs and 2+ pulses.  Lymph: No lymphadenopathy of posterior or anterior cervical chain or  axillae bilaterally.  Gait normal with good balance and coordination.  MSK:  Non tender with full range of motion and good stability and symmetric strength and tone of shoulders, elbows, wrist, hip, knee and ankles bilaterally.  Neck: Inspection loss lordosis. No palpable stepoffs. Positive Spurling's maneuver  Mild limitation lacking the last 10 of extension and sidebending. Grip strength and sensation normal in bilateral hands Mild weakness on the left side with a C6 distribution No sensory change to C4 to T1 Negative Hoffman sign bilaterally Reflexes normal      Impression and Recommendations:      This case required medical decision making of moderate complexity.      Note: This dictation was prepared with Dragon dictation along with smaller phrase technology. Any transcriptional errors that result from this process are unintentional.

## 2017-09-08 ENCOUNTER — Ambulatory Visit
Admission: RE | Admit: 2017-09-08 | Discharge: 2017-09-08 | Disposition: A | Discharge status: Home / Self Care | Payer: BLUE CROSS/BLUE SHIELD | Source: Ambulatory Visit | Attending: Family Medicine | Admitting: Family Medicine

## 2017-09-08 DIAGNOSIS — M50223 Other cervical disc displacement at C6-C7 level: Secondary | ICD-10-CM | POA: Diagnosis not present

## 2017-09-08 DIAGNOSIS — M542 Cervicalgia: Secondary | ICD-10-CM

## 2017-09-13 ENCOUNTER — Ambulatory Visit (INDEPENDENT_AMBULATORY_CARE_PROVIDER_SITE_OTHER): Payer: BLUE CROSS/BLUE SHIELD | Admitting: Podiatry

## 2017-09-13 ENCOUNTER — Encounter: Payer: Self-pay | Admitting: Podiatry

## 2017-09-13 DIAGNOSIS — M2042 Other hammer toe(s) (acquired), left foot: Secondary | ICD-10-CM

## 2017-09-15 NOTE — Progress Notes (Signed)
Subjective:    Patient ID: Alicia [name], female   DOB: 51 y.o.   MRN: [id number]   HPI patient presents stating that I have this really painful lesion on my third toe and I need new orthotics due to chronic heel pain    ROS      Objective:  Physical Exam neurovascular status intact with patient found to have a distal lateral distal keratotic lesion digit 3 left and moderate discomfort in the plantar fashion into the arch bilateral     Assessment:    Plantar fasciitis present with hammertoe deformity and distal lateral keratotic lesion     Plan:    H&P conditions reviewed and at this time patient will have a second pair of orthotics made that we'll be of a more low profile which I believe will take pressure off the foot. Patient had the lesion debrided and may ultimately require hammertoe repair

## 2017-09-20 DIAGNOSIS — J301 Allergic rhinitis due to pollen: Secondary | ICD-10-CM | POA: Diagnosis not present

## 2017-09-21 ENCOUNTER — Ambulatory Visit (INDEPENDENT_AMBULATORY_CARE_PROVIDER_SITE_OTHER): Payer: BLUE CROSS/BLUE SHIELD | Admitting: Family Medicine

## 2017-09-21 ENCOUNTER — Encounter: Payer: Self-pay | Admitting: Family Medicine

## 2017-09-21 VITALS — BP 110/70 | HR 92 | Ht 65.65 in | Wt 195.0 lb

## 2017-09-21 DIAGNOSIS — M5030 Other cervical disc degeneration, unspecified cervical region: Secondary | ICD-10-CM

## 2017-09-21 DIAGNOSIS — M542 Cervicalgia: Secondary | ICD-10-CM | POA: Diagnosis not present

## 2017-09-21 DIAGNOSIS — J3089 Other allergic rhinitis: Secondary | ICD-10-CM | POA: Diagnosis not present

## 2017-09-21 DIAGNOSIS — J3081 Allergic rhinitis due to animal (cat) (dog) hair and dander: Secondary | ICD-10-CM | POA: Diagnosis not present

## 2017-09-21 DIAGNOSIS — J301 Allergic rhinitis due to pollen: Secondary | ICD-10-CM | POA: Diagnosis not present

## 2017-09-21 NOTE — Progress Notes (Signed)
Alicia [name] D.O. [facility] [address] [place], [place] [address] Phone: [phone] Subjective:    I'm seeing this patient by the request  of:    CC: Neck pain.  BJY:NWGNFAOZHYHPI:Subjective  Alicia Tanna FurryKilimanjaro is a 51 y.o. female coming in with complaint of neck pain.  Patient has had significant amount of pain for quite some time.  Patient cervical neck x-ray showed some also arthritic changes in.  Patient was sent for an MRI of the cervical spine.  MRI was independently visualized by me showing moderate foraminal encroachment bilaterally at C5-C6.  This would correspond to patient's symptoms.  Having worsening still affecting daily activities including her job.      Past Medical History:  Diagnosis Date  . Allergy   . Hypersomnia with sleep apnea, unspecified 05/23/2013  . OSA on CPAP 08/15/2013   Past Surgical History:  Procedure Laterality Date  . HYSTEROSCOPY W/D&C    . WISDOM TOOTH EXTRACTION     Social History   Socioeconomic History  . Marital status: Single    Spouse name: None  . Number of children: 0  . Years of education: Post Grad  . Highest education level: None  Social Needs  . Financial resource strain: None  . Food insecurity - worry: None  . Food insecurity - inability: None  . Transportation needs - medical: None  . Transportation needs - non-medical: None  Occupational History    Comment: [employer]  Tobacco Use  . Smoking status: Never Smoker  . Smokeless tobacco: Never Used  Substance and Sexual Activity  . Alcohol use: No  . Drug use: No  . Sexual activity: No  Other Topics Concern  . None  Social History Narrative   Patient lives at home alone.   Caffeine Use: occasionally soda   Allergies  Allergen Reactions  . Latex   . Other     Seasonal   . Lubricants    Family History  Problem Relation Age of Onset  . Diabetes Father        type 2  . Depression Father   . Dementia Father   . Narcolepsy Father    cataplexy  . High Cholesterol Father   . Stroke Maternal Grandmother   . Heart disease Maternal Grandfather      Past medical history, social, surgical and family history all reviewed in electronic medical record.  No pertanent information unless stated regarding to the chief complaint.   Review of Systems:Review of systems updated and as accurate as of 09/21/17  No  visual changes, nausea, vomiting, diarrhea, constipation, dizziness, abdominal pain, skin rash, fevers, chills, night sweats, weight loss, swollen lymph nodes, body aches, joint swelling, chest pain, shortness of breath, mood changes.  Positive headaches, muscle aches  Objective  Blood pressure 110/70, pulse 92, height 5' 5.65" (1.668 m), weight 195 lb (88.5 kg), SpO2 97 %. Systems examined below as of 09/21/17   General: No apparent distress alert and oriented x3 mood and affect normal, dressed appropriately.  HEENT: Pupils equal, extraocular movements intact  Respiratory: Patient's speak in full sentences and does not appear short of breath  Cardiovascular: No lower extremity edema, non tender, no erythema  Skin: Warm dry intact with no signs of infection or rash on extremities or on axial skeleton.  Abdomen: Soft nontender  Neuro: Cranial nerves II through XII are intact, neurovascularly intact in all extremities with 2+ DTRs and 2+ pulses.  Lymph: No lymphadenopathy of posterior or anterior  cervical chain or axillae bilaterally.  Gait normal with good balance and coordination.  MSK:  Non tender with full range of motion and good stability and symmetric strength and tone of shoulders, elbows, wrist, hip, knee and ankles bilaterally.  Neck: Inspection unremarkable. No palpable stepoffs. Mild positive Spurling's maneuver. Decreased range of motion in all planes especially with extension and sidebending bilaterally Grip strength and sensation normal in bilateral hands 4 out of 5 grip strength in the C6 distribution left  worse than right Negative Hoffman sign bilaterally Reflexes normal    Impression and Recommendations:     This case required medical decision making of moderate complexity.      Note: This dictation was prepared with Dragon dictation along with smaller phrase technology. Any transcriptional errors that result from this process are unintentional.

## 2017-09-21 NOTE — Assessment & Plan Note (Signed)
Spent  25 minutes with patient face-to-face and had greater than 50% of counseling including as described in assessment and plan.  Patient does have degenerative disc disease as well as facet arthropathy of the cervical spine.  I do feel that a possible epidural could be diagnostic as well as beneficial.  Patient did not respond well to the carpal tunnel injections previously.  Hopefully this will help with some of the radicular symptoms that she is having.  With me again in 2-3 weeks after injection.

## 2017-09-21 NOTE — Patient Instructions (Addendum)
Good to see you  Alicia [name] is your friend.  We will get epidural and see you at the end of December  See me again in 4 weeks

## 2017-10-03 ENCOUNTER — Encounter: Payer: Self-pay | Admitting: Family Medicine

## 2017-10-03 ENCOUNTER — Other Ambulatory Visit: Payer: Self-pay | Admitting: Neurology

## 2017-10-03 DIAGNOSIS — G4733 Obstructive sleep apnea (adult) (pediatric): Secondary | ICD-10-CM | POA: Diagnosis not present

## 2017-10-03 DIAGNOSIS — G4730 Sleep apnea, unspecified: Secondary | ICD-10-CM

## 2017-10-03 DIAGNOSIS — Z9989 Dependence on other enabling machines and devices: Secondary | ICD-10-CM

## 2017-10-03 DIAGNOSIS — G4710 Hypersomnia, unspecified: Secondary | ICD-10-CM

## 2017-10-04 ENCOUNTER — Telehealth: Payer: Self-pay

## 2017-10-04 DIAGNOSIS — G4733 Obstructive sleep apnea (adult) (pediatric): Secondary | ICD-10-CM | POA: Diagnosis not present

## 2017-10-04 MED ORDER — AMPHETAMINE-DEXTROAMPHETAMINE 10 MG PO TABS: 10.0000 mg | ORAL_TABLET | Freq: Two times a day (BID) | ORAL | 0 refills | Status: DC | PRN

## 2017-10-04 NOTE — Telephone Encounter (Signed)
I called pt and advised her that her RX for adderall is ready for pick up at the front desk. Pt verbalized understanding.

## 2017-10-05 DIAGNOSIS — J3089 Other allergic rhinitis: Secondary | ICD-10-CM | POA: Diagnosis not present

## 2017-10-05 DIAGNOSIS — J301 Allergic rhinitis due to pollen: Secondary | ICD-10-CM | POA: Diagnosis not present

## 2017-10-09 ENCOUNTER — Encounter: Payer: BLUE CROSS/BLUE SHIELD | Admitting: Orthotics

## 2017-10-09 DIAGNOSIS — J3089 Other allergic rhinitis: Secondary | ICD-10-CM | POA: Diagnosis not present

## 2017-10-09 DIAGNOSIS — J301 Allergic rhinitis due to pollen: Secondary | ICD-10-CM | POA: Diagnosis not present

## 2017-10-12 ENCOUNTER — Encounter: Payer: BLUE CROSS/BLUE SHIELD | Admitting: Orthotics

## 2017-10-16 ENCOUNTER — Ambulatory Visit
Admission: RE | Admit: 2017-10-16 | Discharge: 2017-10-16 | Disposition: A | Discharge status: Home / Self Care | Payer: BLUE CROSS/BLUE SHIELD | Source: Ambulatory Visit | Attending: Family Medicine | Admitting: Family Medicine

## 2017-10-16 DIAGNOSIS — M542 Cervicalgia: Secondary | ICD-10-CM

## 2017-10-16 DIAGNOSIS — M47812 Spondylosis without myelopathy or radiculopathy, cervical region: Secondary | ICD-10-CM | POA: Diagnosis not present

## 2017-10-16 MED ORDER — TRIAMCINOLONE ACETONIDE 40 MG/ML IJ SUSP (RADIOLOGY)
60.0000 mg | Freq: Once | INTRAMUSCULAR | Status: AC
  Administered 2017-10-16: 60 mg via EPIDURAL

## 2017-10-16 MED ORDER — IOPAMIDOL (ISOVUE-M 300) INJECTION 61%
1.0000 mL | Freq: Once | INTRAMUSCULAR | Status: AC | PRN
  Administered 2017-10-16: 1 mL via EPIDURAL

## 2017-10-16 NOTE — Discharge Instructions (Signed)

## 2017-10-18 NOTE — Progress Notes (Signed)
[facility] Scale[facility] Panagiotis Oelkers D.O. [facility] [address] [place], [place] [address] Phone: [phone] Subjective:    I'm seeing this patient by the request  of:    CC: Neck pain follow-up  BJY:NWGNFAOZHYHPI:Subjective  Alicia [name] is a 51 y.o. female coming in with complaint of pain.  Patient was having radicular symptoms.  Attempted a carpal tunnel injection with minimal improvement.  Sent for an MRI of the cervical spine.  Patient was found to have moderate foraminal encroachment bilaterally at C5-C6 and a disc protrusion worsening at that level from previous imaging.  Patient did have an epidural at C7-T1 on October 16, 2017.  Patient states doing approximately 30% better.  Patient states that she has noticed that she is having some creasing and strength again which she is happy about.  Patient was been unable to start increasing activity of secondary to pain. Patient was having more increasing and low back pain.  States that no radiation down the legs.  Denies any numbness or tingling.  Rates the severity of pain is 5 out of 10.  Patient feels that this is continuing to keep her from exercising more at this time.       Past Medical History:  Diagnosis Date  . Allergy   . Hypersomnia with sleep apnea, unspecified 05/23/2013  . OSA on CPAP 08/15/2013   Past Surgical History:  Procedure Laterality Date  . HYSTEROSCOPY W/D&C    . WISDOM TOOTH EXTRACTION     Social History   Socioeconomic History  . Marital status: Single    Spouse name: None  . Number of children: 0  . Years of education: Post Grad  . Highest education level: None  Social Needs  . Financial resource strain: None  . Food insecurity - worry: None  . Food insecurity - inability: None  . Transportation needs - medical: None  . Transportation needs - non-medical: None  Occupational History    Comment: [employer]  Tobacco Use  . Smoking status: Never Smoker  . Smokeless tobacco: Never Used  Substance and  Sexual Activity  . Alcohol use: No  . Drug use: No  . Sexual activity: No  Other Topics Concern  . None  Social History Narrative   Patient lives at home alone.   Caffeine Use: occasionally soda   Allergies  Allergen Reactions  . Latex   . Other     Seasonal   . Lubricants    Family History  Problem Relation Age of Onset  . Diabetes Father        type 2  . Depression Father   . Dementia Father   . Narcolepsy Father        cataplexy  . High Cholesterol Father   . Stroke Maternal Grandmother   . Heart disease Maternal Grandfather      Past medical history, social, surgical and family history all reviewed in electronic medical record.  No pertanent information unless stated regarding to the chief complaint.   Review of Systems:Review of systems updated and as accurate as of 10/19/17  No headache, visual changes, nausea, vomiting, diarrhea, constipation, dizziness, abdominal pain, skin rash, fevers, chills, night sweats, weight loss, swollen lymph nodes, body aches, joint swelling, , chest pain, shortness of breath, mood changes.  Positive muscle aches  Objective  Blood pressure 110/62, pulse 79, height 5\' 5"  (1.651 m), weight 204 lb (92.5 kg), SpO2 98 %. Systems examined below as of 10/19/17   General: No apparent distress  alert and oriented x3 mood and affect normal, dressed appropriately.  HEENT: Pupils equal, extraocular movements intact  Respiratory: Patient's speak in full sentences and does not appear short of breath  Cardiovascular: No lower extremity edema, non tender, no erythema  Skin: Warm dry intact with no signs of infection or rash on extremities or on axial skeleton.  Abdomen: Soft nontender  Neuro: Cranial nerves II through XII are intact, neurovascularly intact in all extremities with 2+ DTRs and 2+ pulses.  Lymph: No lymphadenopathy of posterior or anterior cervical chain or axillae bilaterally.  Gait normal with good balance and coordination.  MSK:   Non tender with full range of motion and good stability and symmetric strength and tone of shoulders, elbows, wrist, hip, knee and ankles bilaterally.  Neck: Inspection mild loss of lordosis. No palpable stepoffs. Positive Spurling's maneuver noted with mild radicular symptoms down the left arm. Mild limitation lacking the last 10 degrees of extension as well as 5 degrees of sidebending Grip strength and sensation normal in bilateral hands Strength good C4 to T1 distribution No sensory change to C4 to T1 Negative Hoffman sign bilaterally Reflexes normal Very mild scapular dyskinesis on the left  Patient's low back shows some tightness with some mild loss of lordosis.  Positive Faber test but negative straight leg test.  Mild paraspinal musculature lumbar spine right greater than left.  Full strength in lower extremities with deep tendon reflexes intact and symmetric  [id number]; 15 additional minutes spent for Therapeutic exercises as stated in above notes.  This included exercises focusing on stretching, strengthening, with significant focus on eccentric aspects.   Long term goals include an improvement in range of motion, strength, endurance as well as avoiding reinjury. Patient's frequency would include in 1-2 times a day, 3-5 times a week for a duration of 6-12 weeks. Low back exercises that included:  Pelvic tilt/bracing instruction to focus on control of the pelvic girdle and lower abdominal muscles  Glute strengthening exercises, focusing on proper firing of the glutes without engaging the low back muscles Proper stretching techniques for maximum relief for the hamstrings, hip flexors, low back and some rotation where tolerated    Proper technique shown and discussed handout in great detail with ATC.  All questions were discussed and answered.     Impression and Recommendations:     This case required medical decision making of moderate complexity.      Note: This dictation was  prepared with Dragon dictation along with smaller phrase technology. Any transcriptional errors that result from this process are unintentional.

## 2017-10-19 ENCOUNTER — Encounter: Payer: Self-pay | Admitting: Family Medicine

## 2017-10-19 ENCOUNTER — Ambulatory Visit (INDEPENDENT_AMBULATORY_CARE_PROVIDER_SITE_OTHER)
Admission: RE | Admit: 2017-10-19 | Discharge: 2017-10-19 | Disposition: A | Discharge status: Home / Self Care | Payer: BLUE CROSS/BLUE SHIELD | Source: Ambulatory Visit | Attending: Family Medicine | Admitting: Family Medicine

## 2017-10-19 ENCOUNTER — Ambulatory Visit (INDEPENDENT_AMBULATORY_CARE_PROVIDER_SITE_OTHER): Payer: BLUE CROSS/BLUE SHIELD | Admitting: Family Medicine

## 2017-10-19 VITALS — BP 110/62 | HR 79 | Ht 65.0 in | Wt 204.0 lb

## 2017-10-19 DIAGNOSIS — J301 Allergic rhinitis due to pollen: Secondary | ICD-10-CM | POA: Diagnosis not present

## 2017-10-19 DIAGNOSIS — G8929 Other chronic pain: Secondary | ICD-10-CM

## 2017-10-19 DIAGNOSIS — M5030 Other cervical disc degeneration, unspecified cervical region: Secondary | ICD-10-CM

## 2017-10-19 DIAGNOSIS — J3089 Other allergic rhinitis: Secondary | ICD-10-CM | POA: Diagnosis not present

## 2017-10-19 DIAGNOSIS — M545 Low back pain, unspecified: Secondary | ICD-10-CM | POA: Insufficient documentation

## 2017-10-19 DIAGNOSIS — J3081 Allergic rhinitis due to animal (cat) (dog) hair and dander: Secondary | ICD-10-CM | POA: Diagnosis not present

## 2017-10-19 NOTE — Assessment & Plan Note (Signed)
Patient has more low back pain.  We discussed icing regimen and home exercises.  We discussed which activities of doing which wants to avoid.  Patient will start to increase activity slowly over the course the next several days.  Follow-up with me again in 4-8 weeks since symptoms possible advanced imaging may be warranted.

## 2017-10-19 NOTE — Patient Instructions (Addendum)
Good to see you  Alicia [name] is your friend.  Exercises 3 times a week.  Xray of your back downstairs effexor try again for 10 days See me again in 3 weeks if doing better and we will discuss health and wellness.

## 2017-10-19 NOTE — Assessment & Plan Note (Signed)
Stable.  We will continue to monitor.  Patient did do well with the epidural and is only 3 days out from it.  Hopefully will notice more improvement recently.  Encourage her to continue the gabapentin on a regular basis.  Patient also encouraged to try the Effexor on a more regular basis.  Patient will follow up with me again in 3 weeks to make sure patient is responding to the conservative therapy.

## 2017-10-30 ENCOUNTER — Ambulatory Visit: Payer: BLUE CROSS/BLUE SHIELD | Admitting: Orthotics

## 2017-10-30 DIAGNOSIS — M722 Plantar fascial fibromatosis: Secondary | ICD-10-CM

## 2017-10-30 DIAGNOSIS — M2042 Other hammer toe(s) (acquired), left foot: Secondary | ICD-10-CM

## 2017-10-30 NOTE — Progress Notes (Signed)
Patient came in today to pick up custom made foot orthotics.  The goals were accomplished and the patient reported no dissatisfaction with said orthotics.  Patient was advised of breakin period and how to report any issues. 

## 2017-11-02 ENCOUNTER — Other Ambulatory Visit: Payer: Self-pay | Admitting: *Deleted

## 2017-11-02 MED ORDER — GABAPENTIN 100 MG PO CAPS: 200.0000 mg | ORAL_CAPSULE | Freq: Every day | ORAL | 1 refills | Status: DC

## 2017-11-02 NOTE — Telephone Encounter (Signed)
Refill done.  

## 2017-11-04 DIAGNOSIS — G4733 Obstructive sleep apnea (adult) (pediatric): Secondary | ICD-10-CM | POA: Diagnosis not present

## 2017-11-08 NOTE — Progress Notes (Deleted)
[name] [address] [place], [place] [address] Phone: [phone] Subjective:    I'm seeing this patient by the request  of:    CC:   [id number]  Danniel Burnette is a 52 y.o. female coming in with complaint of ***  Onset-  Location Duration-  Character- Aggravating factors- Reliving factors-  Therapies tried-  Severity-     Past Medical History:  Diagnosis Date  . Allergy   . Hypersomnia with sleep apnea, unspecified 05/23/2013  . OSA on CPAP 08/15/2013   Past Surgical History:  Procedure Laterality Date  . HYSTEROSCOPY W/D&C    . WISDOM TOOTH EXTRACTION     Social History   Socioeconomic History  . Marital status: Single    Spouse name: Not on file  . Number of children: 0  . Years of education: Post Grad  . Highest education level: Not on file  Social Needs  . Financial resource strain: Not on file  . Food insecurity - worry: Not on file  . Food insecurity - inability: Not on file  . Transportation needs - medical: Not on file  . Transportation needs - non-medical: Not on file  Occupational History    Comment: [employer]  Tobacco Use  . Smoking status: Never Smoker  . Smokeless tobacco: Never Used  Substance and Sexual Activity  . Alcohol use: No  . Drug use: No  . Sexual activity: No  Other Topics Concern  . Not on file  Social History Narrative   Patient lives at home alone.   Caffeine Use: occasionally soda   Allergies  Allergen Reactions  . Latex   . Other     Seasonal   . Lubricants    Family History  Problem Relation Age of Onset  . Diabetes Father        type 2  . Depression Father   . Dementia Father   . Narcolepsy Father        cataplexy  . High Cholesterol Father   . Stroke Maternal Grandmother   . Heart disease Maternal Grandfather      Past medical history, social, surgical and family history all reviewed in electronic medical record.  No pertanent information unless  stated regarding to the chief complaint.   Review of Systems:Review of systems updated and as accurate as of 11/08/17  No headache, visual changes, nausea, vomiting, diarrhea, constipation, dizziness, abdominal pain, skin rash, fevers, chills, night sweats, weight loss, swollen lymph nodes, body aches, joint swelling, muscle aches, chest pain, shortness of breath, mood changes.   Objective  There were no vitals taken for this visit. Systems examined below as of 11/08/17   General: No apparent distress alert and oriented x3 mood and affect normal, dressed appropriately.  HEENT: Pupils equal, extraocular movements intact  Respiratory: Patient's speak in full sentences and does not appear short of breath  Cardiovascular: No lower extremity edema, non tender, no erythema  Skin: Warm dry intact with no signs of infection or rash on extremities or on axial skeleton.  Abdomen: Soft nontender  Neuro: Cranial nerves II through XII are intact, neurovascularly intact in all extremities with 2+ DTRs and 2+ pulses.  Lymph: No lymphadenopathy of posterior or anterior cervical chain or axillae bilaterally.  Gait normal with good balance and coordination.  MSK:  Non tender with full range of motion and good stability and symmetric strength and tone of shoulders, elbows, wrist, hip, knee and ankles bilaterally.  Impression and Recommendations:     This case required medical decision making of moderate complexity.      Note: This dictation was prepared with Dragon dictation along with smaller phrase technology. Any transcriptional errors that result from this process are unintentional.       '

## 2017-11-09 ENCOUNTER — Ambulatory Visit: Payer: BLUE CROSS/BLUE SHIELD | Admitting: Family Medicine

## 2017-11-20 NOTE — Progress Notes (Signed)
Alicia [name] D.O. [facility] [address] [place], [place] [address] Phone: [phone] Subjective:     CC: Back pain follow-up  HQI:[id number]:Subjective  Alicia [name] is a 52 y.o. female coming in with complaint of neck pain.  An MRI of the cervical spine found to have moderate foraminal encroachment at C5-C6 and an epidural at C7-T1 October 16, 2017.  Was doing approximately 30% better.  Has had injections for carpal tunnel previously as well.  Patient states that she has been going to the gym. She said that her back and neck did ok with the activity. Her left hand and wrist are tender from sleeping on her wrist.       Past Medical History:  Diagnosis Date  . Allergy   . Hypersomnia with sleep apnea, unspecified 05/23/2013  . OSA on CPAP 08/15/2013   Past Surgical History:  Procedure Laterality Date  . HYSTEROSCOPY W/D&C    . WISDOM TOOTH EXTRACTION     Social History   Socioeconomic History  . Marital status: Single    Spouse name: None  . Number of children: 0  . Years of education: Post Grad  . Highest education level: None  Social Needs  . Financial resource strain: None  . Food insecurity - worry: None  . Food insecurity - inability: None  . Transportation needs - medical: None  . Transportation needs - non-medical: None  Occupational History    Comment: [employer]  Tobacco Use  . Smoking status: Never Smoker  . Smokeless tobacco: Never Used  Substance and Sexual Activity  . Alcohol use: No  . Drug use: No  . Sexual activity: No  Other Topics Concern  . None  Social History Narrative   Patient lives at home alone.   Caffeine Use: occasionally soda   Allergies  Allergen Reactions  . Latex   . Other     Seasonal   . Lubricants    Family History  Problem Relation Age of Onset  . Diabetes Father        type 2  . Depression Father   . Dementia Father   . Narcolepsy Father        cataplexy  . High Cholesterol Father   . Stroke  Maternal Grandmother   . Heart disease Maternal Grandfather      Past medical history, social, surgical and family history all reviewed in electronic medical record.  No pertanent information unless stated regarding to the chief complaint.   Review of Systems:Review of systems updated and as accurate as of 11/21/17  No headache, visual changes, nausea, vomiting, diarrhea, constipation, dizziness, abdominal pain, skin rash, fevers, chills, night sweats, weight loss, swollen lymph nodes, body aches, joint swelling, muscle aches, chest pain, shortness of breath, mood changes.   Objective  Blood pressure 110/80, pulse 85, weight 197 lb (89.4 kg), SpO2 98 %. Systems examined below as of 11/21/17   General: No apparent distress alert and oriented x3 mood and affect normal, dressed appropriately.  HEENT: Pupils equal, extraocular movements intact  Respiratory: Patient's speak in full sentences and does not appear short of breath  Cardiovascular: No lower extremity edema, non tender, no erythema  Skin: Warm dry intact with no signs of infection or rash on extremities or on axial skeleton.  Abdomen: Soft nontender  Neuro: Cranial nerves II through XII are intact, neurovascularly intact in all extremities with 2+ DTRs and 2+ pulses.  Lymph: No lymphadenopathy of posterior or anterior cervical chain  or axillae bilaterally.  Gait normal with good balance and coordination.  MSK:  Non tender with full range of motion and good stability and symmetric strength and tone of shoulders, elbows, , hip, knee and ankles bilaterally.  Neck exam still shows significant decrease in range of motion.  Patient does have some.  Still has a positive Spurling's on the left side.  Patient though is less tender than previous exam. Positive Tinel's on the left wrist but full range of motion.  Lower back exam shows the patient still has some tenderness to palpation over the sacroiliac joint in the lumbar area bilateral  paraspinal musculature.  Positive Faber on the left side.  Negative straight leg test.    Impression and Recommendations:     This case required medical decision making of moderate complexity.      Note: This dictation was prepared with Dragon dictation along with smaller phrase technology. Any transcriptional errors that result from this process are unintentional.

## 2017-11-21 ENCOUNTER — Ambulatory Visit (INDEPENDENT_AMBULATORY_CARE_PROVIDER_SITE_OTHER): Payer: BLUE CROSS/BLUE SHIELD | Admitting: Family Medicine

## 2017-11-21 ENCOUNTER — Encounter: Payer: Self-pay | Admitting: Family Medicine

## 2017-11-21 DIAGNOSIS — M545 Low back pain: Secondary | ICD-10-CM

## 2017-11-21 DIAGNOSIS — G8929 Other chronic pain: Secondary | ICD-10-CM

## 2017-11-21 DIAGNOSIS — M5030 Other cervical disc degeneration, unspecified cervical region: Secondary | ICD-10-CM

## 2017-11-21 DIAGNOSIS — G5602 Carpal tunnel syndrome, left upper limb: Secondary | ICD-10-CM

## 2017-11-21 NOTE — Assessment & Plan Note (Signed)
Continues to have difficulty.  We encourage patient potential injection.  We discussed icing regimen.  Follow-up with me again 4 weeks

## 2017-11-21 NOTE — Assessment & Plan Note (Signed)
Degenerative disc disease.  We discussed icing regimen and home exercise.  We discussed which activities of doing which wants to avoid.  Patient will increase activity slowly over the course the next several days.  Follow-up in 3 months

## 2017-11-21 NOTE — Patient Instructions (Signed)
Good to see you  Wear the brace at night Ice is your friend.  Continue all the meds Keep working at it  I am proud of you  See me again in 3 months

## 2017-11-21 NOTE — Assessment & Plan Note (Signed)
Stable at this time.  Doing relatively well.  Patient had difficulty with the Effexor.  Continue with the gabapentin at night.  Follow-up with me in 3 months

## 2017-12-05 DIAGNOSIS — G4733 Obstructive sleep apnea (adult) (pediatric): Secondary | ICD-10-CM | POA: Diagnosis not present

## 2017-12-06 DIAGNOSIS — J3081 Allergic rhinitis due to animal (cat) (dog) hair and dander: Secondary | ICD-10-CM | POA: Diagnosis not present

## 2017-12-06 DIAGNOSIS — J3089 Other allergic rhinitis: Secondary | ICD-10-CM | POA: Diagnosis not present

## 2017-12-06 DIAGNOSIS — J301 Allergic rhinitis due to pollen: Secondary | ICD-10-CM | POA: Diagnosis not present

## 2017-12-24 ENCOUNTER — Other Ambulatory Visit: Payer: Self-pay | Admitting: Neurology

## 2017-12-24 DIAGNOSIS — G4733 Obstructive sleep apnea (adult) (pediatric): Secondary | ICD-10-CM

## 2017-12-24 DIAGNOSIS — G4710 Hypersomnia, unspecified: Secondary | ICD-10-CM

## 2017-12-24 DIAGNOSIS — G4730 Sleep apnea, unspecified: Secondary | ICD-10-CM

## 2017-12-24 DIAGNOSIS — Z9989 Dependence on other enabling machines and devices: Secondary | ICD-10-CM

## 2017-12-25 MED ORDER — AMPHETAMINE-DEXTROAMPHETAMINE 10 MG PO TABS: 10.0000 mg | ORAL_TABLET | Freq: Two times a day (BID) | ORAL | 0 refills | Status: DC | PRN

## 2018-01-07 ENCOUNTER — Encounter: Payer: Self-pay | Admitting: Neurology

## 2018-01-11 ENCOUNTER — Ambulatory Visit (INDEPENDENT_AMBULATORY_CARE_PROVIDER_SITE_OTHER): Payer: BLUE CROSS/BLUE SHIELD | Admitting: Neurology

## 2018-01-11 ENCOUNTER — Encounter: Payer: Self-pay | Admitting: Neurology

## 2018-01-11 VITALS — BP 106/76 | HR 71 | Ht 65.0 in | Wt 192.5 lb

## 2018-01-11 DIAGNOSIS — J3089 Other allergic rhinitis: Secondary | ICD-10-CM | POA: Diagnosis not present

## 2018-01-11 DIAGNOSIS — G4710 Hypersomnia, unspecified: Secondary | ICD-10-CM | POA: Diagnosis not present

## 2018-01-11 DIAGNOSIS — G4730 Sleep apnea, unspecified: Secondary | ICD-10-CM

## 2018-01-11 DIAGNOSIS — Z9989 Dependence on other enabling machines and devices: Secondary | ICD-10-CM | POA: Diagnosis not present

## 2018-01-11 DIAGNOSIS — G4733 Obstructive sleep apnea (adult) (pediatric): Secondary | ICD-10-CM | POA: Diagnosis not present

## 2018-01-11 DIAGNOSIS — J3081 Allergic rhinitis due to animal (cat) (dog) hair and dander: Secondary | ICD-10-CM | POA: Diagnosis not present

## 2018-01-11 DIAGNOSIS — J301 Allergic rhinitis due to pollen: Secondary | ICD-10-CM | POA: Diagnosis not present

## 2018-01-11 NOTE — Patient Instructions (Signed)
Please continue using your CPAP regularly. While your insurance requires that you use CPAP at least 4 hours each night on 70% of the nights, I recommend, that you not skip any nights and use it throughout the night if you can. Getting used to CPAP and staying with the treatment long term does take time and patience and discipline. Untreated obstructive sleep apnea when it is moderate to severe can have an adverse impact on cardiovascular health and raise her risk for heart disease, arrhythmias, hypertension, congestive heart failure, stroke and diabetes. Untreated obstructive sleep apnea causes sleep disruption, nonrestorative sleep, and sleep deprivation. This can have an impact on your day to day functioning and cause daytime sleepiness and impairment of cognitive function, memory loss, mood disturbance, and problems focussing. Using CPAP regularly can improve these symptoms.  We will continue the adderall low dose.   Follow up in 6 months.   Keep up the good work!

## 2018-01-11 NOTE — Progress Notes (Signed)
Subjective:    Patient ID: Alicia [name] is a 52 y.o. female.  HPI     Interim history:   Alicia [name] is a very pleasant 52 year old right-handed woman with an underlying medical history of allergies, hyperlipidemia, and obstructive sleep apnea on [name] with residual daytime somnolence, who presents for follow-up consultation of Alicia [name] sleep apnea and hypersomnolence. The patient is unaccompanied today. Alicia [name] last saw Alicia [name] on 07/13/2017, at which time Alicia [name] was compliant with [name]. Alicia [name] had issues with neck pain and went through physical therapy with dry needling. Alicia [name] was supposed to see orthopedics Alicia [name] was doing reasonably well on Alicia [name] and was compliant with [name] therapy. Alicia [name] suggested a six-month follow-up.   Today, 01/11/2018: Alicia [name] reviewed Alicia [name] compliance data from 12/09/2017 through 01/07/2018 which is a total of 30 days, during which time Alicia [name] used Alicia [name] every night with percent used days greater than 4 hours at 100%, indicating superb compliance with an average usage of 7 hours and 14 minutes, residual AHI borderline at 5.9 per hour, leaked low with the 95th percentile at 4.4 L/m on a pressure of 10 cm with EPR of 2. Alicia [name] reports doing well, no new concerns, meds working fairly well. Alicia [name] is somewhat sensitive to the [name], avoids taking a second dose some days, sometimes just takes a half pill for Alicia [name] second dose. Neck pain is better after dry needling and injection under neurosurgery, went to physical therapy for the dry needling.  The patient's allergies, current medications, family history, past medical history, past social history, past surgical history and problem list were reviewed and updated as appropriate.    Previously (copied from previous notes for reference):   Alicia [name] saw Alicia [name] on 01/05/2017, at which time Alicia [name] was compliant with Alicia [name]. Alicia [name] had occasional difficulty maintaining sleep at night, was attributing this to taking the [name]. Alicia [name] was trying to be careful not to  take it too late after lunch. Alicia [name] suggested we continue [name] immediate release 10 mg twice a day.    Alicia [name] reviewed Alicia [name] compliance data from 06/11/2017 through 07/10/2017 which is a total of 30 days, during which time Alicia [name] used Alicia [name] every night with percent used days greater than 4 hours at 100%, indicating superb compliance with an average usage of 7 hours and 11 minutes, residual AHI borderline at 6.4 per hour, leak acceptable with the 95th percentile at 12.6 L/m on a pressure of 10 cm with EPR of 2.    Alicia [name] reviewed Alicia [name] compliance data from 12/06/2016 through 01/04/2017 which is a total of 30 days, during which time Alicia [name] used Alicia [name] every night with percent used days greater than 4 hours at 100%, indicating superb compliance with an average usage of 6 hours and 29 minutes, residual AHI at goal at 3.2 per hour, leak low with the 95th percentile at 3.1 L/m on a pressure of 10 cm with EPR of 2.     Alicia [name] saw Alicia [name] on 07/07/2016 after a gap of nearly 2 years, at which time Alicia [name] reported doing okay, weight had been fluctuating, but was going to the gym on a fairly regular basis. Alicia [name] was fully compliant with [name], still had sleepiness during the day especially in the afternoons, particularly after eating a carb-laden meal. Other than that, Alicia [name] was stable, taking a vitamin D supplement and other supplements, vitamin D was low per Alicia [name] report and Alicia [name] was told to start taking over-the-counter vit D per PCP. Alicia [name] had not tried  the [name] that Alicia [name] suggested at the last visit and had stopped taking the Nuvigil even though it worked okay because of was too expensive. Alicia [name] suggested starting Alicia [name] on [name] immediate release 10 mg once daily in the early afternoon. Alicia [name] emailed in the interim in November 2017, reporting being a little bit more focused during the day and not overeating as much but as far as daytime somnolence Alicia [name] did not notice any telltale was palms. Alicia [name] suggested we increase the [name] to 10 mg twice daily,  second dose no later than 3 PM to avoid nighttime insomnia.   Alicia [name] saw Alicia [name] on 08/21/2014, at which time Alicia [name] reported doing well on Nuvigil 150 mg once daily and Alicia [name] felt that it was better than the Provigil generic. Alicia [name] had some residual sleepiness but felt improved overall. Alicia [name] was compliant with [name] therapy. Alicia [name] suggested we try low-dose [name] as needed for residual sleepiness.   Alicia [name] reviewed Alicia [name] compliance data from 06/07/2016 through 07/06/2016 which is a total of 30 days, during which time Alicia [name] used Alicia [name] machine every night with percent used days greater than 4 hours at 100%, indicating superb compliance with an average usage of 7 hours and 4 minutes, residual AHI 4.1 per hour, leak low for the 95th percentile at 2.6 L/m on a pressure of 10 cm with EPR of 2.   Alicia [name] saw Alicia [name] on 02/13/2014, at which time Alicia [name] continued to endorse daytime somnolence. Alicia [name] had just been approved for Nuvigil but had not started it yet. Alicia [name] was fully compliant with [name] therapy.   Alicia [name] reviewed Alicia [name] compliance data from the compliance card on Alicia [name] machine: 07/22/2014 through 08/20/2014 which is a total of 30 days during which time Alicia [name] used Alicia [name] machine every night with percent used days greater than 4 hours of 97%, indicating excellent compliance, pressure still at 10 cm with EPR of 2. Residual AHI acceptable at 4.9 per hour and leak generally low with the 95th percentile of leak at 6.2 L/m, average usage of 7 hours and 19 minutes. Alicia [name] is compliant with Alicia [name] machine, Alicia [name] has a [employer]P Pill[employer] nasal pillows. Alicia [name] has exacerbation of Alicia [name] allergies and also facial eczema. Alicia [name] has recently seen a dermatologist and was given a new ointment.   Alicia [name] saw Alicia [name] on 08/15/2013, at which time we talked about Alicia [name] sleep test results from 2013 and Alicia [name] compliance which was excellent. Alicia [name] has a family history of narcolepsy with cataplexy in Alicia [name] father. Alicia [name] felt Provigil as needed was helpful. Alicia [name] had trouble remembering the second dose of Provigil and  therefore Alicia [name] suggested a trial of Nuvigil once daily. Alicia [name] was approved for this.    Alicia [name] first met Alicia [name] on 05/23/2013, at which time Alicia [name] reported being veyr sleepy despite being compliant with [name] therapy. Alicia [name] suggested that Alicia [name] try Provigil to help stay awake during the day a little better, starting with 200 mg strength half a pill up to twice daily as needed. Alicia [name] advised Alicia [name] not to take it after 3 PM to avoid insomnia at night.  Alicia [name] has a FHx of narcolepsy with cataplexy in Alicia [name] father and was particularly concerned that Alicia [name] may have narcolepsy.    Alicia [name] had reviewed records including a recent [name] compliance download from 04/02/2013 through 05/22/2013, total of 51 days, during which time Alicia [name] used [name] every day. Percent used days greater than 4 hours was 96% indicating excellent compliance. Alicia [name] pressure is 10 cm water pressure with an EPR of  2. Residual AHI is 4.7 indicating an appropriate pressure level. Average usage was 6:41 hours.    Alicia [name] reviewed a sleep study report from 11/10/2011. This was a split-night study. REM percentage was 17% and the baseline portion of the study. Alicia [name] AHI was 61.9 per hour with an oxyhemoglobin desaturation nadir of 75%. Alicia [name] was titrated on [name] from 4-10 cm of water pressure with elimination of Alicia [name] sleep disordered breathing reported.    Alicia [name] uses a nasal pillows mask. Alicia [name] also reviewed older compliance data from August to Nov. 2013, during which time Alicia [name] was very compliant but had a high leak. At the time, Alicia [name] was using a FFM and leak was much better with the new mask.   Alicia [name] Past Medical History Is Significant For: Past Medical History:  Diagnosis Date  . Allergy   . Hypersomnia with sleep apnea, unspecified 05/23/2013  . OSA on [name] 08/15/2013    Alicia [name] Past Surgical History Is Significant For: Past Surgical History:  Procedure Laterality Date  . HYSTEROSCOPY W/D&C    . WISDOM TOOTH EXTRACTION      Alicia [name] Family History Is Significant For: Family History  Problem Relation  Age of Onset  . Diabetes Father        type 2  . Depression Father   . Dementia Father   . Narcolepsy Father        cataplexy  . High Cholesterol Father   . Stroke Maternal Grandmother   . Heart disease Maternal Grandfather     Alicia [name] Social History Is Significant For: Social History   Socioeconomic History  . Marital status: Single    Spouse name: Not on file  . Number of children: 0  . Years of education: Post Grad  . Highest education level: Not on file  Occupational History    Comment: [employer]  . Financial resource strain: Not on file  . Food insecurity:    Worry: Not on file    Inability: Not on file  . Transportation needs:    Medical: Not on file    Non-medical: Not on file  Tobacco Use  . Smoking status: Never Smoker  . Smokeless tobacco: Never Used  Substance and Sexual Activity  . Alcohol use: No  . Drug use: No  . Sexual activity: Never  Lifestyle  . Physical activity:    Days per week: Not on file    Minutes per session: Not on file  . Stress: Not on file  Relationships  . Social connections:    Talks on phone: Not on file    Gets together: Not on file    Attends religious service: Not on file    Active member of club or organization: Not on file    Attends meetings of clubs or organizations: Not on file    Relationship status: Not on file  Other Topics Concern  . Not on file  Social History Narrative   Patient lives at home alone.   Caffeine Use: occasionally soda    Alicia [name] Allergies Are:  Allergies  Allergen Reactions  . Latex   . Other     Seasonal   . Lubricants   :   Alicia [name] Current Medications Are:  Outpatient Encounter Medications as of 01/11/2018  Medication Sig  . amphetamine-dextroamphetamine ([name]) 10 MG tablet Take 1 tablet (10 mg total) by mouth 2 (two) times daily as needed.  Marland Kitchen b complex vitamins capsule Take 1 capsule by mouth daily.  . Bepotastine  Besilate (BEPREVE) 1.5 % SOLN Place 1 drop into both  eyes once.  . betamethasone valerate lotion (VALISONE) 0.1 % Apply 1 application topically daily.  Marland Kitchen desonide (DESOWEN) 0.05 % lotion Apply 1 application topically 2 (two) times daily.  . fexofenadine (ALLEGRA) 180 MG tablet Take 180 mg by mouth daily.  Marland Kitchen gabapentin (NEURONTIN) 100 MG capsule Take 2 capsules (200 mg total) by mouth at bedtime.  . Multiple Vitamin (MULTIVITAMIN) tablet Take 1 tablet by mouth daily.  . pimecrolimus (ELIDEL) 1 % cream Apply 1 application topically 2 (two) times daily.  . Triamcinolone Acetonide (NASACORT ALLERGY 24HR) 55 MCG/ACT AERO Place 2 sprays into the nose 2 (two) times daily.  Marland Kitchen venlafaxine XR (EFFEXOR XR) 37.5 MG 24 hr capsule Take 1 capsule (37.5 mg total) by mouth daily with breakfast.  . [DISCONTINUED] Vitamin D, Ergocalciferol, (DRISDOL) 50000 units CAPS capsule Take 1 capsule (50,000 Units total) by mouth every 7 (seven) days.   No facility-administered encounter medications on file as of 01/11/2018.   :  Review of Systems:  Out of a complete 14 point review of systems, all are reviewed and negative with the exception of these symptoms as listed below:  Review of Systems  Neurological:       Patient reports that Alicia [name] is doing well with Alicia [name] machine.     Objective:  Neurological Exam  Physical Exam Physical Examination:   Vitals:   01/11/18 1029  BP: 106/76  Pulse: 71   General Examination: The patient is a very pleasant 52 y.o. female in no acute distress. Alicia [name] appears well-developed and well-nourished and well groomed.   HEENT:Normocephalic, atraumatic, pupils are equal, round and reactive to light and accommodation. Extraocular tracking is good without limitation to gaze excursion or nystagmus noted. Normal smooth pursuit is noted. Hearing is grossly intact. Face is symmetric with normal facial animation and normal facial sensation. Speech is clear with no dysarthria noted. There is no hypophonia. There is no lip, neck/head, jaw or voice  tremor. Neck with some discomfort, oropharynx exam reveals: no obvious change.   Chest:Clear to auscultation without wheezing, rhonchi or crackles noted.  Heart:S1+S2+0, regular and normal without murmurs, rubs or gallops noted.   Abdomen:Soft, non-tender and non-distended with normal bowel sounds appreciated on auscultation.  Extremities:There is noobvious edema in the distal lower extremities bilaterally.   Skin: Warm and dry without trophic changes noted.  Musculoskeletal: exam reveals no obvious joint deformities, tenderness or joint swelling or erythema.   Neurologically:  Mental status: The patient is awake, alert and oriented in all 4 spheres. Herimmediate and remote memory, attention, language skills and fund of knowledge are appropriate. There is no evidence of aphasia, agnosia, apraxia or anomia. Speech is clear with normal prosody and enunciation. Thought process is linear. Mood is normaland affect is normal.  Cranial nerves II - XII are as described above under HEENT exam. In addition: shoulder shrug is normal with equal shoulder height noted. Motor exam: Normal bulk, strength and tone is noted. There is no drift, tremor or rebound. Fine motor skills and coordination: intact.  Cerebellar testing: No dysmetria or intention tremor. There is no truncal or gait ataxia.  Sensory exam: intact to light touch in the upper and lower extremities.  Gait, station and balance: Shestands easily. No veering to one side is noted. No leaning to one side is noted. Posture is age-appropriate and stance is narrow based. Gait shows normalstride length and normalpace. No problems turning are noted.   Assessment  and Plan:  In summary, Anaira Kilimanjarois a very pleasant 52 year old femalewith an underlying medical history of allergies and obesity, who returns for follow-up consultation of Alicia [name] OSA, well established on [name] therapy with history of hypersomnolence associated with Alicia [name]  sleep apnea. Alicia [name] has a family history of narcolepsy. Alicia [name] has also a strong family history of OSA. Alicia [name] is on low-dose [name] immediate release up to twice daily but often only uses it once daily or a smaller second dose. Alicia [name] is up-to-date with Alicia [name] prescription, Alicia [name] is fully compliant with [name] and commended for this. Alicia [name] exam is stable. Alicia [name] neck pain has improved after physical therapy and dry needling and injection under neurosurgery. Alicia [name] is advised to follow-up routinely in 6 months from my end of things, sooner if needed. Alicia [name] answered all their questions today and the patient and Alicia [name] sister were in agreement. Alicia [name] spent 20 minutes in total face-to-face time with the patient, more than 50% of which was spent in counseling and coordination of care, reviewing test results, reviewing medication and discussing or reviewing the diagnosis of OSA and Hypersomnolence, the prognosis and treatment options. Pertinent laboratory and imaging test results that were available during this visit with the patient were reviewed by me and considered in my medical decision making (see chart for details).

## 2018-01-29 DIAGNOSIS — J3081 Allergic rhinitis due to animal (cat) (dog) hair and dander: Secondary | ICD-10-CM | POA: Diagnosis not present

## 2018-01-29 DIAGNOSIS — J3089 Other allergic rhinitis: Secondary | ICD-10-CM | POA: Diagnosis not present

## 2018-01-29 DIAGNOSIS — J301 Allergic rhinitis due to pollen: Secondary | ICD-10-CM | POA: Diagnosis not present

## 2018-02-02 DIAGNOSIS — J3081 Allergic rhinitis due to animal (cat) (dog) hair and dander: Secondary | ICD-10-CM | POA: Diagnosis not present

## 2018-02-02 DIAGNOSIS — J301 Allergic rhinitis due to pollen: Secondary | ICD-10-CM | POA: Diagnosis not present

## 2018-02-02 DIAGNOSIS — J3089 Other allergic rhinitis: Secondary | ICD-10-CM | POA: Diagnosis not present

## 2018-02-06 DIAGNOSIS — J301 Allergic rhinitis due to pollen: Secondary | ICD-10-CM | POA: Diagnosis not present

## 2018-02-06 DIAGNOSIS — J3081 Allergic rhinitis due to animal (cat) (dog) hair and dander: Secondary | ICD-10-CM | POA: Diagnosis not present

## 2018-02-06 DIAGNOSIS — J3089 Other allergic rhinitis: Secondary | ICD-10-CM | POA: Diagnosis not present

## 2018-02-14 DIAGNOSIS — J301 Allergic rhinitis due to pollen: Secondary | ICD-10-CM | POA: Diagnosis not present

## 2018-02-14 DIAGNOSIS — J3089 Other allergic rhinitis: Secondary | ICD-10-CM | POA: Diagnosis not present

## 2018-02-18 NOTE — Progress Notes (Deleted)
[name] [address] [place], [place] [address] Phone: [phone] Subjective:     CC: Neck pain and back pain follow-up  [id number]  Alicia [name] is a 52 y.o. female coming in with complaint of neck pain and back pain.  Patient for the neck found to have a chronic C6 radicular symptoms.  Patient did have moderate foraminal encroachment at C5-C6 on MRI.  Patient did respond fairly well to epidural October 16, 2017.  Has been doing home exercises as well as some medications.  Patient states  Also was having low back pain.  Sent for x-rays showed severe osteoarthritic changes at L5-S1.  Patient states     Past Medical History:  Diagnosis Date  . Allergy   . Hypersomnia with sleep apnea, unspecified 05/23/2013  . OSA on CPAP 08/15/2013   Past Surgical History:  Procedure Laterality Date  . HYSTEROSCOPY W/D&C    . WISDOM TOOTH EXTRACTION     Social History   Socioeconomic History  . Marital status: Single    Spouse name: Not on file  . Number of children: 0  . Years of education: Post Grad  . Highest education level: Not on file  Occupational History    Comment: [employer]  Social Needs  . Financial resource strain: Not on file  . Food insecurity:    Worry: Not on file    Inability: Not on file  . Transportation needs:    Medical: Not on file    Non-medical: Not on file  Tobacco Use  . Smoking status: Never Smoker  . Smokeless tobacco: Never Used  Substance and Sexual Activity  . Alcohol use: No  . Drug use: No  . Sexual activity: Never  Lifestyle  . Physical activity:    Days per week: Not on file    Minutes per session: Not on file  . Stress: Not on file  Relationships  . Social connections:    Talks on phone: Not on file    Gets together: Not on file    Attends religious service: Not on file    Active member of club or organization: Not on file    Attends meetings of clubs or organizations: Not on file     Relationship status: Not on file  Other Topics Concern  . Not on file  Social History Narrative   Patient lives at home alone.   Caffeine Use: occasionally soda   Allergies  Allergen Reactions  . Latex   . Other     Seasonal   . Lubricants    Family History  Problem Relation Age of Onset  . Diabetes Father        type 2  . Depression Father   . Dementia Father   . Narcolepsy Father        cataplexy  . High Cholesterol Father   . Stroke Maternal Grandmother   . Heart disease Maternal Grandfather      Past medical history, social, surgical and family history all reviewed in electronic medical record.  No pertanent information unless stated regarding to the chief complaint.   Review of Systems:Review of systems updated and as accurate as of 02/18/18  No headache, visual changes, nausea, vomiting, diarrhea, constipation, dizziness, abdominal pain, skin rash, fevers, chills, night sweats, weight loss, swollen lymph nodes, body aches, joint swelling, muscle aches, chest pain, shortness of breath, mood changes.   Objective  There were no vitals taken for this visit. Systems  examined below as of 02/18/18   General: No apparent distress alert and oriented x3 mood and affect normal, dressed appropriately.  HEENT: Pupils equal, extraocular movements intact  Respiratory: Patient's speak in full sentences and does not appear short of breath  Cardiovascular: No lower extremity edema, non tender, no erythema  Skin: Warm dry intact with no signs of infection or rash on extremities or on axial skeleton.  Abdomen: Soft nontender  Neuro: Cranial nerves II through XII are intact, neurovascularly intact in all extremities with 2+ DTRs and 2+ pulses.  Lymph: No lymphadenopathy of posterior or anterior cervical chain or axillae bilaterally.  Gait normal with good balance and coordination.  MSK:  Non tender with full range of motion and good stability and symmetric strength and tone of  shoulders, elbows, wrist, hip, knee and ankles bilaterally.     Impression and Recommendations:     This case required medical decision making of moderate complexity.      Note: This dictation was prepared with Dragon dictation along with smaller phrase technology. Any transcriptional errors that result from this process are unintentional.

## 2018-02-19 ENCOUNTER — Ambulatory Visit: Payer: BLUE CROSS/BLUE SHIELD | Admitting: Family Medicine

## 2018-02-19 DIAGNOSIS — Z0289 Encounter for other administrative examinations: Secondary | ICD-10-CM

## 2018-02-20 DIAGNOSIS — J3081 Allergic rhinitis due to animal (cat) (dog) hair and dander: Secondary | ICD-10-CM | POA: Diagnosis not present

## 2018-02-20 DIAGNOSIS — J3089 Other allergic rhinitis: Secondary | ICD-10-CM | POA: Diagnosis not present

## 2018-02-20 DIAGNOSIS — J301 Allergic rhinitis due to pollen: Secondary | ICD-10-CM | POA: Diagnosis not present

## 2018-03-01 DIAGNOSIS — Z01411 Encounter for gynecological examination (general) (routine) with abnormal findings: Secondary | ICD-10-CM | POA: Diagnosis not present

## 2018-03-01 DIAGNOSIS — N6313 Unspecified lump in the right breast, lower outer quadrant: Secondary | ICD-10-CM | POA: Diagnosis not present

## 2018-03-01 DIAGNOSIS — Z124 Encounter for screening for malignant neoplasm of cervix: Secondary | ICD-10-CM | POA: Diagnosis not present

## 2018-03-01 DIAGNOSIS — N6011 Diffuse cystic mastopathy of right breast: Secondary | ICD-10-CM | POA: Diagnosis not present

## 2018-03-01 DIAGNOSIS — N898 Other specified noninflammatory disorders of vagina: Secondary | ICD-10-CM | POA: Diagnosis not present

## 2018-03-01 DIAGNOSIS — Z6832 Body mass index (BMI) 32.0-32.9, adult: Secondary | ICD-10-CM | POA: Diagnosis not present

## 2018-03-05 DIAGNOSIS — J301 Allergic rhinitis due to pollen: Secondary | ICD-10-CM | POA: Diagnosis not present

## 2018-03-05 DIAGNOSIS — J3081 Allergic rhinitis due to animal (cat) (dog) hair and dander: Secondary | ICD-10-CM | POA: Diagnosis not present

## 2018-03-05 DIAGNOSIS — J3089 Other allergic rhinitis: Secondary | ICD-10-CM | POA: Diagnosis not present

## 2018-03-22 NOTE — Progress Notes (Signed)
[name] [address] [place], [place] [address] Phone: [phone] Subjective:       CC: Neck pain follow-up  [id number]  Alicia [name] is a 52 y.o. female coming in with complaint of neck pain follow-up.  Has been seen previously for neck arthritis.  Has had radicular symptoms.  Patient has also had carpal tunnel syndrome.  Has responded well to an epidural back in December.  Patient states that dyspnea worsening pain again.  Started having increasing neck pain, headache, not affecting daily activities.  More radiation into the hand as well.    Past Medical History:  Diagnosis Date  . Allergy   . Hypersomnia with sleep apnea, unspecified 05/23/2013  . OSA on CPAP 08/15/2013   Past Surgical History:  Procedure Laterality Date  . HYSTEROSCOPY W/D&C    . WISDOM TOOTH EXTRACTION     Social History   Socioeconomic History  . Marital status: Single    Spouse name: Not on file  . Number of children: 0  . Years of education: Post Grad  . Highest education level: Not on file  Occupational History    Comment: [employer]  Social Needs  . Financial resource strain: Not on file  . Food insecurity:    Worry: Not on file    Inability: Not on file  . Transportation needs:    Medical: Not on file    Non-medical: Not on file  Tobacco Use  . Smoking status: Never Smoker  . Smokeless tobacco: Never Used  Substance and Sexual Activity  . Alcohol use: No  . Drug use: No  . Sexual activity: Never  Lifestyle  . Physical activity:    Days per week: Not on file    Minutes per session: Not on file  . Stress: Not on file  Relationships  . Social connections:    Talks on phone: Not on file    Gets together: Not on file    Attends religious service: Not on file    Active member of club or organization: Not on file    Attends meetings of clubs or organizations: Not on file    Relationship status: Not on file  Other Topics Concern    . Not on file  Social History Narrative   Patient lives at home alone.   Caffeine Use: occasionally soda   Allergies  Allergen Reactions  . Latex   . Other     Seasonal   . Lubricants    Family History  Problem Relation Age of Onset  . Diabetes Father        type 2  . Depression Father   . Dementia Father   . Narcolepsy Father        cataplexy  . High Cholesterol Father   . Stroke Maternal Grandmother   . Heart disease Maternal Grandfather      Past medical history, social, surgical and family history all reviewed in electronic medical record.  No pertanent information unless stated regarding to the chief complaint.   Review of Systems:Review of systems updated and as accurate as of 03/22/18  No headache, visual changes, nausea, vomiting, diarrhea, constipation, dizziness, abdominal pain, skin rash, fevers, chills, night sweats, weight loss, swollen lymph nodes, body aches, joint swelling, muscle aches, chest pain, shortness of breath, mood changes.   Objective  There were no vitals taken for this visit. Systems examined below as of 03/22/18   General: No apparent distress alert and oriented  x3 mood and affect normal, dressed appropriately.  HEENT: Pupils equal, extraocular movements intact  Respiratory: Patient's speak in full sentences and does not appear short of breath  Cardiovascular: No lower extremity edema, non tender, no erythema  Skin: Warm dry intact with no signs of infection or rash on extremities or on axial skeleton.  Abdomen: Soft nontender  Neuro: Cranial nerves II through XII are intact, neurovascularly intact in all extremities with 2+ DTRs and 2+ pulses.  Lymph: No lymphadenopathy of posterior or anterior cervical chain or axillae bilaterally.  Gait normal with good balance and coordination.  MSK:  Non tender with full range of motion and good stability and symmetric strength and tone of  elbows, wrist, hip, knee and ankles bilaterally.   Neck: Inspection loss of lordosis. No palpable stepoffs. Negative Spurling's maneuver. Patient does have some limited side bending bilaterally as well as some loss of range of motion with rotation to the right. Grip strength and sensation normal in bilateral hands Strength good C4 to T1 distribution No sensory change to C4 to T1 Negative Hoffman sign bilaterally Reflexes normal Right trapezius tightness noted and significant number trigger points noted on the right and left side.  Consent patient was prepped with alcohol swab and with a 25-gauge needle was injected into 4 distinct trigger points within the shoulder region.  Total of 3 cc of 0.5% Marcaine and 1 cc of Kenalog 40 mg/mL used.  No blood loss.  Postinjection instructions given   Impression and Recommendations:     This case required medical decision making of moderate complexity.      Note: This dictation was prepared with Dragon dictation along with smaller phrase technology. Any transcriptional errors that result from this process are unintentional.

## 2018-03-26 ENCOUNTER — Encounter: Payer: Self-pay | Admitting: Family Medicine

## 2018-03-26 ENCOUNTER — Ambulatory Visit (INDEPENDENT_AMBULATORY_CARE_PROVIDER_SITE_OTHER): Payer: BLUE CROSS/BLUE SHIELD | Admitting: Family Medicine

## 2018-03-26 DIAGNOSIS — M25512 Pain in left shoulder: Secondary | ICD-10-CM

## 2018-03-26 DIAGNOSIS — M5030 Other cervical disc degeneration, unspecified cervical region: Secondary | ICD-10-CM

## 2018-03-26 NOTE — Assessment & Plan Note (Signed)
Injected bilaterally.  Trigger point injections given.  Tolerated the procedure well.  Discussed continuing the gabapentin and the Effexor.  Discussed icing regimen.  Discussed if no improvement in an epidural in the neck needs to be done again.  Patient is in agreement with the plan.

## 2018-03-26 NOTE — Patient Instructions (Signed)
Good to see you  Alicia [name] is your friend.  Stay active Lets see how trigger point injections do.  If not a lot better in 2 weeks send a message and I will order another epidural in the neck  IF we do the epidural then I want to see you again 2-3 weeks AFTER the injection  Otherwise see me again in 2-3 months

## 2018-03-26 NOTE — Assessment & Plan Note (Signed)
Moderate to severe with worsening symptoms.  May need another epidural.  Discussed icing regimen and home exercises.  Continue with the Effexor.  May need to increase dose.  Patient also ordered another epidural at this point.  Follow-up again in 4 weeks after the epidural

## 2018-04-03 DIAGNOSIS — J3089 Other allergic rhinitis: Secondary | ICD-10-CM | POA: Diagnosis not present

## 2018-04-03 DIAGNOSIS — J3081 Allergic rhinitis due to animal (cat) (dog) hair and dander: Secondary | ICD-10-CM | POA: Diagnosis not present

## 2018-04-03 DIAGNOSIS — J301 Allergic rhinitis due to pollen: Secondary | ICD-10-CM | POA: Diagnosis not present

## 2018-04-05 DIAGNOSIS — J301 Allergic rhinitis due to pollen: Secondary | ICD-10-CM | POA: Diagnosis not present

## 2018-04-05 DIAGNOSIS — J3089 Other allergic rhinitis: Secondary | ICD-10-CM | POA: Diagnosis not present

## 2018-04-05 DIAGNOSIS — J3081 Allergic rhinitis due to animal (cat) (dog) hair and dander: Secondary | ICD-10-CM | POA: Diagnosis not present

## 2018-04-06 ENCOUNTER — Telehealth: Payer: Self-pay | Admitting: Neurology

## 2018-04-06 ENCOUNTER — Other Ambulatory Visit: Payer: Self-pay | Admitting: Neurology

## 2018-04-06 DIAGNOSIS — G4730 Sleep apnea, unspecified: Secondary | ICD-10-CM

## 2018-04-06 DIAGNOSIS — G4733 Obstructive sleep apnea (adult) (pediatric): Secondary | ICD-10-CM

## 2018-04-06 DIAGNOSIS — Z9989 Dependence on other enabling machines and devices: Secondary | ICD-10-CM

## 2018-04-06 DIAGNOSIS — G4710 Hypersomnia, unspecified: Secondary | ICD-10-CM

## 2018-04-06 MED ORDER — AMPHETAMINE-DEXTROAMPHETAMINE 10 MG PO TABS: 10.0000 mg | ORAL_TABLET | Freq: Two times a day (BID) | ORAL | 0 refills | Status: DC | PRN

## 2018-04-06 NOTE — Telephone Encounter (Signed)
I called pt, advised her that the request has been sent to Dr. Frances FurbishAthar. I encouraged pt to [facility] then end of today, since the adderall RXs are now able to be sent electronically. Pt verbalized understanding.

## 2018-04-06 NOTE — Telephone Encounter (Signed)
Pt just wanted to make sure her request for a refill on her amphetamine-dextroamphetamine (ADDERALL) 10 MG tablet has been sent to  [id number] [place], [place] - [address] [phone] (Phone) [phone] (Fax)

## 2018-04-06 NOTE — Telephone Encounter (Signed)
Received adderall refill request was received today. I have sent the request to Dr. Frances FurbishAthar.

## 2018-04-13 ENCOUNTER — Telehealth: Payer: Self-pay | Admitting: Neurology

## 2018-04-13 DIAGNOSIS — G4710 Hypersomnia, unspecified: Secondary | ICD-10-CM

## 2018-04-13 DIAGNOSIS — J301 Allergic rhinitis due to pollen: Secondary | ICD-10-CM | POA: Diagnosis not present

## 2018-04-13 DIAGNOSIS — G4733 Obstructive sleep apnea (adult) (pediatric): Secondary | ICD-10-CM

## 2018-04-13 DIAGNOSIS — G4730 Sleep apnea, unspecified: Secondary | ICD-10-CM

## 2018-04-13 DIAGNOSIS — J3081 Allergic rhinitis due to animal (cat) (dog) hair and dander: Secondary | ICD-10-CM | POA: Diagnosis not present

## 2018-04-13 DIAGNOSIS — Z9989 Dependence on other enabling machines and devices: Secondary | ICD-10-CM

## 2018-04-13 DIAGNOSIS — J3089 Other allergic rhinitis: Secondary | ICD-10-CM | POA: Diagnosis not present

## 2018-04-13 MED ORDER — AMPHETAMINE-DEXTROAMPHETAMINE 20 MG PO TABS: 10.0000 mg | ORAL_TABLET | Freq: Two times a day (BID) | ORAL | 0 refills | Status: DC | PRN

## 2018-04-13 NOTE — Telephone Encounter (Signed)
Patient called on-call physician, she has run out of her Adderall supply, going through her insurance would cause her more than $400 each month,  I have talked with patient, called in Adderall 20 mg tablets, half tablet as needed twice a day, [facility], with good Rx coupon, it will only cost $17.

## 2018-04-25 DIAGNOSIS — J3089 Other allergic rhinitis: Secondary | ICD-10-CM | POA: Diagnosis not present

## 2018-04-25 DIAGNOSIS — J3081 Allergic rhinitis due to animal (cat) (dog) hair and dander: Secondary | ICD-10-CM | POA: Diagnosis not present

## 2018-04-25 DIAGNOSIS — J301 Allergic rhinitis due to pollen: Secondary | ICD-10-CM | POA: Diagnosis not present

## 2018-05-01 DIAGNOSIS — J3089 Other allergic rhinitis: Secondary | ICD-10-CM | POA: Diagnosis not present

## 2018-05-01 DIAGNOSIS — J301 Allergic rhinitis due to pollen: Secondary | ICD-10-CM | POA: Diagnosis not present

## 2018-05-01 DIAGNOSIS — J3081 Allergic rhinitis due to animal (cat) (dog) hair and dander: Secondary | ICD-10-CM | POA: Diagnosis not present

## 2018-05-03 DIAGNOSIS — G4733 Obstructive sleep apnea (adult) (pediatric): Secondary | ICD-10-CM | POA: Diagnosis not present

## 2018-05-08 DIAGNOSIS — J3089 Other allergic rhinitis: Secondary | ICD-10-CM | POA: Diagnosis not present

## 2018-05-08 DIAGNOSIS — J3081 Allergic rhinitis due to animal (cat) (dog) hair and dander: Secondary | ICD-10-CM | POA: Diagnosis not present

## 2018-05-08 DIAGNOSIS — J301 Allergic rhinitis due to pollen: Secondary | ICD-10-CM | POA: Diagnosis not present

## 2018-05-11 DIAGNOSIS — J3089 Other allergic rhinitis: Secondary | ICD-10-CM | POA: Diagnosis not present

## 2018-05-11 DIAGNOSIS — J301 Allergic rhinitis due to pollen: Secondary | ICD-10-CM | POA: Diagnosis not present

## 2018-05-11 DIAGNOSIS — J3081 Allergic rhinitis due to animal (cat) (dog) hair and dander: Secondary | ICD-10-CM | POA: Diagnosis not present

## 2018-05-15 DIAGNOSIS — J3081 Allergic rhinitis due to animal (cat) (dog) hair and dander: Secondary | ICD-10-CM | POA: Diagnosis not present

## 2018-05-15 DIAGNOSIS — J3089 Other allergic rhinitis: Secondary | ICD-10-CM | POA: Diagnosis not present

## 2018-05-15 DIAGNOSIS — J301 Allergic rhinitis due to pollen: Secondary | ICD-10-CM | POA: Diagnosis not present

## 2018-05-21 DIAGNOSIS — J301 Allergic rhinitis due to pollen: Secondary | ICD-10-CM | POA: Diagnosis not present

## 2018-05-21 DIAGNOSIS — J3081 Allergic rhinitis due to animal (cat) (dog) hair and dander: Secondary | ICD-10-CM | POA: Diagnosis not present

## 2018-05-21 DIAGNOSIS — J3089 Other allergic rhinitis: Secondary | ICD-10-CM | POA: Diagnosis not present

## 2018-05-24 DIAGNOSIS — J3081 Allergic rhinitis due to animal (cat) (dog) hair and dander: Secondary | ICD-10-CM | POA: Diagnosis not present

## 2018-05-24 DIAGNOSIS — J3089 Other allergic rhinitis: Secondary | ICD-10-CM | POA: Diagnosis not present

## 2018-05-24 DIAGNOSIS — J301 Allergic rhinitis due to pollen: Secondary | ICD-10-CM | POA: Diagnosis not present

## 2018-05-29 DIAGNOSIS — J3081 Allergic rhinitis due to animal (cat) (dog) hair and dander: Secondary | ICD-10-CM | POA: Diagnosis not present

## 2018-05-29 DIAGNOSIS — J301 Allergic rhinitis due to pollen: Secondary | ICD-10-CM | POA: Diagnosis not present

## 2018-05-29 DIAGNOSIS — J3089 Other allergic rhinitis: Secondary | ICD-10-CM | POA: Diagnosis not present

## 2018-06-01 DIAGNOSIS — J3089 Other allergic rhinitis: Secondary | ICD-10-CM | POA: Diagnosis not present

## 2018-06-01 DIAGNOSIS — J301 Allergic rhinitis due to pollen: Secondary | ICD-10-CM | POA: Diagnosis not present

## 2018-06-01 DIAGNOSIS — J3081 Allergic rhinitis due to animal (cat) (dog) hair and dander: Secondary | ICD-10-CM | POA: Diagnosis not present

## 2018-06-05 DIAGNOSIS — J3081 Allergic rhinitis due to animal (cat) (dog) hair and dander: Secondary | ICD-10-CM | POA: Diagnosis not present

## 2018-06-05 DIAGNOSIS — J301 Allergic rhinitis due to pollen: Secondary | ICD-10-CM | POA: Diagnosis not present

## 2018-06-05 DIAGNOSIS — J3089 Other allergic rhinitis: Secondary | ICD-10-CM | POA: Diagnosis not present

## 2018-06-08 DIAGNOSIS — J3089 Other allergic rhinitis: Secondary | ICD-10-CM | POA: Diagnosis not present

## 2018-06-08 DIAGNOSIS — J301 Allergic rhinitis due to pollen: Secondary | ICD-10-CM | POA: Diagnosis not present

## 2018-06-08 DIAGNOSIS — J3081 Allergic rhinitis due to animal (cat) (dog) hair and dander: Secondary | ICD-10-CM | POA: Diagnosis not present

## 2018-06-13 DIAGNOSIS — J3081 Allergic rhinitis due to animal (cat) (dog) hair and dander: Secondary | ICD-10-CM | POA: Diagnosis not present

## 2018-06-13 DIAGNOSIS — J3089 Other allergic rhinitis: Secondary | ICD-10-CM | POA: Diagnosis not present

## 2018-06-13 DIAGNOSIS — J301 Allergic rhinitis due to pollen: Secondary | ICD-10-CM | POA: Diagnosis not present

## 2018-06-19 DIAGNOSIS — J3081 Allergic rhinitis due to animal (cat) (dog) hair and dander: Secondary | ICD-10-CM | POA: Diagnosis not present

## 2018-06-19 DIAGNOSIS — J301 Allergic rhinitis due to pollen: Secondary | ICD-10-CM | POA: Diagnosis not present

## 2018-06-19 DIAGNOSIS — J3089 Other allergic rhinitis: Secondary | ICD-10-CM | POA: Diagnosis not present

## 2018-06-21 ENCOUNTER — Ambulatory Visit: Payer: BLUE CROSS/BLUE SHIELD | Admitting: Family Medicine

## 2018-06-21 ENCOUNTER — Encounter: Payer: Self-pay | Admitting: Family Medicine

## 2018-06-21 ENCOUNTER — Ambulatory Visit (INDEPENDENT_AMBULATORY_CARE_PROVIDER_SITE_OTHER): Payer: BLUE CROSS/BLUE SHIELD | Admitting: Family Medicine

## 2018-06-21 VITALS — BP 118/84 | Ht 65.0 in | Wt 193.0 lb

## 2018-06-21 DIAGNOSIS — M5030 Other cervical disc degeneration, unspecified cervical region: Secondary | ICD-10-CM

## 2018-06-21 DIAGNOSIS — M5412 Radiculopathy, cervical region: Secondary | ICD-10-CM

## 2018-06-21 DIAGNOSIS — G5602 Carpal tunnel syndrome, left upper limb: Secondary | ICD-10-CM | POA: Diagnosis not present

## 2018-06-21 NOTE — Progress Notes (Signed)
Alicia [name] [address] [place], [place] [address] Phone: [phone] Subjective:     CC: Neck pain follow-up  [id number]  Alicia [name] is a 52 y.o. female coming in with complaint of neck pain. Patient has been swimming and does have pain in the left shoulder at times. Is going for dry needling next week which has helped alleviate her pain. Patient has been improving following the injection last visit. Does note triggering in her left scapula.  Patient has been going to physical therapy about once a month for dry needling as well.  Patient states that the pain is just seems to be slowly worsening over the course of time.     Past Medical History:  Diagnosis Date  . Allergy   . Hypersomnia with sleep apnea, unspecified 05/23/2013  . OSA on CPAP 08/15/2013   Past Surgical History:  Procedure Laterality Date  . HYSTEROSCOPY W/D&C    . WISDOM TOOTH EXTRACTION     Social History   Socioeconomic History  . Marital status: Single    Spouse name: Not on file  . Number of children: 0  . Years of education: Post Grad  . Highest education level: Not on file  Occupational History    Comment: [employer]  Social Needs  . Financial resource strain: Not on file  . Food insecurity:    Worry: Not on file    Inability: Not on file  . Transportation needs:    Medical: Not on file    Non-medical: Not on file  Tobacco Use  . Smoking status: Never Smoker  . Smokeless tobacco: Never Used  Substance and Sexual Activity  . Alcohol use: No  . Drug use: No  . Sexual activity: Never  Lifestyle  . Physical activity:    Days per week: Not on file    Minutes per session: Not on file  . Stress: Not on file  Relationships  . Social connections:    Talks on phone: Not on file    Gets together: Not on file    Attends religious service: Not on file    Active member of club or organization: Not on file    Attends meetings of clubs or  organizations: Not on file    Relationship status: Not on file  Other Topics Concern  . Not on file  Social History Narrative   Patient lives at home alone.   Caffeine Use: occasionally soda   Allergies  Allergen Reactions  . Latex   . Other     Seasonal   . Lubricants    Family History  Problem Relation Age of Onset  . Diabetes Father        type 2  . Depression Father   . Dementia Father   . Narcolepsy Father        cataplexy  . High Cholesterol Father   . Stroke Maternal Grandmother   . Heart disease Maternal Grandfather       Current Outpatient Medications (Respiratory):  .  fexofenadine (ALLEGRA) 180 MG tablet, Take 180 mg by mouth daily. .  Triamcinolone Acetonide (NASACORT ALLERGY 24HR) 55 MCG/ACT AERO, Place 2 sprays into the nose 2 (two) times daily.    Current Outpatient Medications (Other):  .  amphetamine-dextroamphetamine (ADDERALL) 20 MG tablet, Take 0.5 tablets (10 mg total) by mouth 2 (two) times daily as needed. Marland Kitchen  b complex vitamins capsule, Take 1 capsule by mouth daily. .  Bepotastine Besilate (BEPREVE)  1.5 % SOLN, Place 1 drop into both eyes once. .  betamethasone valerate lotion (VALISONE) 0.1 %, Apply 1 application topically daily. Marland Kitchen.  desonide (DESOWEN) 0.05 % lotion, Apply 1 application topically 2 (two) times daily. Marland Kitchen.  gabapentin (NEURONTIN) 100 MG capsule, Take 2 capsules (200 mg total) by mouth at bedtime. .  Multiple Vitamin (MULTIVITAMIN) tablet, Take 1 tablet by mouth daily. .  pimecrolimus (ELIDEL) 1 % cream, Apply 1 application topically 2 (two) times daily. Marland Kitchen.  venlafaxine XR (EFFEXOR XR) 37.5 MG 24 hr capsule, Take 1 capsule (37.5 mg total) by mouth daily with breakfast.    Past medical history, social, surgical and family history all reviewed in electronic medical record.  No pertanent information unless stated regarding to the chief complaint.   Review of Systems:  No  visual changes, nausea, vomiting, diarrhea, constipation,  dizziness, abdominal pain, skin rash, fevers, chills, night sweats, weight loss, swollen lymph nodes, body aches, joint swelling,  chest pain, shortness of breath, mood changes.  Positive muscle aches, headache  Objective  Blood pressure 118/84, height 5\' 5"  (1.651 m), weight 193 lb (87.5 kg).    General: No apparent distress alert and oriented x3 mood and affect normal, dressed appropriately.  HEENT: Pupils equal, extraocular movements intact  Respiratory: Patient's speak in full sentences and does not appear short of breath  Cardiovascular: No lower extremity edema, non tender, no erythema  Skin: Warm dry intact with no signs of infection or rash on extremities or on axial skeleton.  Abdomen: Soft nontender  Neuro: Cranial nerves II through XII are intact, neurovascularly intact in all extremities with 2+ DTRs and 2+ pulses.  Lymph: No lymphadenopathy of posterior or anterior cervical chain or axillae bilaterally.  Gait normal with good balance and coordination.  MSK:  Non tender with full range of motion and good stability and symmetric strength and tone of shoulders, elbows, , hip, knee and ankles bilaterally.  Neck: Inspection loss of lordosis. No palpable stepoffs. Positive Spurling's maneuver bilaterally. Full neck range of motion Grip strength and sensation normal in bilateral hands Mild to moderate weakness in the C6 and C8 distribution bilaterally Negative Hoffman sign bilaterally Reflexes normal  Wrist exam does show a positive Tinel's over the carpal tunnel bilaterally   Impression and Recommendations:     This case required medical decision making of moderate complexity. The above documentation has been reviewed and is accurate and complete Alicia SaaZachary M Smith, DO       Note: This dictation was prepared with Dragon dictation along with smaller phrase technology. Any transcriptional errors that result from this process are unintentional.

## 2018-06-21 NOTE — Patient Instructions (Addendum)
Good to see you  I would recommend the epidural  Call [phone] to schedule.  I think that is our best option  I am impressed how active you are and keep it up  Ice is your friend.  See me again in 8-12 weeks

## 2018-06-21 NOTE — Assessment & Plan Note (Signed)
Continues to be somewhat there.  More of the neck likely low.  Patient is encouraged to try another epidural.  Will monitor.

## 2018-06-21 NOTE — Assessment & Plan Note (Addendum)
Spent  25 minutes with patient face-to-face and had greater than 50% of counseling including as described in assessment and plan.  Worsening symptoms.  Still believe that this is likely contributing to most of patient's pain and radicular symptoms.  Discussed with patient about icing regimen, home exercises, we did reschedule another epidural at this time.  Patient has been noncompliant with the other medications including the Effexor and the gabapentin.  Patient swelling try the injection and see me again in 2 to 3 weeks afterwards.  Encouraged her to continue her other modalities including dry needling.  Follow-up again after the epidural.

## 2018-07-10 DIAGNOSIS — J3081 Allergic rhinitis due to animal (cat) (dog) hair and dander: Secondary | ICD-10-CM | POA: Diagnosis not present

## 2018-07-10 DIAGNOSIS — J3089 Other allergic rhinitis: Secondary | ICD-10-CM | POA: Diagnosis not present

## 2018-07-10 DIAGNOSIS — J301 Allergic rhinitis due to pollen: Secondary | ICD-10-CM | POA: Diagnosis not present

## 2018-07-11 ENCOUNTER — Encounter: Payer: Self-pay | Admitting: Neurology

## 2018-07-13 DIAGNOSIS — J3089 Other allergic rhinitis: Secondary | ICD-10-CM | POA: Diagnosis not present

## 2018-07-13 DIAGNOSIS — J301 Allergic rhinitis due to pollen: Secondary | ICD-10-CM | POA: Diagnosis not present

## 2018-07-16 ENCOUNTER — Ambulatory Visit: Payer: BLUE CROSS/BLUE SHIELD | Admitting: Neurology

## 2018-07-16 ENCOUNTER — Other Ambulatory Visit: Payer: Self-pay | Admitting: *Deleted

## 2018-07-16 DIAGNOSIS — G4710 Hypersomnia, unspecified: Secondary | ICD-10-CM

## 2018-07-16 DIAGNOSIS — G4730 Sleep apnea, unspecified: Secondary | ICD-10-CM

## 2018-07-16 DIAGNOSIS — Z9989 Dependence on other enabling machines and devices: Secondary | ICD-10-CM

## 2018-07-16 DIAGNOSIS — G4733 Obstructive sleep apnea (adult) (pediatric): Secondary | ICD-10-CM

## 2018-07-16 MED ORDER — AMPHETAMINE-DEXTROAMPHETAMINE 20 MG PO TABS: 10.0000 mg | ORAL_TABLET | Freq: Two times a day (BID) | ORAL | 0 refills | Status: DC | PRN

## 2018-07-16 NOTE — Telephone Encounter (Signed)
[facility] checked. Pt is due for a refill. Pt missed her appt today due to being late but is scheduled for tomorrow.

## 2018-07-16 NOTE — Telephone Encounter (Signed)
Needs Ad[facility]all 10 [facility] to [facility] [address]  Please be sure it is marked as generic.

## 2018-07-17 ENCOUNTER — Ambulatory Visit (INDEPENDENT_AMBULATORY_CARE_PROVIDER_SITE_OTHER): Payer: BLUE CROSS/BLUE SHIELD | Admitting: Neurology

## 2018-07-17 ENCOUNTER — Encounter: Payer: Self-pay | Admitting: Neurology

## 2018-07-17 VITALS — BP 112/73 | HR 59 | Ht 65.5 in | Wt 195.0 lb

## 2018-07-17 DIAGNOSIS — J3081 Allergic rhinitis due to animal (cat) (dog) hair and dander: Secondary | ICD-10-CM | POA: Diagnosis not present

## 2018-07-17 DIAGNOSIS — G4710 Hypersomnia, unspecified: Secondary | ICD-10-CM | POA: Diagnosis not present

## 2018-07-17 DIAGNOSIS — G4733 Obstructive sleep apnea (adult) (pediatric): Secondary | ICD-10-CM

## 2018-07-17 DIAGNOSIS — Z9989 Dependence on other enabling machines and devices: Secondary | ICD-10-CM | POA: Diagnosis not present

## 2018-07-17 DIAGNOSIS — J301 Allergic rhinitis due to pollen: Secondary | ICD-10-CM | POA: Diagnosis not present

## 2018-07-17 DIAGNOSIS — G4730 Sleep apnea, unspecified: Secondary | ICD-10-CM | POA: Diagnosis not present

## 2018-07-17 DIAGNOSIS — J3089 Other allergic rhinitis: Secondary | ICD-10-CM | POA: Diagnosis not present

## 2018-07-17 MED ORDER — AMPHETAMINE-DEXTROAMPHETAMINE 10 MG PO TABS: 10.0000 mg | ORAL_TABLET | Freq: Two times a day (BID) | ORAL | 0 refills | Status: DC

## 2018-07-17 NOTE — Patient Instructions (Addendum)
As discussed I will change your generic Adderall Rx to 10 mg up to twice daily. I will see you in follow up in 8 months.

## 2018-07-17 NOTE — Progress Notes (Signed)
Subjective:    Patient ID: Alicia [name] is a 52 y.o. female.  HPI     Interim history:   Alicia [name] is a very pleasant 52 year old right-handed woman with an underlying medical history of allergies, hyperlipidemia, and obstructive sleep apnea on CPAP with residual daytime somnolence, who presents for follow-up consultation of her sleep apnea and hypersomnolence d/o. The patient is unaccompanied today. I last saw her on 01/11/2018, at which time she was fully compliant with her CPAP. She was doing quite well on her stimulant but was not always taking her second dose. She had significant neck pain and had been to physical therapy, had injection under neurosurgery as well. I suggested we maintain her low dose Adderall generic 10 mg twice a day.  Today, 07/17/2018: I reviewed her CPAP compliance data from 06/12/2018 through 07/11/2018 which is a total of 30 days, during which time she used her CPAP 29 days with percent used days greater than 4 hours at 90%, indicating excellent compliance with an average usage of 6 hours and 57 minutes, residual AHI borderline at 4.9 per hour, leak acceptable with the 95th percentile at 8.6 L/m on a pressure of 11 cm with EPR of 2. She reports generally doing well overall, uses only 10 mg of generic Adderall at a time, breaks the 20 mg strength in half and uses only half pill to up to a whole pill daily. She would favor the 10 mg prescription so she does not have to split the pills in half.    The patient's allergies, current medications, family history, past medical history, past social history, past surgical history and problem list were reviewed and updated as appropriate.    Previously (copied from previous notes for reference):    I reviewed her CPAP compliance data from 12/09/2017 through 01/07/2018 which is a total of 30 days, during which time she used her CPAP every night with percent used days greater than 4 hours at 100%, indicating superb  compliance with an average usage of 7 hours and 14 minutes, residual AHI borderline at 5.9 per hour, leaked low with the 95th percentile at 4.4 L/m on a pressure of 10 cm with EPR of 2.  I saw her on 07/13/2017, at which time she was compliant with CPAP. She had issues with neck pain and went through physical therapy with dry needling. She was supposed to see orthopedics She was doing reasonably well on her Adderall and was compliant with CPAP therapy. I suggested a six-month follow-up.    I saw her on 01/05/2017, at which time she was compliant with her CPAP. She had occasional difficulty maintaining sleep at night, was attributing this to taking the Adderall. She was trying to be careful not to take it too late after lunch. I suggested we continue Adderall immediate release 10 mg twice a day.    I reviewed her CPAP compliance data from 06/11/2017 through 07/10/2017 which is a total of 30 days, during which time she used her CPAP every night with percent used days greater than 4 hours at 100%, indicating superb compliance with an average usage of 7 hours and 11 minutes, residual AHI borderline at 6.4 per hour, leak acceptable with the 95th percentile at 12.6 L/m on a pressure of 10 cm with EPR of 2.    I reviewed her CPAP compliance data from 12/06/2016 through 01/04/2017 which is a total of 30 days, during which time she used her CPAP every night with percent used days  greater than 4 hours at 100%, indicating superb compliance with an average usage of 6 hours and 29 minutes, residual AHI at goal at 3.2 per hour, leak low with the 95th percentile at 3.1 L/m on a pressure of 10 cm with EPR of 2.     I saw her on 07/07/2016 after a gap of nearly 2 years, at which time she reported doing okay, weight had been fluctuating, but was going to the gym on a fairly regular basis. She was fully compliant with CPAP, still had sleepiness during the day especially in the afternoons, particularly after eating a  carb-laden meal. Other than that, she was stable, taking a vitamin D supplement and other supplements, vitamin D was low per her report and she was told to start taking over-the-counter vit D per PCP. She had not tried the Adderall that I suggested at the last visit and had stopped taking the Nuvigil even though it worked okay because of was too expensive. I suggested starting her on Adderall immediate release 10 mg once daily in the early afternoon. She emailed in the interim in November 2017, reporting being a little bit more focused during the day and not overeating as much but as far as daytime somnolence she did not notice any telltale was palms. I suggested we increase the Adderall to 10 mg twice daily, second dose no later than 3 PM to avoid nighttime insomnia.   I saw her on 08/21/2014, at which time she reported doing well on Nuvigil 150 mg once daily and she felt that it was better than the Provigil generic. She had some residual sleepiness but felt improved overall. She was compliant with CPAP therapy. I suggested we try low-dose Adderall as needed for residual sleepiness.   I reviewed her CPAP compliance data from 06/07/2016 through 07/06/2016 which is a total of 30 days, during which time she used her machine every night with percent used days greater than 4 hours at 100%, indicating superb compliance with an average usage of 7 hours and 4 minutes, residual AHI 4.1 per hour, leak low for the 95th percentile at 2.6 L/m on a pressure of 10 cm with EPR of 2.   I saw her on 02/13/2014, at which time she continued to endorse daytime somnolence. She had just been approved for Nuvigil but had not started it yet. She was fully compliant with CPAP therapy.   I reviewed her compliance data from the compliance card on her machine: 07/22/2014 through 08/20/2014 which is a total of 30 days during which time she used her machine every night with percent used days greater than 4 hours of 97%, indicating  excellent compliance, pressure still at 10 cm with EPR of 2. Residual AHI acceptable at 4.9 per hour and leak generally low with the 95th percentile of leak at 6.2 L/m, average usage of 7 hours and 19 minutes. She is compliant with her CPAP machine, she has a [employer] nasal pillows. She has exacerbation of her allergies and also facial eczema. She has recently seen a dermatologist and was given a new ointment.   I saw her on 08/15/2013, at which time we talked about her sleep test results from 2013 and her CPAP compliance which was excellent. She has a family history of narcolepsy with cataplexy in her father. She felt Provigil as needed was helpful. She had trouble remembering the second dose of Provigil and therefore I suggested a trial of Nuvigil once daily. She was approved for  this.    I first met her on 05/23/2013, at which time she reported being veyr sleepy despite being compliant with CPAP therapy. I suggested that she try Provigil to help stay awake during the day a little better, starting with 200 mg strength half a pill up to twice daily as needed. I advised her not to take it after 3 PM to avoid insomnia at night.  She has a FHx of narcolepsy with cataplexy in her father and was particularly concerned that she may have narcolepsy.    I had reviewed records including a recent CPAP compliance download from 04/02/2013 through 05/22/2013, total of 51 days, during which time she used CPAP every day. Percent used days greater than 4 hours was 96% indicating excellent compliance. Her CPAP pressure is 10 cm water pressure with an EPR of 2. Residual AHI is 4.7 indicating an appropriate pressure level. Average usage was 6:41 hours.    I reviewed a sleep study report from 11/10/2011. This was a split-night study. REM percentage was 17% and the baseline portion of the study. Her AHI was 61.9 per hour with an oxyhemoglobin desaturation nadir of 75%. She was titrated on CPAP from 4-10 cm of water pressure  with elimination of her sleep disordered breathing reported.    She uses a nasal pillows mask. I also reviewed older compliance data from August to Nov. 2013, during which time she was very compliant but had a high leak. At the time, she was using a FFM and leak was much better with the new mask.   Her Past Medical History Is Significant For: Past Medical History:  Diagnosis Date  . Allergy   . Hypersomnia with sleep apnea, unspecified 05/23/2013  . OSA on CPAP 08/15/2013    Her Past Surgical History Is Significant For: Past Surgical History:  Procedure Laterality Date  . HYSTEROSCOPY W/D&C    . WISDOM TOOTH EXTRACTION      Her Family History Is Significant For: Family History  Problem Relation Age of Onset  . Diabetes Father        type 2  . Depression Father   . Dementia Father   . Narcolepsy Father        cataplexy  . High Cholesterol Father   . Stroke Maternal Grandmother   . Heart disease Maternal Grandfather     Her Social History Is Significant For: Social History   Socioeconomic History  . Marital status: Single    Spouse name: Not on file  . Number of children: 0  . Years of education: Post Grad  . Highest education level: Not on file  Occupational History    Comment: [employer]  . Financial resource strain: Not on file  . Food insecurity:    Worry: Not on file    Inability: Not on file  . Transportation needs:    Medical: Not on file    Non-medical: Not on file  Tobacco Use  . Smoking status: Never Smoker  . Smokeless tobacco: Never Used  Substance and Sexual Activity  . Alcohol use: No  . Drug use: No  . Sexual activity: Never  Lifestyle  . Physical activity:    Days per week: Not on file    Minutes per session: Not on file  . Stress: Not on file  Relationships  . Social connections:    Talks on phone: Not on file    Gets together: Not on file    Attends religious service:  Not on file    Active member of club or  organization: Not on file    Attends meetings of clubs or organizations: Not on file    Relationship status: Not on file  Other Topics Concern  . Not on file  Social History Narrative   Patient lives at home alone.   Caffeine Use: occasionally soda    Her Allergies Are:  Allergies  Allergen Reactions  . Latex   . Other     Seasonal   . Lubricants   :   Her Current Medications Are:  Outpatient Encounter Medications as of 07/17/2018  Medication Sig  . amphetamine-dextroamphetamine (ADDERALL) 20 MG tablet Take 0.5 tablets (10 mg total) by mouth 2 (two) times daily as needed.  Marland Kitchen b complex vitamins capsule Take 1 capsule by mouth daily.  . Bepotastine Besilate (BEPREVE) 1.5 % SOLN Place 1 drop into both eyes once.  . betamethasone valerate lotion (VALISONE) 0.1 % Apply 1 application topically daily.  Marland Kitchen desonide (DESOWEN) 0.05 % lotion Apply 1 application topically 2 (two) times daily.  . fexofenadine (ALLEGRA) 180 MG tablet Take 180 mg by mouth daily.  . Multiple Vitamin (MULTIVITAMIN) tablet Take 1 tablet by mouth daily.  . pimecrolimus (ELIDEL) 1 % cream Apply 1 application topically 2 (two) times daily.  . Triamcinolone Acetonide (NASACORT ALLERGY 24HR) 55 MCG/ACT AERO Place 2 sprays into the nose 2 (two) times daily.   No facility-administered encounter medications on file as of 07/17/2018.   :  Review of Systems:  Out of a complete 14 point review of systems, all are reviewed and negative with the exception of these symptoms as listed below:  Review of Systems  Neurological:       Pt presents today to discuss her adderall which she reports is going well.    Objective:  Neurological Exam  Physical Exam Physical Examination:   Vitals:   07/17/18 1619  BP: 112/73  Pulse: (!) 59    General Examination: The patient is a very pleasant 53 y.o. female in no acute distress. She appears well-developed and well-nourished and well groomed.   HEENT:Normocephalic,  atraumatic, pupils are equal, round and reactive to light and accommodation. Extraocular tracking is good without limitation to gaze excursion or nystagmus noted. Normal smooth pursuit is noted. Hearing is grossly intact. Face is symmetric with normal facial animation and normal facial sensation. Speech is clear with no dysarthria noted. There is no hypophonia. There is no lip, neck/head, jaw or voice tremor. Neckwith some discomfort, oropharynx exam reveals: no obvious change.   Chest:Clear to auscultation without wheezing, rhonchi or crackles noted.  Heart:S1+S2+0, regular and normal without murmurs, rubs or gallops noted.   Abdomen:Soft, non-tender and non-distended.  Extremities:There is noobvious edema in the distal lower extremities bilaterally.   Skin: Warm and dry without trophic changes noted.  Musculoskeletal: exam reveals no obvious joint deformities, tenderness or joint swelling or erythema.   Neurologically:  Mental status: The patient is awake, alert and oriented in all 4 spheres. Herimmediate and remote memory, attention, language skills and fund of knowledge are appropriate. There is no evidence of aphasia, agnosia, apraxia or anomia. Speech is clear with normal prosody and enunciation. Thought process is linear. Mood is normaland affect is normal.  Cranial nerves II - XII are as described above under HEENT exam.  Motor exam: Normal bulk, strength and tone is noted. There is no drift, tremor or rebound. Fine motor skills and coordination: intact.  Cerebellar testing: No  dysmetria or intention tremor. There is no truncal or gait ataxia.  Sensory exam: intact to light touch in the upper and lower extremities.  Gait, station and balance: Shestands easily. No veering to one side is noted. No leaning to one side is noted. Posture is age-appropriate and stance is narrow based. Gait shows normalstride length and normalpace. No problems turning are noted.    Assessment and Plan:  In summary, Alicia Kilimanjarois a very pleasant 52 year old femalewith an underlying medical history of allergies and obesity, who returns for follow-up consultation of her OSA and hypersomnolence disorder. She is well established on CPAP therapy and compliant. She is on symptomatic treatment with low-dose Adderall immediate release generic, up to twice daily. We mutually agreed to change the prescription to 10 mg strength for the Adderall so she does not have to break the 20 mg strength and half. She can take 1 pill up to twice daily. I renewed her prescription and suggested a follow-up in about 8 months routinely, sooner if needed. She is encouraged to continue to be compliant with her CPAP. She has a family history of sleep apnea as well as narcolepsy. I answered all their questions today and the patient was in agreement.  I spent 15 minutes in total face-to-face time with the patient, more than 50% of which was spent in counseling and coordination of care, reviewing test results, reviewing medication and discussing or reviewing the diagnosis of sleep apnea and sleepiness d/o, its prognosis and treatment options. Pertinent laboratory and imaging test results that were available during this visit with the patient were reviewed by me and considered in my medical decision making (see chart for details).

## 2018-07-18 ENCOUNTER — Encounter: Payer: Self-pay | Admitting: Neurology

## 2018-07-19 DIAGNOSIS — J3089 Other allergic rhinitis: Secondary | ICD-10-CM | POA: Diagnosis not present

## 2018-07-19 DIAGNOSIS — J3081 Allergic rhinitis due to animal (cat) (dog) hair and dander: Secondary | ICD-10-CM | POA: Diagnosis not present

## 2018-07-19 DIAGNOSIS — J301 Allergic rhinitis due to pollen: Secondary | ICD-10-CM | POA: Diagnosis not present

## 2018-07-24 DIAGNOSIS — J301 Allergic rhinitis due to pollen: Secondary | ICD-10-CM | POA: Diagnosis not present

## 2018-07-24 DIAGNOSIS — R21 Rash and other nonspecific skin eruption: Secondary | ICD-10-CM | POA: Diagnosis not present

## 2018-07-24 DIAGNOSIS — J3081 Allergic rhinitis due to animal (cat) (dog) hair and dander: Secondary | ICD-10-CM | POA: Diagnosis not present

## 2018-07-24 DIAGNOSIS — J3089 Other allergic rhinitis: Secondary | ICD-10-CM | POA: Diagnosis not present

## 2018-08-03 DIAGNOSIS — H524 Presbyopia: Secondary | ICD-10-CM | POA: Diagnosis not present

## 2018-08-03 DIAGNOSIS — H5213 Myopia, bilateral: Secondary | ICD-10-CM | POA: Diagnosis not present

## 2018-08-06 DIAGNOSIS — J3089 Other allergic rhinitis: Secondary | ICD-10-CM | POA: Diagnosis not present

## 2018-08-06 DIAGNOSIS — G4733 Obstructive sleep apnea (adult) (pediatric): Secondary | ICD-10-CM | POA: Diagnosis not present

## 2018-08-06 DIAGNOSIS — J3081 Allergic rhinitis due to animal (cat) (dog) hair and dander: Secondary | ICD-10-CM | POA: Diagnosis not present

## 2018-08-06 DIAGNOSIS — J301 Allergic rhinitis due to pollen: Secondary | ICD-10-CM | POA: Diagnosis not present

## 2018-08-09 DIAGNOSIS — J301 Allergic rhinitis due to pollen: Secondary | ICD-10-CM | POA: Diagnosis not present

## 2018-08-09 DIAGNOSIS — J3081 Allergic rhinitis due to animal (cat) (dog) hair and dander: Secondary | ICD-10-CM | POA: Diagnosis not present

## 2018-08-09 DIAGNOSIS — J3089 Other allergic rhinitis: Secondary | ICD-10-CM | POA: Diagnosis not present

## 2018-08-16 DIAGNOSIS — J3081 Allergic rhinitis due to animal (cat) (dog) hair and dander: Secondary | ICD-10-CM | POA: Diagnosis not present

## 2018-08-16 DIAGNOSIS — J3089 Other allergic rhinitis: Secondary | ICD-10-CM | POA: Diagnosis not present

## 2018-08-16 DIAGNOSIS — J301 Allergic rhinitis due to pollen: Secondary | ICD-10-CM | POA: Diagnosis not present

## 2018-08-28 NOTE — Progress Notes (Signed)
[name] [address] [place], [place] [address] Phone: [phone] Subjective:    I Alicia [name] am serving as a Neurosurgeon for Dr. Antoine Primas.    CC: Neck pain follow-up  [id number]  Alicia [name] is a 52 y.o. female coming in with complaint of neck pain. States that everything hurts. Feet are painful. States that it is plantar fascia. Has been rolling her feet on tennis balls. Has orthotics from her podiatrist.  Known severe arthritic changes of the neck.  Patient has been delaying any type of epidural or any surgical intervention.  Continues to have tingling in the hands on a regular basis.  Sometimes does stop her from daily activities.  Rates the severity of pain is 7 out of 10.      Past Medical History:  Diagnosis Date  . Allergy   . Hypersomnia with sleep apnea, unspecified 05/23/2013  . OSA on CPAP 08/15/2013   Past Surgical History:  Procedure Laterality Date  . HYSTEROSCOPY W/D&C    . WISDOM TOOTH EXTRACTION     Social History   Socioeconomic History  . Marital status: Single    Spouse name: Not on file  . Number of children: 0  . Years of education: Post Grad  . Highest education level: Not on file  Occupational History    Comment: [employer]  Social Needs  . Financial resource strain: Not on file  . Food insecurity:    Worry: Not on file    Inability: Not on file  . Transportation needs:    Medical: Not on file    Non-medical: Not on file  Tobacco Use  . Smoking status: Never Smoker  . Smokeless tobacco: Never Used  Substance and Sexual Activity  . Alcohol use: No  . Drug use: No  . Sexual activity: Never  Lifestyle  . Physical activity:    Days per week: Not on file    Minutes per session: Not on file  . Stress: Not on file  Relationships  . Social connections:    Talks on phone: Not on file    Gets together: Not on file    Attends religious service: Not on file    Active member of club  or organization: Not on file    Attends meetings of clubs or organizations: Not on file    Relationship status: Not on file  Other Topics Concern  . Not on file  Social History Narrative   Patient lives at home alone.   Caffeine Use: occasionally soda   Allergies  Allergen Reactions  . Latex   . Other     Seasonal   . Lubricants    Family History  Problem Relation Age of Onset  . Diabetes Father        type 2  . Depression Father   . Dementia Father   . Narcolepsy Father        cataplexy  . High Cholesterol Father   . Stroke Maternal Grandmother   . Heart disease Maternal Grandfather       Current Outpatient Medications (Respiratory):  .  fexofenadine (ALLEGRA) 180 MG tablet, Take 180 mg by mouth daily. .  Triamcinolone Acetonide (NASACORT ALLERGY 24HR) 55 MCG/ACT AERO, Place 2 sprays into the nose 2 (two) times daily.    Current Outpatient Medications (Other):  .  amphetamine-dextroamphetamine (ADDERALL) 10 MG tablet, Take 1 tablet (10 mg total) by mouth 2 (two) times daily with a meal. .  b complex vitamins capsule, Take 1 capsule by mouth daily. .  Bepotastine Besilate (BEPREVE) 1.5 % SOLN, Place 1 drop into both eyes once. .  betamethasone valerate lotion (VALISONE) 0.1 %, Apply 1 application topically daily. Marland Kitchen  desonide (DESOWEN) 0.05 % lotion, Apply 1 application topically 2 (two) times daily. .  Multiple Vitamin (MULTIVITAMIN) tablet, Take 1 tablet by mouth daily. .  pimecrolimus (ELIDEL) 1 % cream, Apply 1 application topically 2 (two) times daily. .  Vitamin D, Ergocalciferol, (DRISDOL) 1.25 MG (50000 UT) CAPS capsule, Take 1 capsule (50,000 Units total) by mouth every 7 (seven) days.    Past medical history, social, surgical and family history all reviewed in electronic medical record.  No pertanent information unless stated regarding to the chief complaint.   Review of Systems:  No  visual changes, nausea, vomiting, diarrhea, constipation, dizziness,  abdominal pain, skin rash, fevers, chills, night sweats, weight loss, swollen lymph nodes, body aches, joint swelling, chest pain, shortness of breath, mood changes.  Positive muscle aches, headaches  Objective  Blood pressure 102/70, pulse 62, height 5' 5.5" (1.664 m), weight 198 lb (89.8 kg), SpO2 98 %.    General: No apparent distress alert and oriented x3 mood and affect normal, dressed appropriately.  HEENT: Pupils equal, extraocular movements intact  Respiratory: Patient's speak in full sentences and does not appear short of breath  Cardiovascular: No lower extremity edema, non tender, no erythema  Skin: Warm dry intact with no signs of infection or rash on extremities or on axial skeleton.  Abdomen: Soft nontender  Neuro: Cranial nerves II through XII are intact, neurovascularly intact in all extremities with 2+ DTRs and 2+ pulses.  Lymph: No lymphadenopathy of posterior or anterior cervical chain or axillae bilaterally.  Gait normal with good balance and coordination.  MSK:  Non tender with full range of motion and good stability and symmetric strength and tone of shoulders, elbows, wrist, hip, knee and ankles bilaterally.  Patient does have some mild weakness of grip strength  Neck: Inspection loss of lordosis. No palpable stepoffs. Positive Spurling's maneuver. Limited in all planes by at least 5 degrees.  Only has 10 degrees of extension of the neck Weakness in the C7 and C8 distribution Negative Hoffman sign bilaterally Reflexes normal Severe tightness of the musculature around the neck  Patient's foot exam shows the patient does have pes planus with overpronation of the hindfoot.  Hallux rigidus on the right side noted as well.  Mild splaying between the first and second toes on the right side    Impression and Recommendations:      The above documentation has been reviewed and is accurate and complete Judi Saa, DO       Note: This dictation was prepared  with Dragon dictation along with smaller phrase technology. Any transcriptional errors that result from this process are unintentional.

## 2018-08-30 ENCOUNTER — Ambulatory Visit (INDEPENDENT_AMBULATORY_CARE_PROVIDER_SITE_OTHER): Payer: BLUE CROSS/BLUE SHIELD | Admitting: Family Medicine

## 2018-08-30 ENCOUNTER — Encounter: Payer: Self-pay | Admitting: Family Medicine

## 2018-08-30 DIAGNOSIS — J3089 Other allergic rhinitis: Secondary | ICD-10-CM | POA: Diagnosis not present

## 2018-08-30 DIAGNOSIS — M5030 Other cervical disc degeneration, unspecified cervical region: Secondary | ICD-10-CM | POA: Diagnosis not present

## 2018-08-30 DIAGNOSIS — J301 Allergic rhinitis due to pollen: Secondary | ICD-10-CM | POA: Diagnosis not present

## 2018-08-30 DIAGNOSIS — M2141 Flat foot [pes planus] (acquired), right foot: Secondary | ICD-10-CM

## 2018-08-30 DIAGNOSIS — M2142 Flat foot [pes planus] (acquired), left foot: Secondary | ICD-10-CM

## 2018-08-30 DIAGNOSIS — M2140 Flat foot [pes planus] (acquired), unspecified foot: Secondary | ICD-10-CM | POA: Insufficient documentation

## 2018-08-30 DIAGNOSIS — J3081 Allergic rhinitis due to animal (cat) (dog) hair and dander: Secondary | ICD-10-CM | POA: Diagnosis not present

## 2018-08-30 MED ORDER — VITAMIN D (ERGOCALCIFEROL) 1.25 MG (50000 UNIT) PO CAPS: 50000.0000 [IU] | ORAL_CAPSULE | ORAL | 0 refills | Status: DC

## 2018-08-30 NOTE — Assessment & Plan Note (Signed)
Moderate to severe.  I do believe that this could be contributing to some of the back and neck pain.  Patient will be fitted for custom orthotics at a later date.  This will help with alignment.

## 2018-08-30 NOTE — Assessment & Plan Note (Signed)
Discussed with patient again in great length.  Patient does have a standing order for an epidural if she would like to have it done.  Has responded well to it in the past.  Patient denied wanting any type of injection such as for the carpal tunnel again.  Discussed about the possibility of trigger points with patient declined.  Patient also declined any type of formal physical therapy or the osteopathic manipulation.  We discussed icing regimen and home exercises and continue all other treatment options.  Spent  25 minutes with patient face-to-face and had greater than 50% of counseling including as described above in assessment and plan.

## 2018-08-30 NOTE — Patient Instructions (Addendum)
Good to see you  Gustavus Bryant is your friend Call [phone] if you want the epidural  We will get you set up for orthotics Once weekly Vitamin D for 12 weeks See me again in 3 months

## 2018-09-06 ENCOUNTER — Ambulatory Visit: Payer: BLUE CROSS/BLUE SHIELD | Admitting: Family Medicine

## 2018-09-06 NOTE — Progress Notes (Deleted)
Procedure Note   Patient was fitted for a : standard, cushioned, semi-rigid orthotic. The orthotic was heated and afterward the patient patient seated position and molded The patient was positioned in subtalar neutral position and 10 degrees of ankle dorsiflexion in a weight bearing stance. After completion of molding, patient did have orthotic management The blank was ground to a stable position for weight bearing. Size: Base: Carbon fiber Additional Posting and Padding:  The patient ambulated these, and they were very comfortable.  

## 2018-09-07 DIAGNOSIS — J3089 Other allergic rhinitis: Secondary | ICD-10-CM | POA: Diagnosis not present

## 2018-09-07 DIAGNOSIS — J3081 Allergic rhinitis due to animal (cat) (dog) hair and dander: Secondary | ICD-10-CM | POA: Diagnosis not present

## 2018-09-07 DIAGNOSIS — J301 Allergic rhinitis due to pollen: Secondary | ICD-10-CM | POA: Diagnosis not present

## 2018-09-09 NOTE — Progress Notes (Signed)
  Procedure Note   Sent to Clare GandyJeremy Schmitz for orthotics.  Patient was uncomfortable in the orthotics we had today.  No charge

## 2018-09-10 ENCOUNTER — Ambulatory Visit (INDEPENDENT_AMBULATORY_CARE_PROVIDER_SITE_OTHER): Payer: BLUE CROSS/BLUE SHIELD | Admitting: Family Medicine

## 2018-09-10 DIAGNOSIS — J3081 Allergic rhinitis due to animal (cat) (dog) hair and dander: Secondary | ICD-10-CM | POA: Diagnosis not present

## 2018-09-10 DIAGNOSIS — J3089 Other allergic rhinitis: Secondary | ICD-10-CM | POA: Diagnosis not present

## 2018-09-10 DIAGNOSIS — J301 Allergic rhinitis due to pollen: Secondary | ICD-10-CM | POA: Diagnosis not present

## 2018-09-12 ENCOUNTER — Telehealth: Payer: Self-pay | Admitting: Podiatry

## 2018-09-12 NOTE — Telephone Encounter (Signed)
Pt left message stating she has recently seen a sports doctor and was told by them her orthotics are too hard for her. She said they told her she needed a athletic style orthotic with padding.   I returned pts call and left a message that I was not sure what type of orthotic she received but I could talk with Raiford Nobleick or maybe schedule an appt for her to see Raiford NobleRick to discuss.

## 2018-09-27 DIAGNOSIS — N951 Menopausal and female climacteric states: Secondary | ICD-10-CM | POA: Diagnosis not present

## 2018-10-02 DIAGNOSIS — J3081 Allergic rhinitis due to animal (cat) (dog) hair and dander: Secondary | ICD-10-CM | POA: Diagnosis not present

## 2018-10-02 DIAGNOSIS — J301 Allergic rhinitis due to pollen: Secondary | ICD-10-CM | POA: Diagnosis not present

## 2018-10-02 DIAGNOSIS — J3089 Other allergic rhinitis: Secondary | ICD-10-CM | POA: Diagnosis not present

## 2018-10-05 ENCOUNTER — Other Ambulatory Visit: Payer: Self-pay | Admitting: Neurology

## 2018-10-05 MED ORDER — AMPHETAMINE-DEXTROAMPHETAMINE 10 MG PO TABS: 10.0000 mg | ORAL_TABLET | Freq: Two times a day (BID) | ORAL | 0 refills | Status: DC

## 2018-10-05 NOTE — Telephone Encounter (Signed)
[facility] Verified LR 07-17-18 Qty: 60 Pending appt: 03-25-2019

## 2018-10-18 DIAGNOSIS — J3081 Allergic rhinitis due to animal (cat) (dog) hair and dander: Secondary | ICD-10-CM | POA: Diagnosis not present

## 2018-10-18 DIAGNOSIS — J301 Allergic rhinitis due to pollen: Secondary | ICD-10-CM | POA: Diagnosis not present

## 2018-10-18 DIAGNOSIS — J3089 Other allergic rhinitis: Secondary | ICD-10-CM | POA: Diagnosis not present

## 2018-10-23 DIAGNOSIS — J3089 Other allergic rhinitis: Secondary | ICD-10-CM | POA: Diagnosis not present

## 2018-10-23 DIAGNOSIS — J301 Allergic rhinitis due to pollen: Secondary | ICD-10-CM | POA: Diagnosis not present

## 2018-10-23 DIAGNOSIS — J3081 Allergic rhinitis due to animal (cat) (dog) hair and dander: Secondary | ICD-10-CM | POA: Diagnosis not present

## 2018-10-29 DIAGNOSIS — J301 Allergic rhinitis due to pollen: Secondary | ICD-10-CM | POA: Diagnosis not present

## 2018-10-29 DIAGNOSIS — J3081 Allergic rhinitis due to animal (cat) (dog) hair and dander: Secondary | ICD-10-CM | POA: Diagnosis not present

## 2018-10-29 DIAGNOSIS — J3089 Other allergic rhinitis: Secondary | ICD-10-CM | POA: Diagnosis not present

## 2018-11-05 DIAGNOSIS — J3089 Other allergic rhinitis: Secondary | ICD-10-CM | POA: Diagnosis not present

## 2018-11-05 DIAGNOSIS — J301 Allergic rhinitis due to pollen: Secondary | ICD-10-CM | POA: Diagnosis not present

## 2018-11-05 DIAGNOSIS — J3081 Allergic rhinitis due to animal (cat) (dog) hair and dander: Secondary | ICD-10-CM | POA: Diagnosis not present

## 2018-11-08 DIAGNOSIS — J3081 Allergic rhinitis due to animal (cat) (dog) hair and dander: Secondary | ICD-10-CM | POA: Diagnosis not present

## 2018-11-08 DIAGNOSIS — J3089 Other allergic rhinitis: Secondary | ICD-10-CM | POA: Diagnosis not present

## 2018-11-08 DIAGNOSIS — J301 Allergic rhinitis due to pollen: Secondary | ICD-10-CM | POA: Diagnosis not present

## 2018-11-15 DIAGNOSIS — J3081 Allergic rhinitis due to animal (cat) (dog) hair and dander: Secondary | ICD-10-CM | POA: Diagnosis not present

## 2018-11-15 DIAGNOSIS — J3089 Other allergic rhinitis: Secondary | ICD-10-CM | POA: Diagnosis not present

## 2018-11-15 DIAGNOSIS — J301 Allergic rhinitis due to pollen: Secondary | ICD-10-CM | POA: Diagnosis not present

## 2018-11-19 DIAGNOSIS — J3089 Other allergic rhinitis: Secondary | ICD-10-CM | POA: Diagnosis not present

## 2018-11-19 DIAGNOSIS — J3081 Allergic rhinitis due to animal (cat) (dog) hair and dander: Secondary | ICD-10-CM | POA: Diagnosis not present

## 2018-11-19 DIAGNOSIS — J301 Allergic rhinitis due to pollen: Secondary | ICD-10-CM | POA: Diagnosis not present

## 2018-11-21 NOTE — Progress Notes (Deleted)
[name] [address] [place], [place] [address] Phone: [phone] Subjective:    I'm seeing this patient by the request  of:    CC:   CNO:[id number]  Alicia [name] is a 53 y.o. female coming in with complaint of ***  Onset-  Location Duration-  Character- Aggravating factors- Reliving factors-  Therapies tried-  Severity-     Past Medical History:  Diagnosis Date  . Allergy   . Hypersomnia with sleep apnea, unspecified 05/23/2013  . OSA on CPAP 08/15/2013   Past Surgical History:  Procedure Laterality Date  . HYSTEROSCOPY W/D&C    . WISDOM TOOTH EXTRACTION     Social History   Socioeconomic History  . Marital status: Single    Spouse name: Not on file  . Number of children: 0  . Years of education: Post Grad  . Highest education level: Not on file  Occupational History    Comment: [employer]  Social Needs  . Financial resource strain: Not on file  . Food insecurity:    Worry: Not on file    Inability: Not on file  . Transportation needs:    Medical: Not on file    Non-medical: Not on file  Tobacco Use  . Smoking status: Never Smoker  . Smokeless tobacco: Never Used  Substance and Sexual Activity  . Alcohol use: No  . Drug use: No  . Sexual activity: Never  Lifestyle  . Physical activity:    Days per week: Not on file    Minutes per session: Not on file  . Stress: Not on file  Relationships  . Social connections:    Talks on phone: Not on file    Gets together: Not on file    Attends religious service: Not on file    Active member of club or organization: Not on file    Attends meetings of clubs or organizations: Not on file    Relationship status: Not on file  Other Topics Concern  . Not on file  Social History Narrative   Patient lives at home alone.   Caffeine Use: occasionally soda   Allergies  Allergen Reactions  . Latex   . Other     Seasonal   . Lubricants    Family History    Problem Relation Age of Onset  . Diabetes Father        type 2  . Depression Father   . Dementia Father   . Narcolepsy Father        cataplexy  . High Cholesterol Father   . Stroke Maternal Grandmother   . Heart disease Maternal Grandfather       Current Outpatient Medications (Respiratory):  .  fexofenadine (ALLEGRA) 180 MG tablet, Take 180 mg by mouth daily. .  Triamcinolone Acetonide (NASACORT ALLERGY 24HR) 55 MCG/ACT AERO, Place 2 sprays into the nose 2 (two) times daily.    Current Outpatient Medications (Other):  .  amphetamine-dextroamphetamine (ADDERALL) 10 MG tablet, Take 1 tablet (10 mg total) by mouth 2 (two) times daily with a meal. .  b complex vitamins capsule, Take 1 capsule by mouth daily. .  Bepotastine Besilate (BEPREVE) 1.5 % SOLN, Place 1 drop into both eyes once. .  betamethasone valerate lotion (VALISONE) 0.1 %, Apply 1 application topically daily. Marland Kitchen  desonide (DESOWEN) 0.05 % lotion, Apply 1 application topically 2 (two) times daily. .  Multiple Vitamin (MULTIVITAMIN) tablet, Take 1 tablet by mouth daily. Marland Kitchen  pimecrolimus (ELIDEL) 1 % cream, Apply 1 application topically 2 (two) times daily. .  Vitamin D, Ergocalciferol, (DRISDOL) 1.25 MG (50000 UT) CAPS capsule, Take 1 capsule (50,000 Units total) by mouth every 7 (seven) days.    Past medical history, social, surgical and family history all reviewed in electronic medical record.  No pertanent information unless stated regarding to the chief complaint.   Review of Systems:  No headache, visual changes, nausea, vomiting, diarrhea, constipation, dizziness, abdominal pain, skin rash, fevers, chills, night sweats, weight loss, swollen lymph nodes, body aches, joint swelling, muscle aches, chest pain, shortness of breath, mood changes.   Objective  There were no vitals taken for this visit. Systems examined below as of    General: No apparent distress alert and oriented x3 mood and affect normal, dressed  appropriately.  HEENT: Pupils equal, extraocular movements intact  Respiratory: Patient's speak in full sentences and does not appear short of breath  Cardiovascular: No lower extremity edema, non tender, no erythema  Skin: Warm dry intact with no signs of infection or rash on extremities or on axial skeleton.  Abdomen: Soft nontender  Neuro: Cranial nerves II through XII are intact, neurovascularly intact in all extremities with 2+ DTRs and 2+ pulses.  Lymph: No lymphadenopathy of posterior or anterior cervical chain or axillae bilaterally.  Gait normal with good balance and coordination.  MSK:  Non tender with full range of motion and good stability and symmetric strength and tone of shoulders, elbows, wrist, hip, knee and ankles bilaterally.     Impression and Recommendations:     This case required medical decision making of moderate complexity. The above documentation has been reviewed and is accurate and complete Judi Saa, DO       Note: This dictation was prepared with Dragon dictation along with smaller phrase technology. Any transcriptional errors that result from this process are unintentional.

## 2018-11-22 ENCOUNTER — Ambulatory Visit: Payer: BLUE CROSS/BLUE SHIELD | Admitting: Family Medicine

## 2018-11-25 NOTE — Progress Notes (Deleted)
[name] [address] [place], [place] [address] Phone: [phone] Subjective:    I'm seeing this patient by the request  of:    CC:   CNO:[id number]  Alicia [name] is a 53 y.o. female coming in with complaint of ***  Onset-  Location Duration-  Character- Aggravating factors- Reliving factors-  Therapies tried-  Severity-     Past Medical History:  Diagnosis Date  . Allergy   . Hypersomnia with sleep apnea, unspecified 05/23/2013  . OSA on CPAP 08/15/2013   Past Surgical History:  Procedure Laterality Date  . HYSTEROSCOPY W/D&C    . WISDOM TOOTH EXTRACTION     Social History   Socioeconomic History  . Marital status: Single    Spouse name: Not on file  . Number of children: 0  . Years of education: Post Grad  . Highest education level: Not on file  Occupational History    Comment: [employer]  Social Needs  . Financial resource strain: Not on file  . Food insecurity:    Worry: Not on file    Inability: Not on file  . Transportation needs:    Medical: Not on file    Non-medical: Not on file  Tobacco Use  . Smoking status: Never Smoker  . Smokeless tobacco: Never Used  Substance and Sexual Activity  . Alcohol use: No  . Drug use: No  . Sexual activity: Never  Lifestyle  . Physical activity:    Days per week: Not on file    Minutes per session: Not on file  . Stress: Not on file  Relationships  . Social connections:    Talks on phone: Not on file    Gets together: Not on file    Attends religious service: Not on file    Active member of club or organization: Not on file    Attends meetings of clubs or organizations: Not on file    Relationship status: Not on file  Other Topics Concern  . Not on file  Social History Narrative   Patient lives at home alone.   Caffeine Use: occasionally soda   Allergies  Allergen Reactions  . Latex   . Other     Seasonal   . Lubricants    Family History    Problem Relation Age of Onset  . Diabetes Father        type 2  . Depression Father   . Dementia Father   . Narcolepsy Father        cataplexy  . High Cholesterol Father   . Stroke Maternal Grandmother   . Heart disease Maternal Grandfather       Current Outpatient Medications (Respiratory):  .  fexofenadine (ALLEGRA) 180 MG tablet, Take 180 mg by mouth daily. .  Triamcinolone Acetonide (NASACORT ALLERGY 24HR) 55 MCG/ACT AERO, Place 2 sprays into the nose 2 (two) times daily.    Current Outpatient Medications (Other):  .  amphetamine-dextroamphetamine (ADDERALL) 10 MG tablet, Take 1 tablet (10 mg total) by mouth 2 (two) times daily with a meal. .  b complex vitamins capsule, Take 1 capsule by mouth daily. .  Bepotastine Besilate (BEPREVE) 1.5 % SOLN, Place 1 drop into both eyes once. .  betamethasone valerate lotion (VALISONE) 0.1 %, Apply 1 application topically daily. Marland Kitchen  desonide (DESOWEN) 0.05 % lotion, Apply 1 application topically 2 (two) times daily. .  Multiple Vitamin (MULTIVITAMIN) tablet, Take 1 tablet by mouth daily. Marland Kitchen  pimecrolimus (ELIDEL) 1 % cream, Apply 1 application topically 2 (two) times daily. .  Vitamin D, Ergocalciferol, (DRISDOL) 1.25 MG (50000 UT) CAPS capsule, Take 1 capsule (50,000 Units total) by mouth every 7 (seven) days.    Past medical history, social, surgical and family history all reviewed in electronic medical record.  No pertanent information unless stated regarding to the chief complaint.   Review of Systems:  No headache, visual changes, nausea, vomiting, diarrhea, constipation, dizziness, abdominal pain, skin rash, fevers, chills, night sweats, weight loss, swollen lymph nodes, body aches, joint swelling, muscle aches, chest pain, shortness of breath, mood changes.   Objective  There were no vitals taken for this visit. Systems examined below as of    General: No apparent distress alert and oriented x3 mood and affect normal, dressed  appropriately.  HEENT: Pupils equal, extraocular movements intact  Respiratory: Patient's speak in full sentences and does not appear short of breath  Cardiovascular: No lower extremity edema, non tender, no erythema  Skin: Warm dry intact with no signs of infection or rash on extremities or on axial skeleton.  Abdomen: Soft nontender  Neuro: Cranial nerves II through XII are intact, neurovascularly intact in all extremities with 2+ DTRs and 2+ pulses.  Lymph: No lymphadenopathy of posterior or anterior cervical chain or axillae bilaterally.  Gait normal with good balance and coordination.  MSK:  Non tender with full range of motion and good stability and symmetric strength and tone of shoulders, elbows, wrist, hip, knee and ankles bilaterally.     Impression and Recommendations:     This case required medical decision making of moderate complexity. The above documentation has been reviewed and is accurate and complete Judi Saa, DO       Note: This dictation was prepared with Dragon dictation along with smaller phrase technology. Any transcriptional errors that result from this process are unintentional.

## 2018-11-26 ENCOUNTER — Ambulatory Visit: Payer: BLUE CROSS/BLUE SHIELD | Admitting: Family Medicine

## 2018-11-26 DIAGNOSIS — J3081 Allergic rhinitis due to animal (cat) (dog) hair and dander: Secondary | ICD-10-CM | POA: Diagnosis not present

## 2018-11-26 DIAGNOSIS — J3089 Other allergic rhinitis: Secondary | ICD-10-CM | POA: Diagnosis not present

## 2018-11-26 DIAGNOSIS — J301 Allergic rhinitis due to pollen: Secondary | ICD-10-CM | POA: Diagnosis not present

## 2018-12-03 DIAGNOSIS — J301 Allergic rhinitis due to pollen: Secondary | ICD-10-CM | POA: Diagnosis not present

## 2018-12-03 DIAGNOSIS — J3089 Other allergic rhinitis: Secondary | ICD-10-CM | POA: Diagnosis not present

## 2018-12-03 DIAGNOSIS — J3081 Allergic rhinitis due to animal (cat) (dog) hair and dander: Secondary | ICD-10-CM | POA: Diagnosis not present

## 2018-12-10 DIAGNOSIS — J3089 Other allergic rhinitis: Secondary | ICD-10-CM | POA: Diagnosis not present

## 2018-12-10 DIAGNOSIS — J301 Allergic rhinitis due to pollen: Secondary | ICD-10-CM | POA: Diagnosis not present

## 2018-12-10 DIAGNOSIS — J3081 Allergic rhinitis due to animal (cat) (dog) hair and dander: Secondary | ICD-10-CM | POA: Diagnosis not present

## 2018-12-12 NOTE — Progress Notes (Deleted)
[name] [address] [place], [place] [address] Phone: [phone] Subjective:    I'm seeing this patient by the request  of:    CC:   CNO:[id number]  Alicia [name] is a 53 y.o. female coming in with complaint of ***  Onset-  Location Duration-  Character- Aggravating factors- Reliving factors-  Therapies tried-  Severity-     Past Medical History:  Diagnosis Date  . Allergy   . Hypersomnia with sleep apnea, unspecified 05/23/2013  . OSA on CPAP 08/15/2013   Past Surgical History:  Procedure Laterality Date  . HYSTEROSCOPY W/D&C    . WISDOM TOOTH EXTRACTION     Social History   Socioeconomic History  . Marital status: Single    Spouse name: Not on file  . Number of children: 0  . Years of education: Post Grad  . Highest education level: Not on file  Occupational History    Comment: [employer]  Social Needs  . Financial resource strain: Not on file  . Food insecurity:    Worry: Not on file    Inability: Not on file  . Transportation needs:    Medical: Not on file    Non-medical: Not on file  Tobacco Use  . Smoking status: Never Smoker  . Smokeless tobacco: Never Used  Substance and Sexual Activity  . Alcohol use: No  . Drug use: No  . Sexual activity: Never  Lifestyle  . Physical activity:    Days per week: Not on file    Minutes per session: Not on file  . Stress: Not on file  Relationships  . Social connections:    Talks on phone: Not on file    Gets together: Not on file    Attends religious service: Not on file    Active member of club or organization: Not on file    Attends meetings of clubs or organizations: Not on file    Relationship status: Not on file  Other Topics Concern  . Not on file  Social History Narrative   Patient lives at home alone.   Caffeine Use: occasionally soda   Allergies  Allergen Reactions  . Latex   . Other     Seasonal   . Lubricants    Family History    Problem Relation Age of Onset  . Diabetes Father        type 2  . Depression Father   . Dementia Father   . Narcolepsy Father        cataplexy  . High Cholesterol Father   . Stroke Maternal Grandmother   . Heart disease Maternal Grandfather       Current Outpatient Medications (Respiratory):  .  fexofenadine (ALLEGRA) 180 MG tablet, Take 180 mg by mouth daily. .  Triamcinolone Acetonide (NASACORT ALLERGY 24HR) 55 MCG/ACT AERO, Place 2 sprays into the nose 2 (two) times daily.    Current Outpatient Medications (Other):  .  amphetamine-dextroamphetamine (ADDERALL) 10 MG tablet, Take 1 tablet (10 mg total) by mouth 2 (two) times daily with a meal. .  b complex vitamins capsule, Take 1 capsule by mouth daily. .  Bepotastine Besilate (BEPREVE) 1.5 % SOLN, Place 1 drop into both eyes once. .  betamethasone valerate lotion (VALISONE) 0.1 %, Apply 1 application topically daily. Marland Kitchen  desonide (DESOWEN) 0.05 % lotion, Apply 1 application topically 2 (two) times daily. .  Multiple Vitamin (MULTIVITAMIN) tablet, Take 1 tablet by mouth daily. Marland Kitchen  pimecrolimus (ELIDEL) 1 % cream, Apply 1 application topically 2 (two) times daily. .  Vitamin D, Ergocalciferol, (DRISDOL) 1.25 MG (50000 UT) CAPS capsule, Take 1 capsule (50,000 Units total) by mouth every 7 (seven) days.    Past medical history, social, surgical and family history all reviewed in electronic medical record.  No pertanent information unless stated regarding to the chief complaint.   Review of Systems:  No headache, visual changes, nausea, vomiting, diarrhea, constipation, dizziness, abdominal pain, skin rash, fevers, chills, night sweats, weight loss, swollen lymph nodes, body aches, joint swelling, muscle aches, chest pain, shortness of breath, mood changes.   Objective  There were no vitals taken for this visit. Systems examined below as of    General: No apparent distress alert and oriented x3 mood and affect normal, dressed  appropriately.  HEENT: Pupils equal, extraocular movements intact  Respiratory: Patient's speak in full sentences and does not appear short of breath  Cardiovascular: No lower extremity edema, non tender, no erythema  Skin: Warm dry intact with no signs of infection or rash on extremities or on axial skeleton.  Abdomen: Soft nontender  Neuro: Cranial nerves II through XII are intact, neurovascularly intact in all extremities with 2+ DTRs and 2+ pulses.  Lymph: No lymphadenopathy of posterior or anterior cervical chain or axillae bilaterally.  Gait normal with good balance and coordination.  MSK:  Non tender with full range of motion and good stability and symmetric strength and tone of shoulders, elbows, wrist, hip, knee and ankles bilaterally.     Impression and Recommendations:     This case required medical decision making of moderate complexity. The above documentation has been reviewed and is accurate and complete Judi Saa, DO       Note: This dictation was prepared with Dragon dictation along with smaller phrase technology. Any transcriptional errors that result from this process are unintentional.

## 2018-12-13 ENCOUNTER — Ambulatory Visit: Payer: BLUE CROSS/BLUE SHIELD | Admitting: Family Medicine

## 2018-12-17 DIAGNOSIS — J3081 Allergic rhinitis due to animal (cat) (dog) hair and dander: Secondary | ICD-10-CM | POA: Diagnosis not present

## 2018-12-17 DIAGNOSIS — J301 Allergic rhinitis due to pollen: Secondary | ICD-10-CM | POA: Diagnosis not present

## 2018-12-17 DIAGNOSIS — J3089 Other allergic rhinitis: Secondary | ICD-10-CM | POA: Diagnosis not present

## 2018-12-25 DIAGNOSIS — J3081 Allergic rhinitis due to animal (cat) (dog) hair and dander: Secondary | ICD-10-CM | POA: Diagnosis not present

## 2018-12-25 DIAGNOSIS — J301 Allergic rhinitis due to pollen: Secondary | ICD-10-CM | POA: Diagnosis not present

## 2018-12-25 DIAGNOSIS — J3089 Other allergic rhinitis: Secondary | ICD-10-CM | POA: Diagnosis not present

## 2018-12-29 ENCOUNTER — Other Ambulatory Visit: Payer: Self-pay

## 2018-12-31 MED ORDER — AMPHETAMINE-DEXTROAMPHETAMINE 10 MG PO TABS: 10.0000 mg | ORAL_TABLET | Freq: Two times a day (BID) | ORAL | 0 refills | Status: DC

## 2018-12-31 NOTE — Telephone Encounter (Signed)
[facility] Verified LR: 10-05-2018 Qty: 60 Pending appointment: 03-25-2019( Dr. Frances Furbish)

## 2019-01-03 DIAGNOSIS — J301 Allergic rhinitis due to pollen: Secondary | ICD-10-CM | POA: Diagnosis not present

## 2019-01-03 DIAGNOSIS — J3089 Other allergic rhinitis: Secondary | ICD-10-CM | POA: Diagnosis not present

## 2019-01-03 DIAGNOSIS — J3081 Allergic rhinitis due to animal (cat) (dog) hair and dander: Secondary | ICD-10-CM | POA: Diagnosis not present

## 2019-01-10 DIAGNOSIS — J3089 Other allergic rhinitis: Secondary | ICD-10-CM | POA: Diagnosis not present

## 2019-01-10 DIAGNOSIS — J3081 Allergic rhinitis due to animal (cat) (dog) hair and dander: Secondary | ICD-10-CM | POA: Diagnosis not present

## 2019-01-10 DIAGNOSIS — J301 Allergic rhinitis due to pollen: Secondary | ICD-10-CM | POA: Diagnosis not present

## 2019-01-20 NOTE — Progress Notes (Signed)
[name] [address] [place], [place] [address] Phone: [phone] Subjective:    Virtual Visit via Video Note  I connected with Alicia [name] on 01/21/19 at 11:30 AM EDT by a video enabled telemedicine application and verified that I am speaking with the correct person using two identifiers.   I discussed the limitations of evaluation and management by telemedicine and the availability of in person appointments. The patient expressed understanding and agreed to proceed.    I discussed the assessment and treatment plan with the patient. The patient was provided an opportunity to ask questions and all were answered. The patient agreed with the plan and demonstrated an understanding of the instructions.   The patient was advised to call back or seek an in-person evaluation if the symptoms worsen or if the condition fails to improve as anticipated.  I provided 26 minutes of non-face-to-face time during this encounter.   Judi Saa, DO  More detailed note following  CC: Neck pain follow-up  [id number]  Alicia [name] is a 53 y.o. female coming in with complaint of neck pain.  Known to have arthritic changes.  Has responded well to epidural.  Not taking any medications at the moment Patient denies any weakness overall.    Past Medical History:  Diagnosis Date  . Allergy   . Hypersomnia with sleep apnea, unspecified 05/23/2013  . OSA on CPAP 08/15/2013   Past Surgical History:  Procedure Laterality Date  . HYSTEROSCOPY W/D&C    . WISDOM TOOTH EXTRACTION     Social History   Socioeconomic History  . Marital status: Single    Spouse name: Not on file  . Number of children: 0  . Years of education: Post Grad  . Highest education level: Not on file  Occupational History    Comment: [employer]  Social Needs  . Financial resource strain: Not on file  . Food insecurity:    Worry: Not on file    Inability: Not on  file  . Transportation needs:    Medical: Not on file    Non-medical: Not on file  Tobacco Use  . Smoking status: Never Smoker  . Smokeless tobacco: Never Used  Substance and Sexual Activity  . Alcohol use: No  . Drug use: No  . Sexual activity: Never  Lifestyle  . Physical activity:    Days per week: Not on file    Minutes per session: Not on file  . Stress: Not on file  Relationships  . Social connections:    Talks on phone: Not on file    Gets together: Not on file    Attends religious service: Not on file    Active member of club or organization: Not on file    Attends meetings of clubs or organizations: Not on file    Relationship status: Not on file  Other Topics Concern  . Not on file  Social History Narrative   Patient lives at home alone.   Caffeine Use: occasionally soda   Allergies  Allergen Reactions  . Latex   . Other     Seasonal   . Lubricants    Family History  Problem Relation Age of Onset  . Diabetes Father        type 2  . Depression Father   . Dementia Father   . Narcolepsy Father        cataplexy  . High Cholesterol Father   . Stroke Maternal Grandmother   .  Heart disease Maternal Grandfather       Current Outpatient Medications (Respiratory):  .  fexofenadine (ALLEGRA) 180 MG tablet, Take 180 mg by mouth daily. .  Triamcinolone Acetonide (NASACORT ALLERGY 24HR) 55 MCG/ACT AERO, Place 2 sprays into the nose 2 (two) times daily.    Current Outpatient Medications (Other):  .  amphetamine-dextroamphetamine (ADDERALL) 10 MG tablet, Take 1 tablet (10 mg total) by mouth 2 (two) times daily with a meal. .  b complex vitamins capsule, Take 1 capsule by mouth daily. .  Bepotastine Besilate (BEPREVE) 1.5 % SOLN, Place 1 drop into both eyes once. .  betamethasone valerate lotion (VALISONE) 0.1 %, Apply 1 application topically daily. Marland Kitchen  desonide (DESOWEN) 0.05 % lotion, Apply 1 application topically 2 (two) times daily. .  Multiple Vitamin  (MULTIVITAMIN) tablet, Take 1 tablet by mouth daily. .  pimecrolimus (ELIDEL) 1 % cream, Apply 1 application topically 2 (two) times daily. .  Vitamin D, Ergocalciferol, (DRISDOL) 1.25 MG (50000 UT) CAPS capsule, Take 1 capsule (50,000 Units total) by mouth every 7 (seven) days.    Past medical history, social, surgical and family history all reviewed in electronic medical record.  No pertanent information unless stated regarding to the chief complaint.   Review of Systems:  No headache, visual changes, nausea, vomiting, diarrhea, constipation, dizziness, abdominal pain, skin rash, fevers, chills, night sweats, weight loss, swollen lymph nodes, body aches, joint swelling, muscle aches, chest pain, shortness of breath, mood changes.   Objective  Height 5' 5.5" (1.664 m), weight 198 lb (89.8 kg).    General: No apparent distress alert and oriented x3 mood and affect normal, dressed appropriately.  HEENT: Pupils equal, extraocular movements intact  Respiratory: Patient's speak in full sentences and does not appear short of breath  Patient is walking on video.    Impression and Recommendations:     . The above documentation has been reviewed and is accurate and complete Judi Saa, DO       Note: This dictation was prepared with Dragon dictation along with smaller phrase technology. Any transcriptional errors that result from this process are unintentional.

## 2019-01-21 ENCOUNTER — Ambulatory Visit (INDEPENDENT_AMBULATORY_CARE_PROVIDER_SITE_OTHER): Payer: BLUE CROSS/BLUE SHIELD | Admitting: Family Medicine

## 2019-01-21 ENCOUNTER — Encounter: Payer: Self-pay | Admitting: Family Medicine

## 2019-01-21 DIAGNOSIS — J301 Allergic rhinitis due to pollen: Secondary | ICD-10-CM | POA: Diagnosis not present

## 2019-01-21 DIAGNOSIS — J3089 Other allergic rhinitis: Secondary | ICD-10-CM | POA: Diagnosis not present

## 2019-01-21 DIAGNOSIS — J3081 Allergic rhinitis due to animal (cat) (dog) hair and dander: Secondary | ICD-10-CM | POA: Diagnosis not present

## 2019-01-21 DIAGNOSIS — M5030 Other cervical disc degeneration, unspecified cervical region: Secondary | ICD-10-CM | POA: Diagnosis not present

## 2019-01-21 NOTE — Assessment & Plan Note (Signed)
Degenerative disc disease.  Discussed posture and ergonomics.  Discussed which activities to do which was to avoid.  Discussed icing regimen and home exercise.  Which was to do which also avoid.  Follow-up again in 3 months

## 2019-01-21 NOTE — Patient Instructions (Addendum)
Good to see you on the phone  Iron 65mg  with 500mg  of vitamin c daily  Arnica lotion 2 times day for neck pain I am proud of you Lets check in again in 2-3 month

## 2019-01-22 ENCOUNTER — Ambulatory Visit: Payer: BLUE CROSS/BLUE SHIELD | Admitting: Family Medicine

## 2019-01-31 DIAGNOSIS — J301 Allergic rhinitis due to pollen: Secondary | ICD-10-CM | POA: Diagnosis not present

## 2019-01-31 DIAGNOSIS — J3081 Allergic rhinitis due to animal (cat) (dog) hair and dander: Secondary | ICD-10-CM | POA: Diagnosis not present

## 2019-01-31 DIAGNOSIS — J3089 Other allergic rhinitis: Secondary | ICD-10-CM | POA: Diagnosis not present

## 2019-02-01 DIAGNOSIS — G4733 Obstructive sleep apnea (adult) (pediatric): Secondary | ICD-10-CM | POA: Diagnosis not present

## 2019-02-07 DIAGNOSIS — J3089 Other allergic rhinitis: Secondary | ICD-10-CM | POA: Diagnosis not present

## 2019-02-07 DIAGNOSIS — J3081 Allergic rhinitis due to animal (cat) (dog) hair and dander: Secondary | ICD-10-CM | POA: Diagnosis not present

## 2019-02-07 DIAGNOSIS — J301 Allergic rhinitis due to pollen: Secondary | ICD-10-CM | POA: Diagnosis not present

## 2019-02-14 DIAGNOSIS — J301 Allergic rhinitis due to pollen: Secondary | ICD-10-CM | POA: Diagnosis not present

## 2019-02-14 DIAGNOSIS — J3081 Allergic rhinitis due to animal (cat) (dog) hair and dander: Secondary | ICD-10-CM | POA: Diagnosis not present

## 2019-02-14 DIAGNOSIS — J3089 Other allergic rhinitis: Secondary | ICD-10-CM | POA: Diagnosis not present

## 2019-02-19 ENCOUNTER — Encounter: Payer: Self-pay | Admitting: Family Medicine

## 2019-02-21 ENCOUNTER — Ambulatory Visit (INDEPENDENT_AMBULATORY_CARE_PROVIDER_SITE_OTHER): Payer: BLUE CROSS/BLUE SHIELD | Admitting: Family Medicine

## 2019-02-21 DIAGNOSIS — M5030 Other cervical disc degeneration, unspecified cervical region: Secondary | ICD-10-CM | POA: Diagnosis not present

## 2019-02-21 DIAGNOSIS — J3081 Allergic rhinitis due to animal (cat) (dog) hair and dander: Secondary | ICD-10-CM | POA: Diagnosis not present

## 2019-02-21 DIAGNOSIS — J301 Allergic rhinitis due to pollen: Secondary | ICD-10-CM | POA: Diagnosis not present

## 2019-02-21 DIAGNOSIS — J3089 Other allergic rhinitis: Secondary | ICD-10-CM | POA: Diagnosis not present

## 2019-02-21 MED ORDER — VENLAFAXINE HCL ER 37.5 MG PO CP24: 37.5000 mg | ORAL_CAPSULE | Freq: Every day | ORAL | 1 refills | Status: DC

## 2019-02-21 MED ORDER — GABAPENTIN 100 MG PO CAPS: 200.0000 mg | ORAL_CAPSULE | Freq: Every day | ORAL | 3 refills | Status: DC

## 2019-02-21 NOTE — Patient Instructions (Signed)
Good ot see ypoiu

## 2019-02-21 NOTE — Progress Notes (Signed)
[name] [address] [place], [place] [address] Phone: [phone] Subjective:   Virtual Visit via Video Note  I connected with Alicia [name] on 02/22/19 at 10:45 AM EDT by a video enabled telemedicine application and verified that I am speaking with the correct person using two identifiers.  Location: Patient: Was in her home setting Provider: And work setting   I discussed the limitations of evaluation and management by telemedicine and the availability of in person appointments. The patient expressed understanding and agreed to proceed.      I discussed the assessment and treatment plan with the patient. The patient was provided an opportunity to ask questions and all were answered. The patient agreed with the plan and demonstrated an understanding of the instructions.   The patient was advised to call back or seek an in-person evaluation if the symptoms worsen or if the condition fails to improve as anticipated.  I provided 11 minutes of non-face-to-face time during this encounter.   Judi Saa, DO      CC: Neck pain and arm pain follow-up  [id number]  Alicia [name] is a 53 y.o. female coming in with complaint of neck pain.  Patient has been seen multiple times.  Does have degenerative disc disease of the neck.  Also has known carpal tunnel syndrome.  Patient did respond somewhat to cervical epidural.  Patient is having worsening pain at the moment.  Sleeping with her arm above her head.  Had been noncompliant with the gabapentin and the Effexor but would like to start it again.     Past Medical History:  Diagnosis Date  . Allergy   . Hypersomnia with sleep apnea, unspecified 05/23/2013  . OSA on CPAP 08/15/2013   Past Surgical History:  Procedure Laterality Date  . HYSTEROSCOPY W/D&C    . WISDOM TOOTH EXTRACTION     Social History   Socioeconomic History  . Marital status: Single    Spouse name: Not on file   . Number of children: 0  . Years of education: Post Grad  . Highest education level: Not on file  Occupational History    Comment: [employer]  Social Needs  . Financial resource strain: Not on file  . Food insecurity:    Worry: Not on file    Inability: Not on file  . Transportation needs:    Medical: Not on file    Non-medical: Not on file  Tobacco Use  . Smoking status: Never Smoker  . Smokeless tobacco: Never Used  Substance and Sexual Activity  . Alcohol use: No  . Drug use: No  . Sexual activity: Never  Lifestyle  . Physical activity:    Days per week: Not on file    Minutes per session: Not on file  . Stress: Not on file  Relationships  . Social connections:    Talks on phone: Not on file    Gets together: Not on file    Attends religious service: Not on file    Active member of club or organization: Not on file    Attends meetings of clubs or organizations: Not on file    Relationship status: Not on file  Other Topics Concern  . Not on file  Social History Narrative   Patient lives at home alone.   Caffeine Use: occasionally soda   Allergies  Allergen Reactions  . Latex   . Other     Seasonal   . Lubricants  Family History  Problem Relation Age of Onset  . Diabetes Father        type 2  . Depression Father   . Dementia Father   . Narcolepsy Father        cataplexy  . High Cholesterol Father   . Stroke Maternal Grandmother   . Heart disease Maternal Grandfather       Current Outpatient Medications (Respiratory):  .  fexofenadine (ALLEGRA) 180 MG tablet, Take 180 mg by mouth daily. .  Triamcinolone Acetonide (NASACORT ALLERGY 24HR) 55 MCG/ACT AERO, Place 2 sprays into the nose 2 (two) times daily.    Current Outpatient Medications (Other):  .  amphetamine-dextroamphetamine (ADDERALL) 10 MG tablet, Take 1 tablet (10 mg total) by mouth 2 (two) times daily with a meal. .  b complex vitamins capsule, Take 1 capsule by mouth daily. .   Bepotastine Besilate (BEPREVE) 1.5 % SOLN, Place 1 drop into both eyes once. .  betamethasone valerate lotion (VALISONE) 0.1 %, Apply 1 application topically daily. Marland Kitchen.  desonide (DESOWEN) 0.05 % lotion, Apply 1 application topically 2 (two) times daily. Marland Kitchen.  gabapentin (NEURONTIN) 100 MG capsule, Take 2 capsules (200 mg total) by mouth at bedtime. .  Multiple Vitamin (MULTIVITAMIN) tablet, Take 1 tablet by mouth daily. .  pimecrolimus (ELIDEL) 1 % cream, Apply 1 application topically 2 (two) times daily. Marland Kitchen.  venlafaxine XR (EFFEXOR XR) 37.5 MG 24 hr capsule, Take 1 capsule (37.5 mg total) by mouth daily with breakfast. .  Vitamin D, Ergocalciferol, (DRISDOL) 1.25 MG (50000 UT) CAPS capsule, Take 1 capsule (50,000 Units total) by mouth every 7 (seven) days.    Past medical history, social, surgical and family history all reviewed in electronic medical record.  No pertanent information unless stated regarding to the chief complaint.   Review of Systems:  No headache, visual changes, nausea, vomiting, diarrhea, constipation, dizziness, abdominal pain, skin rash, fevers, chills, night sweats, weight loss, swollen lymph nodes, body aches, joint swelling,  chest pain, shortness of breath, mood changes.  Positive muscle aches  Objective    General: No apparent distress alert and oriented x3 mood and affect normal, dressed appropriately.  Patient shows sign of keeping her arm above her head    Impression and Recommendations:     This case required medical decision making of moderate complexity. The above documentation has been reviewed and is accurate and complete Judi SaaZachary M Smith, DO       Note: This dictation was prepared with Dragon dictation along with smaller phrase technology. Any transcriptional errors that result from this process are unintentional.

## 2019-02-22 ENCOUNTER — Encounter: Payer: Self-pay | Admitting: Family Medicine

## 2019-02-22 NOTE — Assessment & Plan Note (Signed)
I am concerned that patient is having more neck pain.  Does talk about more of the radiation down the arm.  Encourage her to start the gabapentin and the Effexor and refills given today.  In addition of this differential includes a carpal tunnel syndrome as well as has had trigger points in the neck.  Patient's pain is worsening and do not feel comfortable sending pain medications to increase patient without truly doing a thorough physical evaluation.  Patient will come back and see me again in 3 weeks if any intervention necessary we will do it in the office.

## 2019-02-24 ENCOUNTER — Encounter: Payer: Self-pay | Admitting: Family Medicine

## 2019-02-26 ENCOUNTER — Encounter: Payer: Self-pay | Admitting: Family Medicine

## 2019-02-26 ENCOUNTER — Other Ambulatory Visit: Payer: Self-pay

## 2019-02-26 ENCOUNTER — Ambulatory Visit (INDEPENDENT_AMBULATORY_CARE_PROVIDER_SITE_OTHER): Payer: BLUE CROSS/BLUE SHIELD | Admitting: Family Medicine

## 2019-02-26 DIAGNOSIS — M5030 Other cervical disc degeneration, unspecified cervical region: Secondary | ICD-10-CM

## 2019-02-26 DIAGNOSIS — M25512 Pain in left shoulder: Secondary | ICD-10-CM | POA: Diagnosis not present

## 2019-02-26 DIAGNOSIS — G5602 Carpal tunnel syndrome, left upper limb: Secondary | ICD-10-CM

## 2019-02-26 NOTE — Assessment & Plan Note (Signed)
Left carpal tunnel pain. Injection today.  I do not think that this is.  Hopefully this will help..  4 to 8 weeks we will do virtual in 2 weeks

## 2019-02-26 NOTE — Assessment & Plan Note (Signed)
Repeat today. Discussed side effects, HEP, discussed posture and ergonomics.  RTC in 4 weeks

## 2019-02-26 NOTE — Assessment & Plan Note (Signed)
Has responded well to right epidurals previously.  Patient independent when ordered but secondary to the coronavirus omentum.  Patient will be set up at the earliest possible date.  The 2 weeks for further evaluation.

## 2019-02-26 NOTE — Progress Notes (Signed)
Alicia [name] D.O. [facility] [address] [place], [place] [address] PhoneTawana Scale: [phone] Subjective:   Alicia [name], Alicia [name], am serving as a scribe for Dr. Antoine PrimasZachary [name].  I'm seeing this patient by the request  of:    CC: Neck pain follow-up YQM:[id number]:Subjective  Alicia [name] is a 53 y.o. female coming in with complaint of shoulder and neck pain. Known history of DDD. Pain is going down her back but originates in her neck. Pain increases throughout the morning. Has been using gabapentin initially. Also continues to use Effexor.       Past Medical History:  Diagnosis Date  . Allergy   . Hypersomnia with sleep apnea, unspecified 05/23/2013  . OSA on CPAP 08/15/2013   Past Surgical History:  Procedure Laterality Date  . HYSTEROSCOPY W/D&C    . WISDOM TOOTH EXTRACTION     Social History   Socioeconomic History  . Marital status: Single    Spouse name: Not on file  . Number of children: 0  . Years of education: Post Grad  . Highest education level: Not on file  Occupational History    Comment: [employer]  Social Needs  . Financial resource strain: Not on file  . Food insecurity:    Worry: Not on file    Inability: Not on file  . Transportation needs:    Medical: Not on file    Non-medical: Not on file  Tobacco Use  . Smoking status: Never Smoker  . Smokeless tobacco: Never Used  Substance and Sexual Activity  . Alcohol use: No  . Drug use: No  . Sexual activity: Never  Lifestyle  . Physical activity:    Days per week: Not on file    Minutes per session: Not on file  . Stress: Not on file  Relationships  . Social connections:    Talks on phone: Not on file    Gets together: Not on file    Attends religious service: Not on file    Active member of club or organization: Not on file    Attends meetings of clubs or organizations: Not on file    Relationship status: Not on file  Other Topics Concern  . Not on file  Social History Narrative   Patient lives at home alone.   Caffeine Use: occasionally soda   Allergies  Allergen Reactions  . Latex   . Other     Seasonal   . Lubricants    Family History  Problem Relation Age of Onset  . Diabetes Father        type 2  . Depression Father   . Dementia Father   . Narcolepsy Father        cataplexy  . High Cholesterol Father   . Stroke Maternal Grandmother   . Heart disease Maternal Grandfather       Current Outpatient Medications (Respiratory):  .  fexofenadine (ALLEGRA) 180 MG tablet, Take 180 mg by mouth daily. .  Triamcinolone Acetonide (NASACORT ALLERGY 24HR) 55 MCG/ACT AERO, Place 2 sprays into the nose 2 (two) times daily.    Current Outpatient Medications (Other):  .  amphetamine-dextroamphetamine (ADDERALL) 10 MG tablet, Take 1 tablet (10 mg total) by mouth 2 (two) times daily with a meal. .  b complex vitamins capsule, Take 1 capsule by mouth daily. .  Bepotastine Besilate (BEPREVE) 1.5 % SOLN, Place 1 drop into both eyes once. .  betamethasone valerate lotion (VALISONE) 0.1 %, Apply 1 application topically  daily. .  desonide (DESOWEN) 0.05 % lotion, Apply 1 application topically 2 (two) times daily. Marland Kitchen  gabapentin (NEURONTIN) 100 MG capsule, Take 2 capsules (200 mg total) by mouth at bedtime. .  Multiple Vitamin (MULTIVITAMIN) tablet, Take 1 tablet by mouth daily. .  pimecrolimus (ELIDEL) 1 % cream, Apply 1 application topically 2 (two) times daily. Marland Kitchen  venlafaxine XR (EFFEXOR XR) 37.5 MG 24 hr capsule, Take 1 capsule (37.5 mg total) by mouth daily with breakfast. .  Vitamin D, Ergocalciferol, (DRISDOL) 1.25 MG (50000 UT) CAPS capsule, Take 1 capsule (50,000 Units total) by mouth every 7 (seven) days.    Past medical history, social, surgical and family history all reviewed in electronic medical record.  No pertanent information unless stated regarding to the chief complaint.   Review of Systems:  No headache, visual changes, nausea, vomiting, diarrhea,  constipation, dizziness, abdominal pain, skin rash, fevers, chills, night sweats, weight loss, swollen lymph nodes, body aches, joint swelling, muscle aches, chest pain, shortness of breath, mood changes.   Objective  Blood pressure 110/60, pulse 69, height 5' 5.5" (1.664 m), weight 200 lb (90.7 kg), SpO2 98 %.    General: No apparent distress alert and oriented x3 mood and affect normal, dressed appropriately.  HEENT: Pupils equal, extraocular movements intact  Respiratory: Patient's speak in full sentences and does not appear short of breath  Cardiovascular: No lower extremity edema, non tender, no erythema  Skin: Warm dry intact with no signs of infection or rash on extremities or on axial skeleton.  Abdomen: Soft nontender  Neuro: Cranial nerves II through XII are intact, neurovascularly intact in all extremities with 2+ DTRs and 2+ pulses.  Lymph: No lymphadenopathy of posterior or anterior cervical chain or axillae bilaterally.  Gait normal with good balance and coordination.  MSK:  Non tender with full range of motion and good stability and symmetric strength and tone of shoulders, elbows, wrist, hip, knee and ankles bilaterally.  Neck: Inspection . No palpable stepoffs. Positive Spurling's maneuver. Limited range of motion in all planes especially with left-sided patient sidebending Grip strength mildly decreased.  Patient does have positive Tinel's Negative Hoffman sign bilaterally Reflexes normal Significant trigger points noted in the left shoulder blade  Left wrist exam shows a positive Tinel's.  Mild weakness in grip strength.  Procedure: Real-time Ultrasound Guided Injection of right carpal tunnel Device: GE Logiq Q7 Ultrasound guided injection is preferred based studies that show increased duration, increased effect, greater accuracy, decreased procedural pain, increased response rate with ultrasound guided versus blind injection.  Verbal informed consent obtained.   Time-out conducted.  Noted no overlying erythema, induration, or other signs of local infection.  Skin prepped in a sterile fashion.  Local anesthesia: Topical Ethyl chloride.  With sterile technique and under real time ultrasound guidance:  median nerve visualized.  23g 5/8 inch needle inserted distal to proximal approach into nerve sheath. Pictures taken nfor needle placement. Patient did have injection of 2 cc of 0.5% Marcaine, and 1 cc of Kenalog 40 mg/dL. Completed without difficulty  Pain immediately resolved suggesting accurate placement of the medication.  Advised to call if fevers/chills, erythema, induration, drainage, or persistent bleeding.  Images permanently stored and available for review in the ultrasound unit.  Impression: Technically successful ultrasound guided injection.  After verbal consent patient was prepped with alcohol swabs and with an 85-gauge half inch needle injected with 5 cc of 0.5% Marcaine and 0.5 cc times per mL normalized.  Postinjection instructions given.  Patient able to a deep breath.  Please with 3 specific trigger points in the left trapezius area.   Impression and Recommendations:     This case required medical decision making of moderate complexity. The above documentation has been reviewed and is accurate and complete Alicia Saa, DO Scheduled      Note: This dictation was prepared with Dragon dictation along with smaller phrase technology. Any transcriptional errors that result from this process are unintentional.

## 2019-02-27 DIAGNOSIS — J301 Allergic rhinitis due to pollen: Secondary | ICD-10-CM | POA: Diagnosis not present

## 2019-02-28 DIAGNOSIS — J3089 Other allergic rhinitis: Secondary | ICD-10-CM | POA: Diagnosis not present

## 2019-02-28 DIAGNOSIS — J3081 Allergic rhinitis due to animal (cat) (dog) hair and dander: Secondary | ICD-10-CM | POA: Diagnosis not present

## 2019-02-28 DIAGNOSIS — J301 Allergic rhinitis due to pollen: Secondary | ICD-10-CM | POA: Diagnosis not present

## 2019-03-01 DIAGNOSIS — J301 Allergic rhinitis due to pollen: Secondary | ICD-10-CM | POA: Diagnosis not present

## 2019-03-01 DIAGNOSIS — J3089 Other allergic rhinitis: Secondary | ICD-10-CM | POA: Diagnosis not present

## 2019-03-01 DIAGNOSIS — J3081 Allergic rhinitis due to animal (cat) (dog) hair and dander: Secondary | ICD-10-CM | POA: Diagnosis not present

## 2019-03-07 ENCOUNTER — Ambulatory Visit: Payer: BLUE CROSS/BLUE SHIELD | Admitting: Family Medicine

## 2019-03-08 ENCOUNTER — Encounter: Payer: Self-pay | Admitting: Family Medicine

## 2019-03-08 DIAGNOSIS — M5030 Other cervical disc degeneration, unspecified cervical region: Secondary | ICD-10-CM

## 2019-03-08 DIAGNOSIS — J301 Allergic rhinitis due to pollen: Secondary | ICD-10-CM | POA: Diagnosis not present

## 2019-03-08 DIAGNOSIS — J3081 Allergic rhinitis due to animal (cat) (dog) hair and dander: Secondary | ICD-10-CM | POA: Diagnosis not present

## 2019-03-08 DIAGNOSIS — J3089 Other allergic rhinitis: Secondary | ICD-10-CM | POA: Diagnosis not present

## 2019-03-12 ENCOUNTER — Encounter: Payer: Self-pay | Admitting: Family Medicine

## 2019-03-12 ENCOUNTER — Ambulatory Visit (INDEPENDENT_AMBULATORY_CARE_PROVIDER_SITE_OTHER): Payer: BLUE CROSS/BLUE SHIELD | Admitting: Family Medicine

## 2019-03-12 DIAGNOSIS — M5030 Other cervical disc degeneration, unspecified cervical region: Secondary | ICD-10-CM | POA: Diagnosis not present

## 2019-03-12 MED ORDER — METHOCARBAMOL 500 MG PO TABS: 500.0000 mg | ORAL_TABLET | Freq: Three times a day (TID) | ORAL | 0 refills | Status: DC

## 2019-03-12 MED ORDER — MELOXICAM 15 MG PO TABS: 15.0000 mg | ORAL_TABLET | Freq: Every day | ORAL | 0 refills | Status: DC

## 2019-03-12 NOTE — Progress Notes (Signed)
Alicia ScaleZach [name] D.O. [facility] [address] [place], [place] [address] Phone: [phone] Subjective:    Virtual Visit via Video Note  I connected with Alicia [name] on 03/12/19 at  9:30 AM EDT by a video enabled telemedicine application and verified that I am speaking with the correct person using two identifiers.  Location: Patient: in home   Provider: in my office setting    I discussed the limitations of evaluation and management by telemedicine and the availability of in person appointments. The patient expressed understanding and agreed to proceed.    I discussed the assessment and treatment plan with the patient. The patient was provided an opportunity to ask questions and all were answered. The patient agreed with the plan and demonstrated an understanding of the instructions.   The patient was advised to call back or seek an in-person evaluation if the symptoms worsen or if the condition fails to improve as anticipated.  I provided 27 minutes of non-face-to-face time during this encounter.   Alicia SaaZachary M Smith, DO    CC: neck pain   NUU:[id number]:Subjective  Alicia Dropfrique Tanna FurryKilimanjaro is a 53 y.o. female coming in with complaint of  Severe arthritic changes.  Patient continues to have radicular symptoms.  Rates the severity of pain is 9 out of 10 patient is awaiting an epidural of the cervical neck.     Past Medical History:  Diagnosis Date  . Allergy   . Hypersomnia with sleep apnea, unspecified 05/23/2013  . OSA on CPAP 08/15/2013   Past Surgical History:  Procedure Laterality Date  . HYSTEROSCOPY W/D&C    . WISDOM TOOTH EXTRACTION     Social History   Socioeconomic History  . Marital status: Single    Spouse name: Not on file  . Number of children: 0  . Years of education: Post Grad  . Highest education level: Not on file  Occupational History    Comment: [employer]  Social Needs  . Financial resource strain: Not on file  . Food insecurity:     Worry: Not on file    Inability: Not on file  . Transportation needs:    Medical: Not on file    Non-medical: Not on file  Tobacco Use  . Smoking status: Never Smoker  . Smokeless tobacco: Never Used  Substance and Sexual Activity  . Alcohol use: No  . Drug use: No  . Sexual activity: Never  Lifestyle  . Physical activity:    Days per week: Not on file    Minutes per session: Not on file  . Stress: Not on file  Relationships  . Social connections:    Talks on phone: Not on file    Gets together: Not on file    Attends religious service: Not on file    Active member of club or organization: Not on file    Attends meetings of clubs or organizations: Not on file    Relationship status: Not on file  Other Topics Concern  . Not on file  Social History Narrative   Patient lives at home alone.   Caffeine Use: occasionally soda   Allergies  Allergen Reactions  . Latex   . Other     Seasonal   . Lubricants    Family History  Problem Relation Age of Onset  . Diabetes Father        type 2  . Depression Father   . Dementia Father   . Narcolepsy Father  cataplexy  . High Cholesterol Father   . Stroke Maternal Grandmother   . Heart disease Maternal Grandfather       Current Outpatient Medications (Respiratory):  .  fexofenadine (ALLEGRA) 180 MG tablet, Take 180 mg by mouth daily. .  Triamcinolone Acetonide (NASACORT ALLERGY 24HR) 55 MCG/ACT AERO, Place 2 sprays into the nose 2 (two) times daily.  Current Outpatient Medications (Analgesics):  .  meloxicam (MOBIC) 15 MG tablet, Take 1 tablet (15 mg total) by mouth daily.   Current Outpatient Medications (Other):  .  amphetamine-dextroamphetamine (ADDERALL) 10 MG tablet, Take 1 tablet (10 mg total) by mouth 2 (two) times daily with a meal. .  b complex vitamins capsule, Take 1 capsule by mouth daily. .  Bepotastine Besilate (BEPREVE) 1.5 % SOLN, Place 1 drop into both eyes once. .  betamethasone valerate  lotion (VALISONE) 0.1 %, Apply 1 application topically daily. Marland Kitchen  desonide (DESOWEN) 0.05 % lotion, Apply 1 application topically 2 (two) times daily. Marland Kitchen  gabapentin (NEURONTIN) 100 MG capsule, Take 2 capsules (200 mg total) by mouth at bedtime. .  methocarbamol (ROBAXIN) 500 MG tablet, Take 1 tablet (500 mg total) by mouth 3 (three) times daily. .  Multiple Vitamin (MULTIVITAMIN) tablet, Take 1 tablet by mouth daily. .  pimecrolimus (ELIDEL) 1 % cream, Apply 1 application topically 2 (two) times daily. Marland Kitchen  venlafaxine XR (EFFEXOR XR) 37.5 MG 24 hr capsule, Take 1 capsule (37.5 mg total) by mouth daily with breakfast. .  Vitamin D, Ergocalciferol, (DRISDOL) 1.25 MG (50000 UT) CAPS capsule, Take 1 capsule (50,000 Units total) by mouth every 7 (seven) days.    Past medical history, social, surgical and family history all reviewed in electronic medical record.  No pertanent information unless stated regarding to the chief complaint.   Review of Systems:  No headache, visual changes, nausea, vomiting, diarrhea, constipation, dizziness, abdominal pain, skin rash, fevers, chills, night sweats, weight loss, swollen lymph nodes, body aches, joint swelling, , chest pain, shortness of breath, mood changes. Muscle aches   Objective     General: No apparent distress alert and oriented x3 mood and affect normal, dressed appropriately.      Impression and Recommendations:     This case required medical decision making of moderate complexity. The above documentation has been reviewed and is accurate and complete Alicia Saa, DO       Note: This dictation was prepared with Dragon dictation along with smaller phrase technology. Any transcriptional errors that result from this process are unintentional.

## 2019-03-12 NOTE — Assessment & Plan Note (Signed)
Arthritic changes in the neck.  Patient still wants to avoid any surgical intervention.  Discussed with her at great length.  Will need an epidural.  Discussed which activities of doing which also avoid.  Patient will increase activity slowly over the course the neck several days.  Follow-up again 4 to 8 weeks

## 2019-03-15 ENCOUNTER — Encounter: Payer: Self-pay | Admitting: Family Medicine

## 2019-03-15 DIAGNOSIS — J301 Allergic rhinitis due to pollen: Secondary | ICD-10-CM | POA: Diagnosis not present

## 2019-03-15 DIAGNOSIS — J3081 Allergic rhinitis due to animal (cat) (dog) hair and dander: Secondary | ICD-10-CM | POA: Diagnosis not present

## 2019-03-15 DIAGNOSIS — J3089 Other allergic rhinitis: Secondary | ICD-10-CM | POA: Diagnosis not present

## 2019-03-16 ENCOUNTER — Other Ambulatory Visit: Payer: Self-pay | Admitting: Family Medicine

## 2019-03-19 ENCOUNTER — Telehealth: Payer: Self-pay | Admitting: Neurology

## 2019-03-19 NOTE — Telephone Encounter (Signed)
Due to current COVID 19 pandemic, our office is severely reducing in office visits until further notice, in order to minimize the risk to our patients and healthcare providers.  ° °Called patient and confirmed a virtual visit for her 6/1 appointment. Patient verbalized understanding of the doxy.me process and I have sent her an e-mail with link and directions as well as my name and office number/hours for reference. Patient understands that she will receive a call from RN to update chart. ° °Pt understands that although there may be some limitations with this type of visit, we will take all precautions to reduce any security or privacy concerns.  Pt understands that this will be treated like an in office visit and we will file with pt's insurance, and there may be a patient responsible charge related to this service. ° °

## 2019-03-20 ENCOUNTER — Encounter: Payer: Self-pay | Admitting: Neurology

## 2019-03-20 ENCOUNTER — Encounter: Payer: Self-pay | Admitting: Family Medicine

## 2019-03-20 MED ORDER — TRAMADOL HCL 50 MG PO TABS: 50.0000 mg | ORAL_TABLET | Freq: Two times a day (BID) | ORAL | 0 refills | Status: AC | PRN

## 2019-03-21 ENCOUNTER — Encounter: Payer: Self-pay | Admitting: Family Medicine

## 2019-03-21 NOTE — Telephone Encounter (Signed)
I called pt to update her chart. No answer, left a message asking her to call me back. 

## 2019-03-25 ENCOUNTER — Encounter: Payer: Self-pay | Admitting: Neurology

## 2019-03-25 ENCOUNTER — Other Ambulatory Visit: Payer: Self-pay

## 2019-03-25 ENCOUNTER — Ambulatory Visit (INDEPENDENT_AMBULATORY_CARE_PROVIDER_SITE_OTHER): Payer: BLUE CROSS/BLUE SHIELD | Admitting: Neurology

## 2019-03-25 DIAGNOSIS — G4730 Sleep apnea, unspecified: Secondary | ICD-10-CM

## 2019-03-25 DIAGNOSIS — G4710 Hypersomnia, unspecified: Secondary | ICD-10-CM

## 2019-03-25 DIAGNOSIS — G4733 Obstructive sleep apnea (adult) (pediatric): Secondary | ICD-10-CM | POA: Diagnosis not present

## 2019-03-25 DIAGNOSIS — Z9989 Dependence on other enabling machines and devices: Secondary | ICD-10-CM | POA: Diagnosis not present

## 2019-03-25 DIAGNOSIS — J301 Allergic rhinitis due to pollen: Secondary | ICD-10-CM | POA: Diagnosis not present

## 2019-03-25 DIAGNOSIS — J3081 Allergic rhinitis due to animal (cat) (dog) hair and dander: Secondary | ICD-10-CM | POA: Diagnosis not present

## 2019-03-25 DIAGNOSIS — J3089 Other allergic rhinitis: Secondary | ICD-10-CM | POA: Diagnosis not present

## 2019-03-25 MED ORDER — AMPHETAMINE-DEXTROAMPHETAMINE 10 MG PO TABS: 10.0000 mg | ORAL_TABLET | Freq: Two times a day (BID) | ORAL | 0 refills | Status: DC

## 2019-03-25 NOTE — Progress Notes (Signed)
Interim history:  Alicia [name] is a very pleasant 53 year old right-handed woman with an underlying medical history of allergies, hyperlipidemia, and obstructive sleep apnea on CPAP with residual daytime somnolence, who presents for a virtual, video based appointment via [email or web address] for follow-up consultation of her sleep apnea and hypersomnolence d/o. The patient is unaccompanied today and joins via cell ph from her parked car, I am located in my office.  I last saw her on 07/17/2018, at which time she was compliant with her CPAP.  She was taking Adderall 20 mg strength generic half a pill to 1 pill daily on average.  We mutually agreed to change her prescription to 10 mg strength so she would not have to break the 20 mg in half.  Today, 03/25/2019: Please also see below for virtual visit documentation.  I reviewed her CPAP compliance data from 02/19/2019 through 03/20/2019 which is a total of 30 days, during which time she used her machine every night with percent used days greater than 4 hours at 100%, indicating superb compliance with an average usage of 8 hours and 3 minutes which is very good, residual AHI slightly elevated at 7.8/h, leak on the low end, with a 95th percentile at 6.6 L/min on a pressure of 11 cm with EPR of 2   The patient's allergies, current medications, family history, past medical history, past social history, past surgical history and problem list were reviewed and updated as appropriate.    Previously (copied from previous notes for reference):    I saw her on 01/11/2018, at which time she was fully compliant with her CPAP. She was doing quite well on her stimulant but was not always taking her second dose. She had significant neck pain and had been to physical therapy, had injection under neurosurgery as well. I suggested we maintain her low dose Adderall generic 10 mg twice a day.   I reviewed her CPAP compliance data from 06/12/2018 through 07/11/2018 which is a total of 30  days, during which time she used her CPAP 29 days with percent used days greater than 4 hours at 90%, indicating excellent compliance with an average usage of 6 hours and 57 minutes, residual AHI borderline at 4.9 per hour, leak acceptable with the 95th percentile at 8.6 L/m on a pressure of 11 cm with EPR of 2.   I reviewed her CPAP compliance data from 12/09/2017 through 01/07/2018 which is a total of 30 days, during which time she used her CPAP every night with percent used days greater than 4 hours at 100%, indicating superb compliance with an average usage of 7 hours and 14 minutes, residual AHI borderline at 5.9 per hour, leaked low with the 95th percentile at 4.4 L/m on a pressure of 10 cm with EPR of 2.   I saw her on 07/13/2017, at which time she was compliant with CPAP. She had issues with neck pain and went through physical therapy with dry needling. She was supposed to see orthopedics She was doing reasonably well on her Adderall and was compliant with CPAP therapy. I suggested a six-month follow-up.    I saw her on 01/05/2017, at which time she was compliant with her CPAP. She had occasional difficulty maintaining sleep at night, was attributing this to taking the Adderall. She was trying to be careful not to take it too late after lunch. I suggested we continue Adderall immediate release 10 mg twice a day.    I reviewed her CPAP compliance data  from 06/11/2017 through 07/10/2017 which is a total of 30 days, during which time she used her CPAP every night with percent used days greater than 4 hours at 100%, indicating superb compliance with an average usage of 7 hours and 11 minutes, residual AHI borderline at 6.4 per hour, leak acceptable with the 95th percentile at 12.6 L/m on a pressure of 10 cm with EPR of 2.    I reviewed her CPAP compliance data from 12/06/2016 through 01/04/2017 which is a total of 30 days, during which time she used her CPAP every night with percent used days greater  than 4 hours at 100%, indicating superb compliance with an average usage of 6 hours and 29 minutes, residual AHI at goal at 3.2 per hour, leak low with the 95th percentile at 3.1 L/m on a pressure of 10 cm with EPR of 2.     I saw her on 07/07/2016 after a gap of nearly 2 years, at which time she reported doing okay, weight had been fluctuating, but was going to the gym on a fairly regular basis. She was fully compliant with CPAP, still had sleepiness during the day especially in the afternoons, particularly after eating a carb-laden meal. Other than that, she was stable, taking a vitamin D supplement and other supplements, vitamin D was low per her report and she was told to start taking over-the-counter vit D per PCP. She had not tried the Adderall that I suggested at the last visit and had stopped taking the Nuvigil even though it worked okay because of was too expensive. I suggested starting her on Adderall immediate release 10 mg once daily in the early afternoon. She emailed in the interim in November 2017, reporting being a little bit more focused during the day and not overeating as much but as far as daytime somnolence she did not notice any telltale was palms. I suggested we increase the Adderall to 10 mg twice daily, second dose no later than 3 PM to avoid nighttime insomnia.   I saw her on 08/21/2014, at which time she reported doing well on Nuvigil 150 mg once daily and she felt that it was better than the Provigil generic. She had some residual sleepiness but felt improved overall. She was compliant with CPAP therapy. I suggested we try low-dose Adderall as needed for residual sleepiness.   I reviewed her CPAP compliance data from 06/07/2016 through 07/06/2016 which is a total of 30 days, during which time she used her machine every night with percent used days greater than 4 hours at 100%, indicating superb compliance with an average usage of 7 hours and 4 minutes, residual AHI 4.1 per hour,  leak low for the 95th percentile at 2.6 L/m on a pressure of 10 cm with EPR of 2.   I saw her on 02/13/2014, at which time she continued to endorse daytime somnolence. She had just been approved for Nuvigil but had not started it yet. She was fully compliant with CPAP therapy.   I reviewed her compliance data from the compliance card on her machine: 07/22/2014 through 08/20/2014 which is a total of 30 days during which time she used her machine every night with percent used days greater than 4 hours of 97%, indicating excellent compliance, pressure still at 10 cm with EPR of 2. Residual AHI acceptable at 4.9 per hour and leak generally low with the 95th percentile of leak at 6.2 L/m, average usage of 7 hours and 19 minutes. She is  compliant with her CPAP machine, she has a F[employer]P Pill[employer] nasal pillows. She has exacerbation of her allergies and also facial eczema. She has recently seen a dermatologist and was given a new ointment.   I saw her on 08/15/2013, at which time we talked about her sleep test results from 2013 and her CPAP compliance which was excellent. She has a family history of narcolepsy with cataplexy in her father. She felt Provigil as needed was helpful. She had trouble remembering the second dose of Provigil and therefore I suggested a trial of Nuvigil once daily. She was approved for this.    I first met her on 05/23/2013, at which time she reported being veyr sleepy despite being compliant with CPAP therapy. I suggested that she try Provigil to help stay awake during the day a little better, starting with 200 mg strength half a pill up to twice daily as needed. I advised her not to take it after 3 PM to avoid insomnia at night.  She has a FHx of narcolepsy with cataplexy in her father and was particularly concerned that she may have narcolepsy.    I had reviewed records including a recent CPAP compliance download from 04/02/2013 through 05/22/2013, total of 51 days, during which time she  used CPAP every day. Percent used days greater than 4 hours was 96% indicating excellent compliance. Her CPAP pressure is 10 cm water pressure with an EPR of 2. Residual AHI is 4.7 indicating an appropriate pressure level. Average usage was 6:41 hours.    I reviewed a sleep study report from 11/10/2011. This was a split-night study. REM percentage was 17% and the baseline portion of the study. Her AHI was 61.9 per hour with an oxyhemoglobin desaturation nadir of 75%. She was titrated on CPAP from 4-10 cm of water pressure with elimination of her sleep disordered breathing reported.    She uses a nasal pillows mask. I also reviewed older compliance data from August to Nov. 2013, during which time she was very compliant but had a high leak. At the time, she was using a FFM and leak was much better with the new mask.   Her Past Medical History Is Significant For: Past Medical History:  Diagnosis Date  . Allergy   . Hypersomnia with sleep apnea, unspecified 05/23/2013  . OSA on CPAP 08/15/2013    Her Past Surgical History Is Significant For: Past Surgical History:  Procedure Laterality Date  . HYSTEROSCOPY W/D&C    . WISDOM TOOTH EXTRACTION      Her Family History Is Significant For: Family History  Problem Relation Age of Onset  . Diabetes Father        type 2  . Depression Father   . Dementia Father   . Narcolepsy Father        cataplexy  . High Cholesterol Father   . Stroke Maternal Grandmother   . Heart disease Maternal Grandfather     Her Social History Is Significant For: Social History   Socioeconomic History  . Marital status: Single    Spouse name: Not on file  . Number of children: 0  . Years of education: Post Grad  . Highest education level: Not on file  Occupational History    Comment: Ware  . Financial resource strain: Not on file  . Food insecurity:    Worry: Not on file    Inability: Not on file  . Transportation needs:     Medical: Not  on file    Non-medical: Not on file  Tobacco Use  . Smoking status: Never Smoker  . Smokeless tobacco: Never Used  Substance and Sexual Activity  . Alcohol use: No  . Drug use: No  . Sexual activity: Never  Lifestyle  . Physical activity:    Days per week: Not on file    Minutes per session: Not on file  . Stress: Not on file  Relationships  . Social connections:    Talks on phone: Not on file    Gets together: Not on file    Attends religious service: Not on file    Active member of club or organization: Not on file    Attends meetings of clubs or organizations: Not on file    Relationship status: Not on file  Other Topics Concern  . Not on file  Social History Narrative   Patient lives at home alone.   Caffeine Use: occasionally soda    Her Allergies Are:  Allergies  Allergen Reactions  . Latex   . Other     Seasonal   . Lubricants   :   Her Current Medications Are:  Outpatient Encounter Medications as of 03/25/2019  Medication Sig  . amphetamine-dextroamphetamine (ADDERALL) 10 MG tablet Take 1 tablet (10 mg total) by mouth 2 (two) times daily with a meal.  . b complex vitamins capsule Take 1 capsule by mouth daily.  . Bepotastine Besilate (BEPREVE) 1.5 % SOLN Place 1 drop into both eyes once.  . betamethasone valerate lotion (VALISONE) 0.1 % Apply 1 application topically daily.  Marland Kitchen desonide (DESOWEN) 0.05 % lotion Apply 1 application topically 2 (two) times daily.  . fexofenadine (ALLEGRA) 180 MG tablet Take 180 mg by mouth daily.  Marland Kitchen gabapentin (NEURONTIN) 100 MG capsule TAKE 2 CAPSULES (200 MG TOTAL) BY MOUTH AT BEDTIME.  . meloxicam (MOBIC) 15 MG tablet Take 1 tablet (15 mg total) by mouth daily.  . methocarbamol (ROBAXIN) 500 MG tablet Take 1 tablet (500 mg total) by mouth 3 (three) times daily.  . Multiple Vitamin (MULTIVITAMIN) tablet Take 1 tablet by mouth daily.  . pimecrolimus (ELIDEL) 1 % cream Apply 1 application topically 2 (two) times daily.   . traMADol (ULTRAM) 50 MG tablet Take 1 tablet (50 mg total) by mouth every 12 (twelve) hours as needed for up to 5 days.  . Triamcinolone Acetonide (NASACORT ALLERGY 24HR) 55 MCG/ACT AERO Place 2 sprays into the nose 2 (two) times daily.  Marland Kitchen venlafaxine XR (EFFEXOR-XR) 37.5 MG 24 hr capsule TAKE 1 CAPSULE (37.5 MG TOTAL) BY MOUTH DAILY WITH BREAKFAST.  Marland Kitchen Vitamin D, Ergocalciferol, (DRISDOL) 1.25 MG (50000 UT) CAPS capsule Take 1 capsule (50,000 Units total) by mouth every 7 (seven) days.   No facility-administered encounter medications on file as of 03/25/2019.   :  Review of Systems:  Out of a complete 14 point review of systems, all are reviewed and negative with the exception of these symptoms as listed below:  Virtual Visit via Video Note on 03/25/2019:  I connected with Alicia [name] on 03/25/19 at  2:00 PM EDT by a video enabled telemedicine application and verified that I am speaking with the correct person using two identifiers.   I discussed the limitations of evaluation and management by telemedicine and the availability of in person appointments. The patient expressed understanding and agreed to proceed.  History of Present Illness: She reports Doing fairly well as far as the CPAP is concerned, she has a better  mattress now since she purchased a sleep number bed, she realized that she needed a softer mattress rather than are more firm mattress, from that standpoint she is doing well, tolerates the low-dose Adderall 10 mg twice daily and feels that it is helpful at this low dose.  She has been struggling with ongoing issues with her neck, left-sided neck pain with radiation to the shoulder.  She is now scheduled for an epidural cervical steroid injection with [facility] imaging this week.  She had to wait several weeks because of the COVID-19 pandemic pushed all procedural appointments out.   Observations/Objective: On examination, she is pleasant and conversant, in no acute distress, does  report left-sided neck pain, neck mobility actively mildly impaired.  Comprehension good, good orientation, good language skills, speech is clear without obvious dysarthria, hypophonia or voice tremor.  Face is symmetric with normal facial animation, corrective eyeglasses in place, good hearing.  Extraocular movements are well preserved in all directions, airway examination is stable, tongue motility normal, tongue protrudes centrally and palate elevates symmetrically.  Shoulder height is equal.  Motor exam is limited to upper extremity examination which shows fairly normal and symmetrical muscle bulk, upper body movements and coordination grossly intact.  Assessment and Plan: In summary, Alicia [name] a very pleasant 53 year old femalewith an underlying medical history of allergies and obesity, who returns for follow-up consultation of her OSA and hypersomnolence disorder. She is well established on CPAP therapy and Continues to be compliant, she is commended for her treatment adherence which is excellent currently. She is on symptomatic treatment For residual daytime somnolence with low-dose Adderall immediate release generic, up to twice daily. We mutually agreed to keep the prescription at this dose, 10 mg 1 pill up to twice daily. I renewed her prescription and suggested a follow-up in about 1 year. Of note, she has a family history of sleep apnea as well as narcolepsy. I answered all their questions today and the patient was in agreement.   Follow Up Instructions:    I discussed the assessment and treatment plan with the patient. The patient was provided an opportunity to ask questions and all were answered. The patient agreed with the plan and demonstrated an understanding of the instructions.   The patient was advised to call back or seek an in-person evaluation if the symptoms worsen or if the condition fails to improve as anticipated.  I provided 15 minutes of non-face-to-face time  during this encounter.   Star Age, MD

## 2019-03-25 NOTE — Progress Notes (Signed)
Order for cpap supplies sent to [facility] via community message. Confirmation received that the order transmitted was successful.  

## 2019-03-25 NOTE — Patient Instructions (Signed)
Given verbally, during today's virtual video-based encounter, with verbal feedback received.   

## 2019-03-26 ENCOUNTER — Telehealth: Payer: Self-pay

## 2019-03-26 NOTE — Telephone Encounter (Signed)
I called pt, scheduled her yearly follow up. Pt verbalized understanding of new appt date and time. 

## 2019-03-28 ENCOUNTER — Other Ambulatory Visit: Payer: Self-pay

## 2019-03-28 ENCOUNTER — Ambulatory Visit
Admission: RE | Admit: 2019-03-28 | Discharge: 2019-03-28 | Disposition: A | Discharge status: Home / Self Care | Payer: BC Managed Care – PPO | Source: Ambulatory Visit | Attending: Family Medicine | Admitting: Family Medicine

## 2019-03-28 DIAGNOSIS — J301 Allergic rhinitis due to pollen: Secondary | ICD-10-CM | POA: Diagnosis not present

## 2019-03-28 DIAGNOSIS — M4722 Other spondylosis with radiculopathy, cervical region: Secondary | ICD-10-CM | POA: Diagnosis not present

## 2019-03-28 DIAGNOSIS — J3081 Allergic rhinitis due to animal (cat) (dog) hair and dander: Secondary | ICD-10-CM | POA: Diagnosis not present

## 2019-03-28 DIAGNOSIS — J3089 Other allergic rhinitis: Secondary | ICD-10-CM | POA: Diagnosis not present

## 2019-03-28 DIAGNOSIS — M5030 Other cervical disc degeneration, unspecified cervical region: Secondary | ICD-10-CM

## 2019-03-28 MED ORDER — IOPAMIDOL (ISOVUE-M 300) INJECTION 61%
1.0000 mL | Freq: Once | INTRAMUSCULAR | Status: AC | PRN
  Administered 2019-03-28: 1 mL via EPIDURAL

## 2019-03-28 MED ORDER — TRIAMCINOLONE ACETONIDE 40 MG/ML IJ SUSP (RADIOLOGY)
60.0000 mg | Freq: Once | INTRAMUSCULAR | Status: AC
  Administered 2019-03-28: 60 mg via EPIDURAL

## 2019-03-28 NOTE — Discharge Instructions (Signed)

## 2019-04-01 ENCOUNTER — Encounter: Payer: Self-pay | Admitting: Family Medicine

## 2019-04-02 MED ORDER — METHOCARBAMOL 500 MG PO TABS: 500.0000 mg | ORAL_TABLET | Freq: Three times a day (TID) | ORAL | 0 refills | Status: DC

## 2019-04-03 ENCOUNTER — Other Ambulatory Visit: Payer: Self-pay | Admitting: Family Medicine

## 2019-04-04 DIAGNOSIS — J301 Allergic rhinitis due to pollen: Secondary | ICD-10-CM | POA: Diagnosis not present

## 2019-04-04 DIAGNOSIS — J3089 Other allergic rhinitis: Secondary | ICD-10-CM | POA: Diagnosis not present

## 2019-04-04 DIAGNOSIS — J3081 Allergic rhinitis due to animal (cat) (dog) hair and dander: Secondary | ICD-10-CM | POA: Diagnosis not present

## 2019-04-08 ENCOUNTER — Encounter: Payer: Self-pay | Admitting: Family Medicine

## 2019-04-08 DIAGNOSIS — J3081 Allergic rhinitis due to animal (cat) (dog) hair and dander: Secondary | ICD-10-CM | POA: Diagnosis not present

## 2019-04-08 DIAGNOSIS — J3089 Other allergic rhinitis: Secondary | ICD-10-CM | POA: Diagnosis not present

## 2019-04-08 DIAGNOSIS — J301 Allergic rhinitis due to pollen: Secondary | ICD-10-CM | POA: Diagnosis not present

## 2019-04-11 MED ORDER — VENLAFAXINE HCL ER 75 MG PO CP24: 75.0000 mg | ORAL_CAPSULE | Freq: Every day | ORAL | 3 refills | Status: DC

## 2019-04-12 DIAGNOSIS — J301 Allergic rhinitis due to pollen: Secondary | ICD-10-CM | POA: Diagnosis not present

## 2019-04-12 DIAGNOSIS — J3089 Other allergic rhinitis: Secondary | ICD-10-CM | POA: Diagnosis not present

## 2019-04-12 DIAGNOSIS — J3081 Allergic rhinitis due to animal (cat) (dog) hair and dander: Secondary | ICD-10-CM | POA: Diagnosis not present

## 2019-04-19 DIAGNOSIS — M542 Cervicalgia: Secondary | ICD-10-CM | POA: Diagnosis not present

## 2019-04-19 DIAGNOSIS — M5412 Radiculopathy, cervical region: Secondary | ICD-10-CM | POA: Diagnosis not present

## 2019-04-25 DIAGNOSIS — J301 Allergic rhinitis due to pollen: Secondary | ICD-10-CM | POA: Diagnosis not present

## 2019-04-25 DIAGNOSIS — J3081 Allergic rhinitis due to animal (cat) (dog) hair and dander: Secondary | ICD-10-CM | POA: Diagnosis not present

## 2019-04-25 DIAGNOSIS — J3089 Other allergic rhinitis: Secondary | ICD-10-CM | POA: Diagnosis not present

## 2019-04-25 DIAGNOSIS — M542 Cervicalgia: Secondary | ICD-10-CM | POA: Diagnosis not present

## 2019-05-02 DIAGNOSIS — J3089 Other allergic rhinitis: Secondary | ICD-10-CM | POA: Diagnosis not present

## 2019-05-02 DIAGNOSIS — N6310 Unspecified lump in the right breast, unspecified quadrant: Secondary | ICD-10-CM | POA: Diagnosis not present

## 2019-05-02 DIAGNOSIS — J301 Allergic rhinitis due to pollen: Secondary | ICD-10-CM | POA: Diagnosis not present

## 2019-05-02 DIAGNOSIS — N6001 Solitary cyst of right breast: Secondary | ICD-10-CM | POA: Diagnosis not present

## 2019-05-02 DIAGNOSIS — Z803 Family history of malignant neoplasm of breast: Secondary | ICD-10-CM | POA: Diagnosis not present

## 2019-05-02 DIAGNOSIS — J3081 Allergic rhinitis due to animal (cat) (dog) hair and dander: Secondary | ICD-10-CM | POA: Diagnosis not present

## 2019-05-03 DIAGNOSIS — G4733 Obstructive sleep apnea (adult) (pediatric): Secondary | ICD-10-CM | POA: Diagnosis not present

## 2019-05-03 DIAGNOSIS — M542 Cervicalgia: Secondary | ICD-10-CM | POA: Diagnosis not present

## 2019-05-04 ENCOUNTER — Other Ambulatory Visit: Payer: Self-pay | Admitting: Family Medicine

## 2019-05-06 DIAGNOSIS — M5412 Radiculopathy, cervical region: Secondary | ICD-10-CM | POA: Diagnosis not present

## 2019-05-10 DIAGNOSIS — J3089 Other allergic rhinitis: Secondary | ICD-10-CM | POA: Diagnosis not present

## 2019-05-10 DIAGNOSIS — J3081 Allergic rhinitis due to animal (cat) (dog) hair and dander: Secondary | ICD-10-CM | POA: Diagnosis not present

## 2019-05-10 DIAGNOSIS — J301 Allergic rhinitis due to pollen: Secondary | ICD-10-CM | POA: Diagnosis not present

## 2019-05-20 DIAGNOSIS — J301 Allergic rhinitis due to pollen: Secondary | ICD-10-CM | POA: Diagnosis not present

## 2019-05-20 DIAGNOSIS — J3081 Allergic rhinitis due to animal (cat) (dog) hair and dander: Secondary | ICD-10-CM | POA: Diagnosis not present

## 2019-05-20 DIAGNOSIS — J3089 Other allergic rhinitis: Secondary | ICD-10-CM | POA: Diagnosis not present

## 2019-05-31 DIAGNOSIS — M5412 Radiculopathy, cervical region: Secondary | ICD-10-CM | POA: Diagnosis not present

## 2019-05-31 DIAGNOSIS — J3081 Allergic rhinitis due to animal (cat) (dog) hair and dander: Secondary | ICD-10-CM | POA: Diagnosis not present

## 2019-05-31 DIAGNOSIS — J301 Allergic rhinitis due to pollen: Secondary | ICD-10-CM | POA: Diagnosis not present

## 2019-05-31 DIAGNOSIS — J3089 Other allergic rhinitis: Secondary | ICD-10-CM | POA: Diagnosis not present

## 2019-06-04 DIAGNOSIS — J301 Allergic rhinitis due to pollen: Secondary | ICD-10-CM | POA: Diagnosis not present

## 2019-06-04 DIAGNOSIS — J3081 Allergic rhinitis due to animal (cat) (dog) hair and dander: Secondary | ICD-10-CM | POA: Diagnosis not present

## 2019-06-04 DIAGNOSIS — J3089 Other allergic rhinitis: Secondary | ICD-10-CM | POA: Diagnosis not present

## 2019-06-11 DIAGNOSIS — J3089 Other allergic rhinitis: Secondary | ICD-10-CM | POA: Diagnosis not present

## 2019-06-11 DIAGNOSIS — J3081 Allergic rhinitis due to animal (cat) (dog) hair and dander: Secondary | ICD-10-CM | POA: Diagnosis not present

## 2019-06-11 DIAGNOSIS — J301 Allergic rhinitis due to pollen: Secondary | ICD-10-CM | POA: Diagnosis not present

## 2019-06-11 DIAGNOSIS — R21 Rash and other nonspecific skin eruption: Secondary | ICD-10-CM | POA: Diagnosis not present

## 2019-06-14 DIAGNOSIS — M5412 Radiculopathy, cervical region: Secondary | ICD-10-CM | POA: Diagnosis not present

## 2019-06-27 DIAGNOSIS — J301 Allergic rhinitis due to pollen: Secondary | ICD-10-CM | POA: Diagnosis not present

## 2019-06-27 DIAGNOSIS — J3089 Other allergic rhinitis: Secondary | ICD-10-CM | POA: Diagnosis not present

## 2019-06-27 DIAGNOSIS — J3081 Allergic rhinitis due to animal (cat) (dog) hair and dander: Secondary | ICD-10-CM | POA: Diagnosis not present

## 2019-07-31 DIAGNOSIS — J3081 Allergic rhinitis due to animal (cat) (dog) hair and dander: Secondary | ICD-10-CM | POA: Diagnosis not present

## 2019-07-31 DIAGNOSIS — J3089 Other allergic rhinitis: Secondary | ICD-10-CM | POA: Diagnosis not present

## 2019-07-31 DIAGNOSIS — J301 Allergic rhinitis due to pollen: Secondary | ICD-10-CM | POA: Diagnosis not present

## 2019-08-01 DIAGNOSIS — G4733 Obstructive sleep apnea (adult) (pediatric): Secondary | ICD-10-CM | POA: Diagnosis not present

## 2019-08-07 ENCOUNTER — Other Ambulatory Visit: Payer: Self-pay | Admitting: Neurology

## 2019-08-07 MED ORDER — AMPHETAMINE-DEXTROAMPHETAMINE 10 MG PO TABS: 10.0000 mg | ORAL_TABLET | Freq: Two times a day (BID) | ORAL | 0 refills | Status: DC

## 2019-08-07 NOTE — Telephone Encounter (Signed)
Pt is due for a refill on adderall. Pt is up to date on her appts. [place] Controlled Substance Registry has been checked.

## 2019-08-09 DIAGNOSIS — J3081 Allergic rhinitis due to animal (cat) (dog) hair and dander: Secondary | ICD-10-CM | POA: Diagnosis not present

## 2019-08-09 DIAGNOSIS — J301 Allergic rhinitis due to pollen: Secondary | ICD-10-CM | POA: Diagnosis not present

## 2019-08-09 DIAGNOSIS — J3089 Other allergic rhinitis: Secondary | ICD-10-CM | POA: Diagnosis not present

## 2019-08-29 DIAGNOSIS — J3089 Other allergic rhinitis: Secondary | ICD-10-CM | POA: Diagnosis not present

## 2019-08-29 DIAGNOSIS — J3081 Allergic rhinitis due to animal (cat) (dog) hair and dander: Secondary | ICD-10-CM | POA: Diagnosis not present

## 2019-08-29 DIAGNOSIS — J301 Allergic rhinitis due to pollen: Secondary | ICD-10-CM | POA: Diagnosis not present

## 2019-09-06 DIAGNOSIS — J3089 Other allergic rhinitis: Secondary | ICD-10-CM | POA: Diagnosis not present

## 2019-09-06 DIAGNOSIS — J301 Allergic rhinitis due to pollen: Secondary | ICD-10-CM | POA: Diagnosis not present

## 2019-09-06 DIAGNOSIS — J3081 Allergic rhinitis due to animal (cat) (dog) hair and dander: Secondary | ICD-10-CM | POA: Diagnosis not present

## 2019-09-12 DIAGNOSIS — J3081 Allergic rhinitis due to animal (cat) (dog) hair and dander: Secondary | ICD-10-CM | POA: Diagnosis not present

## 2019-09-12 DIAGNOSIS — J3089 Other allergic rhinitis: Secondary | ICD-10-CM | POA: Diagnosis not present

## 2019-09-12 DIAGNOSIS — J301 Allergic rhinitis due to pollen: Secondary | ICD-10-CM | POA: Diagnosis not present

## 2019-09-18 DIAGNOSIS — J301 Allergic rhinitis due to pollen: Secondary | ICD-10-CM | POA: Diagnosis not present

## 2019-09-18 DIAGNOSIS — J3081 Allergic rhinitis due to animal (cat) (dog) hair and dander: Secondary | ICD-10-CM | POA: Diagnosis not present

## 2019-09-18 DIAGNOSIS — J3089 Other allergic rhinitis: Secondary | ICD-10-CM | POA: Diagnosis not present

## 2019-10-01 DIAGNOSIS — Z124 Encounter for screening for malignant neoplasm of cervix: Secondary | ICD-10-CM | POA: Diagnosis not present

## 2019-10-01 DIAGNOSIS — Z1239 Encounter for other screening for malignant neoplasm of breast: Secondary | ICD-10-CM | POA: Diagnosis not present

## 2019-10-01 DIAGNOSIS — Z01419 Encounter for gynecological examination (general) (routine) without abnormal findings: Secondary | ICD-10-CM | POA: Diagnosis not present

## 2019-10-04 IMAGING — DX DG CERVICAL SPINE COMPLETE 4+V
6 series · 6 of 6 positions shown · non-contrast
Comparison: MRI 02/11/2014 .

CLINICAL DATA: Chronic cervical pain.

EXAM:
CERVICAL SPINE - COMPLETE 4+ VIEW

[c-spine lat]
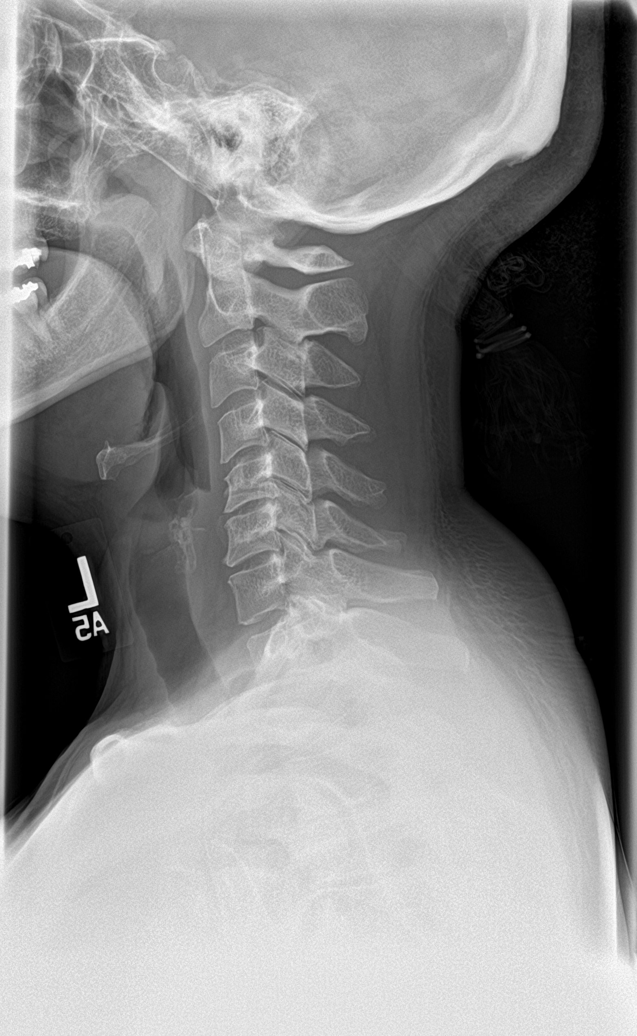

[c-spine obl (1 of 2)]
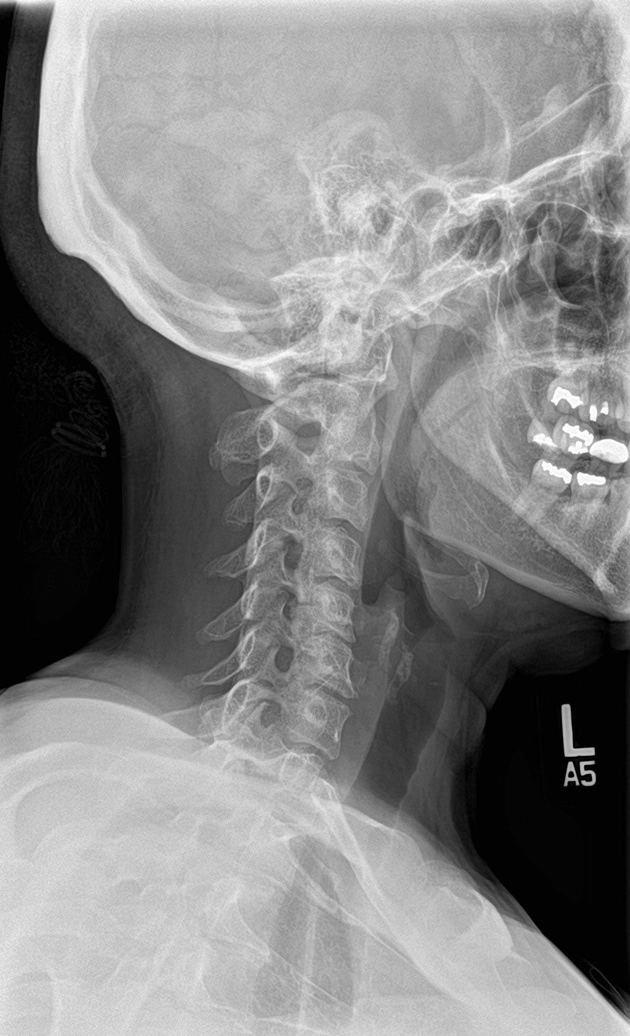

[c-spine obl (2 of 2)]
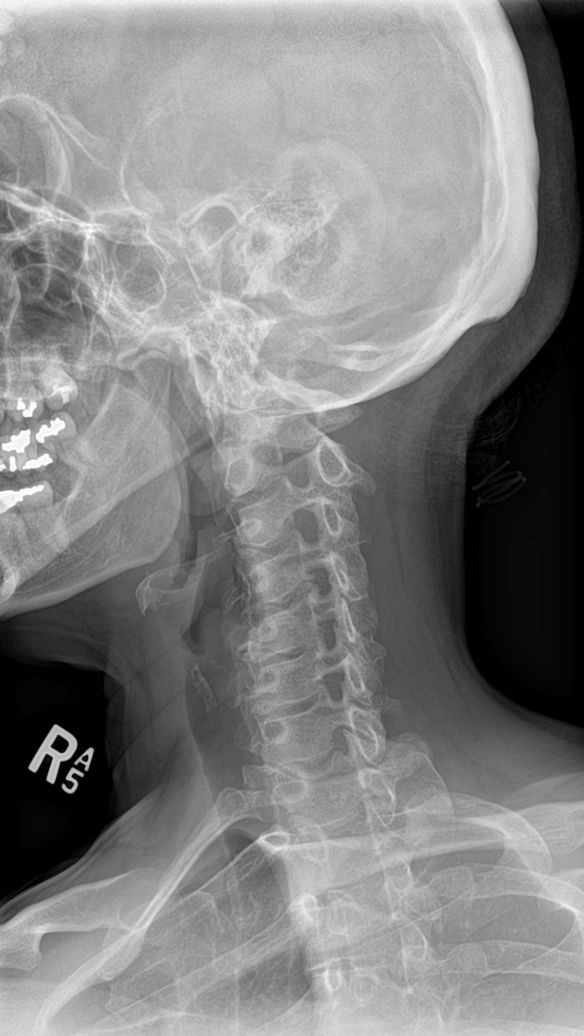

[c-spine ap]
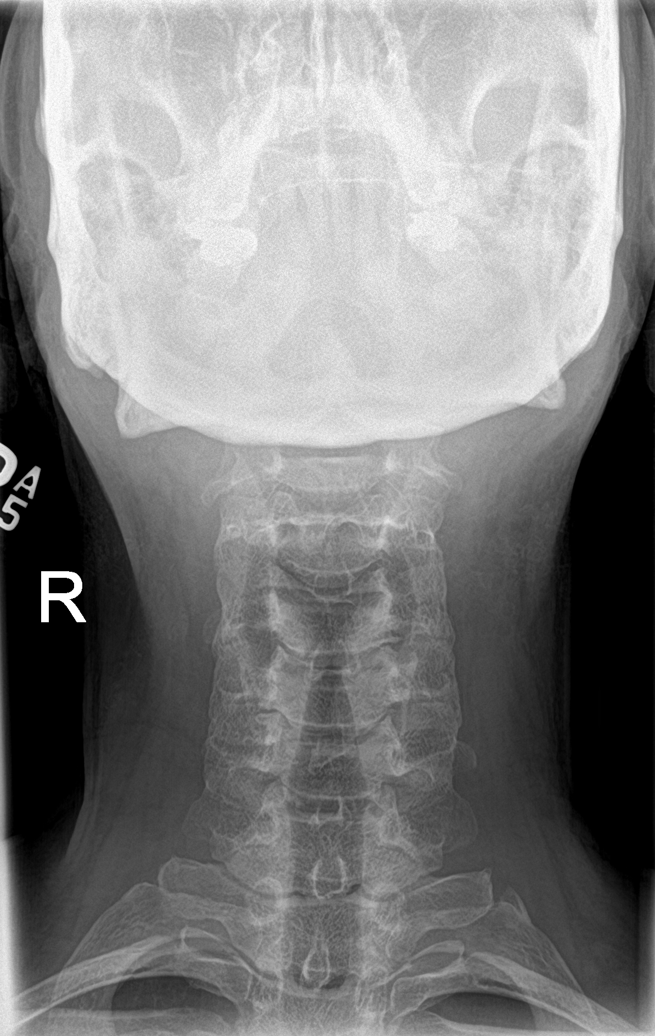

[c-spine open mouth]
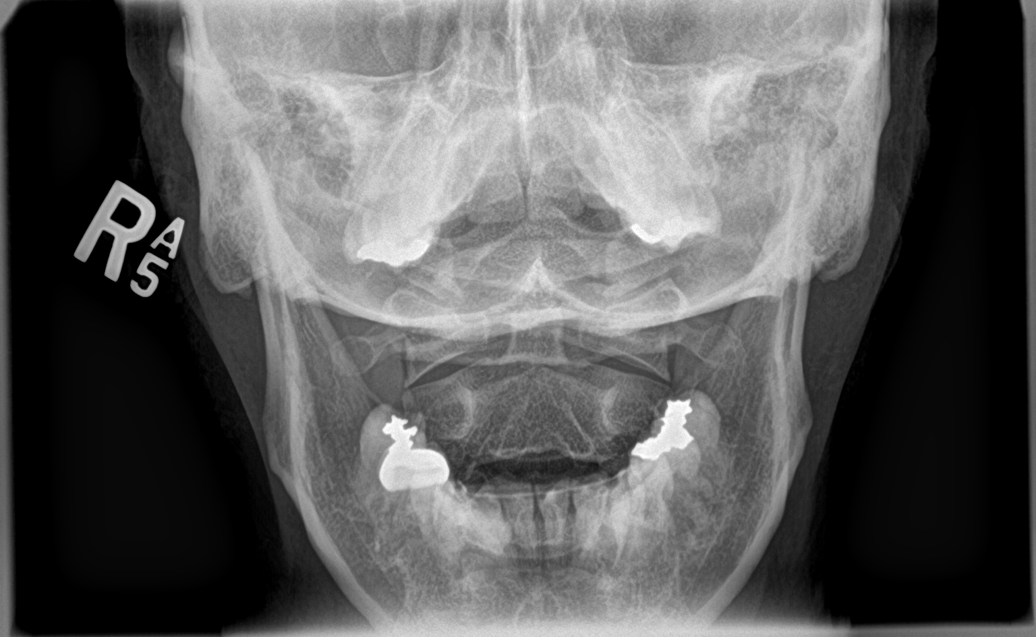

[swimmer]
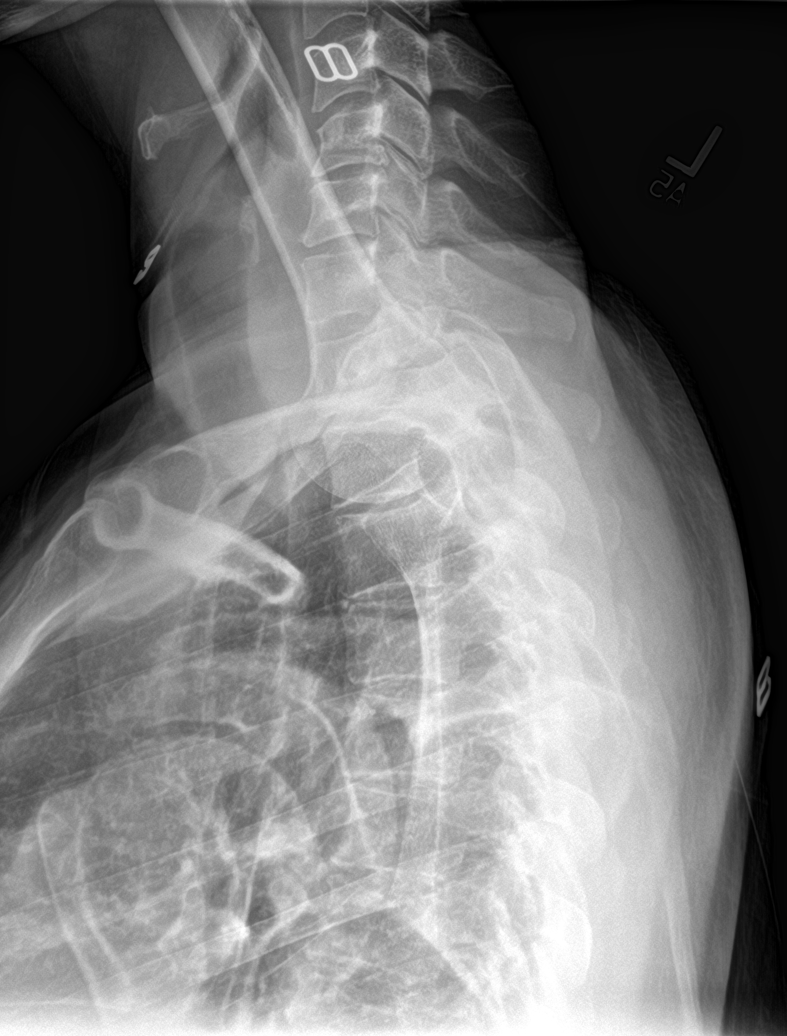

[6 of 6 positions shown; findings below may reference images not displayed]

FINDINGS: Degenerative change cervical spine. Degenerative changes most
prominent C4-C5, C5-C6, C6-C7. Narrowing of the right C5-C6
neuroforamen. No acute bony abnormality identified.
IMPRESSION: Degenerative changes cervical spine with mild straightening of the
cervical spine. Degenerative changes most prominent C4-C5, C5-C6,
C6-C7. Narrowing of the right C5-C6 neural foramen noted. No acute
bony abnormality identified.

## 2019-10-11 DIAGNOSIS — J301 Allergic rhinitis due to pollen: Secondary | ICD-10-CM | POA: Diagnosis not present

## 2019-10-11 DIAGNOSIS — J3081 Allergic rhinitis due to animal (cat) (dog) hair and dander: Secondary | ICD-10-CM | POA: Diagnosis not present

## 2019-10-11 DIAGNOSIS — J3089 Other allergic rhinitis: Secondary | ICD-10-CM | POA: Diagnosis not present

## 2019-12-20 ENCOUNTER — Other Ambulatory Visit: Payer: Self-pay

## 2019-12-20 ENCOUNTER — Ambulatory Visit: Payer: BC Managed Care – PPO | Attending: Internal Medicine

## 2019-12-20 DIAGNOSIS — Z23 Encounter for immunization: Secondary | ICD-10-CM | POA: Insufficient documentation

## 2019-12-20 NOTE — Progress Notes (Signed)
   Covid-19 Vaccination Clinic  Name:  Alicia [name]    MRN: [id number] DOB: 1966-08-21  12/20/2019  [name] was observed post Covid-19 immunization for 15 minutes without incidence. She was provided with Vaccine Information Sheet and instruction to access the V-Safe system.   [name] was instructed to call 911 with any severe reactions post vaccine: Marland Kitchen Difficulty breathing  . Swelling of your face and throat  . A fast heartbeat  . A bad rash all over your body  . Dizziness and weakness    Immunizations Administered    Name Date Dose VIS Date Route   Pfizer COVID-19 Vaccine 12/20/2019  4:34 PM 0.3 mL 10/04/2019 Intramuscular   Manufacturer: [employer], [employer]   Lot: [id number]   NDC: [address]-5

## 2020-01-15 ENCOUNTER — Ambulatory Visit: Payer: Self-pay | Attending: Internal Medicine

## 2020-01-15 DIAGNOSIS — Z23 Encounter for immunization: Secondary | ICD-10-CM

## 2020-01-15 NOTE — Progress Notes (Signed)
   Covid-19 Vaccination Clinic  Name:  Alicia [name]    MRN: [id number] DOB: 1966/03/05  01/15/2020  [name] was observed post Covid-19 immunization for 15 minutes without incident. She was provided with Vaccine Information Sheet and instruction to access the V-Safe system.   [name] was instructed to call 911 with any severe reactions post vaccine: Marland Kitchen Difficulty breathing  . Swelling of face and throat  . A fast heartbeat  . A bad rash all over body  . Dizziness and weakness   Immunizations Administered    Name Date Dose VIS Date Route   Pfizer COVID-19 Vaccine 01/15/2020  9:06 AM 0.3 mL 10/04/2019 Intramuscular   Manufacturer: [employer], [employer]   Lot: [id number]   NDC: [address]-0

## 2020-01-16 ENCOUNTER — Other Ambulatory Visit: Payer: Self-pay | Admitting: Neurology

## 2020-01-16 MED ORDER — AMPHETAMINE-DEXTROAMPHETAMINE 10 MG PO TABS: 10.0000 mg | ORAL_TABLET | Freq: Two times a day (BID) | ORAL | 0 refills | Status: DC

## 2020-03-11 ENCOUNTER — Ambulatory Visit: Payer: Self-pay | Admitting: Family Medicine

## 2020-03-11 ENCOUNTER — Encounter: Payer: Self-pay | Admitting: Family Medicine

## 2020-03-11 ENCOUNTER — Other Ambulatory Visit: Payer: Self-pay

## 2020-03-11 ENCOUNTER — Ambulatory Visit (INDEPENDENT_AMBULATORY_CARE_PROVIDER_SITE_OTHER): Payer: Self-pay | Admitting: Family Medicine

## 2020-03-11 DIAGNOSIS — M5030 Other cervical disc degeneration, unspecified cervical region: Secondary | ICD-10-CM

## 2020-03-11 MED ORDER — VENLAFAXINE HCL ER 37.5 MG PO CP24: 37.5000 mg | ORAL_CAPSULE | Freq: Every day | ORAL | 1 refills | Status: DC

## 2020-03-11 NOTE — Progress Notes (Signed)
Virtual Visit via Video Note  I connected with Alicia [name] on 03/11/20 at  4:00 PM EDT by a video enabled telemedicine application and verified that I am speaking with the correct person using two identifiers.  Location: Patient: Patient is at home alone Provider: In office setting   I discussed the limitations of evaluation and management by telemedicine and the availability of in person appointments. The patient expressed understanding and agreed to proceed.  History of Present Illness: 55 year old female who has had chronic degenerative disc disease of the cervical spine.  Has responded well to medications including gabapentin, Effexor, as well as meloxicam.  Over the course of the Covid year has had to decrease her activity.  Patient's brother-in-law died of Covid and has been doing a lot more with his estate.  States that she is going to start becoming more active and wants to start to discontinue the Effexor.  In addition of this patient has already discontinued the gabapentin and the meloxicam.    Observations/Objective: Alert and oriented x3, very pleasant.  Asked good questions.  Had difficulty with virtual platform so did most over the phone  Assessment and Plan: 54 year old female with degenerative disc disease and spinal stenosis and usually with no radicular symptoms.  Patient wants to discontinue the Effexor and will start titrating down.  Seems to be doing well but has not been working out as regularly.  We discussed the possibility of formal physical therapy but patient feels like she can do it on her own.  Discussed icing regimen.  Increase activity slowly.  Follow-up again in 4 to 8 weeks   Follow Up Instructions: 4 to 8 weeks virtually or in office    I discussed the assessment and treatment plan with the patient. The patient was provided an opportunity to ask questions and all were answered. The patient agreed with the plan and demonstrated an understanding of the  instructions.   The patient was advised to call back or seek an in-person evaluation if the symptoms worsen or if the condition fails to improve as anticipated.  I provided 11 minutes of face-to-face time during this encounter.   Judi Saa, DO

## 2020-03-26 ENCOUNTER — Encounter: Payer: Self-pay | Admitting: Neurology

## 2020-03-26 ENCOUNTER — Ambulatory Visit: Payer: Self-pay | Admitting: Neurology

## 2020-03-26 ENCOUNTER — Other Ambulatory Visit: Payer: Self-pay

## 2020-03-26 VITALS — BP 122/70 | HR 56 | Ht 65.5 in | Wt 209.0 lb

## 2020-03-26 DIAGNOSIS — Z9989 Dependence on other enabling machines and devices: Secondary | ICD-10-CM

## 2020-03-26 DIAGNOSIS — G4733 Obstructive sleep apnea (adult) (pediatric): Secondary | ICD-10-CM

## 2020-03-26 DIAGNOSIS — G4710 Hypersomnia, unspecified: Secondary | ICD-10-CM

## 2020-03-26 DIAGNOSIS — G4730 Sleep apnea, unspecified: Secondary | ICD-10-CM

## 2020-03-26 NOTE — Progress Notes (Signed)
Subjective:    Patient ID: Alicia [name] is a 54 y.o. female.  HPI     Interim history:   Alicia [name] is a very pleasant 54 year old right-handed woman with an underlying medical history of allergies, hyperlipidemia, and obstructive sleep apnea on CPAP with residual daytime somnolence, who presents for follow-up consultation of her sleep apnea and hypersomnolence d/o. The patient is unaccompanied today. I last saw her on 03/25/2019 in a virtual visit, at which time she was compliant with her CPAP.  She had ongoing issues with neck pain and was supposed to get a epidural steroid injection in her neck soon.  She was advised to follow-up routinely in 1 year.  Today, 03/26/2020: I reviewed her CPAP compliance data from 02/24/2020 through 03/24/2020, which is a total of 30 days, during which time she used her machine every night with percent use days greater than 4 hours at 97%, indicating excellent compliance with an average usage of 7 hours and 16 minutes, residual AHI mildly elevated at 9.3 with primarily central apneas, leak acceptable, 95th percentile at 8.4 L/min, pressure of 11 cm with EPR of 2.  She reports doing well, she takes adderall 10 mg once daily on average, some insomnia, if she takes it bid.  She is followed by sports medicine for her neck pain. Uses nasal pillows, some discomfort with the mask, usually up to date with supplies.  The patient's allergies, current medications, family history, past medical history, past social history, past surgical history and problem list were reviewed and updated as appropriate.    Previously (copied from previous notes for reference):   I saw her on 07/17/2018, at which time she was compliant with her CPAP.  She was taking Adderall 20 mg strength generic half a pill to 1 pill daily on average.  We mutually agreed to change her prescription to 10 mg strength so she would not have to break the 20 mg in half.   I reviewed her CPAP compliance data from  02/19/2019 through 03/20/2019 which is a total of 30 days, during which time she used her machine every night with percent used days greater than 4 hours at 100%, indicating superb compliance with an average usage of 8 hours and 3 minutes which is very good, residual AHI slightly elevated at 7.8/h, leak on the low end, with a 95th percentile at 6.6 L/min on a pressure of 11 cm with EPR of 2   I saw her on 01/11/2018, at which time she was fully compliant with her CPAP. She was doing quite well on her stimulant but was not always taking her second dose. She had significant neck pain and had been to physical therapy, had injection under neurosurgery as well. I suggested we maintain her low dose Adderall generic 10 mg twice a day.   I reviewed her CPAP compliance data from 06/12/2018 through 07/11/2018 which is a total of 30 days, during which time she used her CPAP 29 days with percent used days greater than 4 hours at 90%, indicating excellent compliance with an average usage of 6 hours and 57 minutes, residual AHI borderline at 4.9 per hour, leak acceptable with the 95th percentile at 8.6 L/m on a pressure of 11 cm with EPR of 2.    I reviewed her CPAP compliance data from 12/09/2017 through 01/07/2018 which is a total of 30 days, during which time she used her CPAP every night with percent used days greater than 4 hours at 100%, indicating superb  compliance with an average usage of 7 hours and 14 minutes, residual AHI borderline at 5.9 per hour, leaked low with the 95th percentile at 4.4 L/m on a pressure of 10 cm with EPR of 2.   I saw her on 07/13/2017, at which time she was compliant with CPAP. She had issues with neck pain and went through physical therapy with dry needling. She was supposed to see orthopedics She was doing reasonably well on her Adderall and was compliant with CPAP therapy. I suggested a six-month follow-up.    I saw her on 01/05/2017, at which time she was compliant with her CPAP. She  had occasional difficulty maintaining sleep at night, was attributing this to taking the Adderall. She was trying to be careful not to take it too late after lunch. I suggested we continue Adderall immediate release 10 mg twice a day.    I reviewed her CPAP compliance data from 06/11/2017 through 07/10/2017 which is a total of 30 days, during which time she used her CPAP every night with percent used days greater than 4 hours at 100%, indicating superb compliance with an average usage of 7 hours and 11 minutes, residual AHI borderline at 6.4 per hour, leak acceptable with the 95th percentile at 12.6 L/m on a pressure of 10 cm with EPR of 2.    I reviewed her CPAP compliance data from 12/06/2016 through 01/04/2017 which is a total of 30 days, during which time she used her CPAP every night with percent used days greater than 4 hours at 100%, indicating superb compliance with an average usage of 6 hours and 29 minutes, residual AHI at goal at 3.2 per hour, leak low with the 95th percentile at 3.1 L/m on a pressure of 10 cm with EPR of 2.     I saw her on 07/07/2016 after a gap of nearly 2 years, at which time she reported doing okay, weight had been fluctuating, but was going to the gym on a fairly regular basis. She was fully compliant with CPAP, still had sleepiness during the day especially in the afternoons, particularly after eating a carb-laden meal. Other than that, she was stable, taking a vitamin D supplement and other supplements, vitamin D was low per her report and she was told to start taking over-the-counter vit D per PCP. She had not tried the Adderall that I suggested at the last visit and had stopped taking the Nuvigil even though it worked okay because of was too expensive. I suggested starting her on Adderall immediate release 10 mg once daily in the early afternoon. She emailed in the interim in November 2017, reporting being a little bit more focused during the day and not overeating as much  but as far as daytime somnolence she did not notice any telltale was palms. I suggested we increase the Adderall to 10 mg twice daily, second dose no later than 3 PM to avoid nighttime insomnia.   I saw her on 08/21/2014, at which time she reported doing well on Nuvigil 150 mg once daily and she felt that it was better than the Provigil generic. She had some residual sleepiness but felt improved overall. She was compliant with CPAP therapy. I suggested we try low-dose Adderall as needed for residual sleepiness.   I reviewed her CPAP compliance data from 06/07/2016 through 07/06/2016 which is a total of 30 days, during which time she used her machine every night with percent used days greater than 4 hours at 100%,  indicating superb compliance with an average usage of 7 hours and 4 minutes, residual AHI 4.1 per hour, leak low for the 95th percentile at 2.6 L/m on a pressure of 10 cm with EPR of 2.   I saw her on 02/13/2014, at which time she continued to endorse daytime somnolence. She had just been approved for Nuvigil but had not started it yet. She was fully compliant with CPAP therapy.   I reviewed her compliance data from the compliance card on her machine: 07/22/2014 through 08/20/2014 which is a total of 30 days during which time she used her machine every night with percent used days greater than 4 hours of 97%, indicating excellent compliance, pressure still at 10 cm with EPR of 2. Residual AHI acceptable at 4.9 per hour and leak generally low with the 95th percentile of leak at 6.2 L/m, average usage of 7 hours and 19 minutes. She is compliant with her CPAP machine, she has a [employer] nasal pillows. She has exacerbation of her allergies and also facial eczema. She has recently seen a dermatologist and was given a new ointment.   I saw her on 08/15/2013, at which time we talked about her sleep test results from 2013 and her CPAP compliance which was excellent. She has a family history of  narcolepsy with cataplexy in her father. She felt Provigil as needed was helpful. She had trouble remembering the second dose of Provigil and therefore I suggested a trial of Nuvigil once daily. She was approved for this.    I first met her on 05/23/2013, at which time she reported being veyr sleepy despite being compliant with CPAP therapy. I suggested that she try Provigil to help stay awake during the day a little better, starting with 200 mg strength half a pill up to twice daily as needed. I advised her not to take it after 3 PM to avoid insomnia at night.  She has a FHx of narcolepsy with cataplexy in her father and was particularly concerned that she may have narcolepsy.    I had reviewed records including a recent CPAP compliance download from 04/02/2013 through 05/22/2013, total of 51 days, during which time she used CPAP every day. Percent used days greater than 4 hours was 96% indicating excellent compliance. Her CPAP pressure is 10 cm water pressure with an EPR of 2. Residual AHI is 4.7 indicating an appropriate pressure level. Average usage was 6:41 hours.    I reviewed a sleep study report from 11/10/2011. This was a split-night study. REM percentage was 17% and the baseline portion of the study. Her AHI was 61.9 per hour with an oxyhemoglobin desaturation nadir of 75%. She was titrated on CPAP from 4-10 cm of water pressure with elimination of her sleep disordered breathing reported.    She uses a nasal pillows mask. I also reviewed older compliance data from August to Nov. 2013, during which time she was very compliant but had a high leak. At the time, she was using a FFM and leak was much better with the new mask.   Her Past Medical History Is Significant For: Past Medical History:  Diagnosis Date  . Allergy   . Hypersomnia with sleep apnea, unspecified 05/23/2013  . OSA on CPAP 08/15/2013    Her Past Surgical History Is Significant For: Past Surgical History:  Procedure  Laterality Date  . HYSTEROSCOPY WITH D & C    . WISDOM TOOTH EXTRACTION      Her Family History  Is Significant For: Family History  Problem Relation Age of Onset  . Diabetes Father        type 2  . Depression Father   . Dementia Father   . Narcolepsy Father        cataplexy  . High Cholesterol Father   . Stroke Maternal Grandmother   . Heart disease Maternal Grandfather     Her Social History Is Significant For: Social History   Socioeconomic History  . Marital status: Single    Spouse name: Not on file  . Number of children: 0  . Years of education: Post Grad  . Highest education level: Not on file  Occupational History    Comment: [employer]  Tobacco Use  . Smoking status: Never Smoker  . Smokeless tobacco: Never Used  Substance and Sexual Activity  . Alcohol use: No  . Drug use: No  . Sexual activity: Never  Other Topics Concern  . Not on file  Social History Narrative   Patient lives at home alone.   Caffeine Use: occasionally soda   Social Determinants of Health   Financial Resource Strain:   . Difficulty of Paying Living Expenses:   Food Insecurity:   . Worried About Charity fundraiser in the Last Year:   . Arboriculturist in the Last Year:   Transportation Needs:   . Film/video editor (Medical):   Marland Kitchen Lack of Transportation (Non-Medical):   Physical Activity:   . Days of Exercise per Week:   . Minutes of Exercise per Session:   Stress:   . Feeling of Stress :   Social Connections:   . Frequency of Communication with Friends and Family:   . Frequency of Social Gatherings with Friends and Family:   . Attends Religious Services:   . Active Member of Clubs or Organizations:   . Attends Archivist Meetings:   Marland Kitchen Marital Status:     Her Allergies Are:  Allergies  Allergen Reactions  . Latex   . Other     Seasonal   . Lubricants   :   Her Current Medications Are:  Outpatient Encounter Medications as of 03/26/2020   Medication Sig  . amphetamine-dextroamphetamine (ADDERALL) 10 MG tablet Take 1 tablet (10 mg total) by mouth 2 (two) times daily with a meal.  . b complex vitamins capsule Take 1 capsule by mouth daily.  . Bepotastine Besilate (BEPREVE) 1.5 % SOLN Place 1 drop into both eyes once.  . betamethasone valerate lotion (VALISONE) 0.1 % Apply 1 application topically daily.  Marland Kitchen desonide (DESOWEN) 0.05 % lotion Apply 1 application topically 2 (two) times daily.  . fexofenadine (ALLEGRA) 180 MG tablet Take 180 mg by mouth daily.  Marland Kitchen gabapentin (NEURONTIN) 100 MG capsule TAKE 2 CAPSULES (200 MG TOTAL) BY MOUTH AT BEDTIME.  . Multiple Vitamin (MULTIVITAMIN) tablet Take 1 tablet by mouth daily.  . pimecrolimus (ELIDEL) 1 % cream Apply 1 application topically 2 (two) times daily.  . Triamcinolone Acetonide (NASACORT ALLERGY 24HR) 55 MCG/ACT AERO Place 2 sprays into the nose 2 (two) times daily.  Marland Kitchen venlafaxine XR (EFFEXOR XR) 37.5 MG 24 hr capsule Take 1 capsule (37.5 mg total) by mouth daily with breakfast.  . [DISCONTINUED] meloxicam (MOBIC) 15 MG tablet TAKE 1 TABLET BY MOUTH EVERY DAY (Patient not taking: Reported on 03/26/2020)  . [DISCONTINUED] methocarbamol (ROBAXIN) 500 MG tablet Take 1 tablet (500 mg total) by mouth 3 (three) times daily. (Patient not taking:  Reported on 03/26/2020)  . [DISCONTINUED] Vitamin D, Ergocalciferol, (DRISDOL) 1.25 MG (50000 UT) CAPS capsule Take 1 capsule (50,000 Units total) by mouth every 7 (seven) days. (Patient not taking: Reported on 03/26/2020)   No facility-administered encounter medications on file as of 03/26/2020.  :  Review of Systems:  Out of a complete 14 point review of systems, all are reviewed and negative with the exception of these symptoms as listed below: Review of Systems  Objective:  Neurological Exam  Physical Exam Physical Examination:   Vitals:   03/26/20 1039  BP: 122/70  Pulse: (!) 56   General Examination: The patient is a very pleasant 54  y.o. female in no acute distress. She appears well-developed and well-nourished and well groomed.   HEENT:Normocephalic, atraumatic, pupils are equal, round and reactive to light and accommodation. Extraocular tracking is good without limitation to gaze excursion or nystagmus noted. Normal smooth pursuit is noted. Hearing is grossly intact. Face is symmetric with normal facial animation and normal facial sensation. Speech is clear with no dysarthria noted. There is no hypophonia. There is no lip, neck/head, jaw or voice tremor. Oropharynx exam reveals:no obvious change, mild mouth dryness noted.  Chest:Clear to auscultation without wheezing, rhonchi or crackles noted.  Heart:S1+S2+0, regular and normal without murmurs, rubs or gallops noted.   Abdomen:Soft, non-tender and non-distended.  Extremities:There is noobviousedema in the distal lower extremities bilaterally.   Skin: Warm and dry without trophic changes noted.  Musculoskeletal: exam reveals no obvious joint deformities, tenderness or joint swelling or erythema.   Neurologically:  Mental status: The patient is awake, alert and oriented in all 4 spheres. Herimmediate and remote memory, attention, language skills and fund of knowledge are appropriate. There is no evidence of aphasia, agnosia, apraxia or anomia. Speech is clear with normal prosody and enunciation. Thought process is linear. Mood is normaland affect is normal.  Cranial nerves II - XII are as described above under HEENT exam.  Motor exam: Normal bulk, strength and tone is noted. There is no drift, tremor or rebound. Fine motor skills and coordination: intact.  Cerebellar testing: No dysmetria or intention tremor. There is no truncal or gait ataxia.  Sensory exam: intact to light touch in the upper and lower extremities.  Gait, station and balance: Shestands easily. No veering to one side is noted. No leaning to one side is noted. Posture is age-appropriate  and stance is narrow based. Gait shows normalstride length and normalpace. No problems turning are noted.   Assessment and Plan:  In summary, Alicia Kilimanjarois a very pleasant 54 year old femalewith an underlying medical history of allergies and obesity, who returns for follow-up consultation of her OSA and hypersomnolence disorder. She is well established on CPAP therapy and compliant. She is on symptomatic treatment with low-dose Adderall immediate release generic, up to twice daily. Usually takes 1/day, can take 1 pill up to twice daily. She is advised to FU in one year routinely, sooner if needed. She is encouraged to continue to be compliant with her CPAP. She has a family history of sleep apnea as well as narcolepsy. I answered all their questions today and the patient was in agreement.  I spent 20 minutes in total face-to-face time and in reviewing records during pre-charting, more than 50% of which was spent in counseling and coordination of care, reviewing test results, reviewing medications and treatment regimen and/or in discussing or reviewing the diagnosis of OSA, hypersomnia, the prognosis and treatment options. Pertinent laboratory and imaging test  results that were available during this visit with the patient were reviewed by me and considered in my medical decision making (see chart for details).

## 2020-03-26 NOTE — Patient Instructions (Signed)
Please continue using your CPAP regularly. While your insurance requires that you use CPAP at least 4 hours each night on 70% of the nights, I recommend, that you not skip any nights and use it throughout the night if you can. Getting used to CPAP and staying with the treatment long term does take time and patience and discipline. Untreated obstructive sleep apnea when it is moderate to severe can have an adverse impact on cardiovascular health and raise her risk for heart disease, arrhythmias, hypertension, congestive heart failure, stroke and diabetes. Untreated obstructive sleep apnea causes sleep disruption, nonrestorative sleep, and sleep deprivation. This can have an impact on your day to day functioning and cause daytime sleepiness and impairment of cognitive function, memory loss, mood disturbance, and problems focussing. Using CPAP regularly can improve these symptoms.  It is always good to see you.  Please continue using the Adderall up to twice daily, 10 mg.    Follow-up in 1 year.  Continue to work on weight loss.

## 2020-04-11 ENCOUNTER — Other Ambulatory Visit: Payer: Self-pay | Admitting: Family Medicine

## 2020-06-03 ENCOUNTER — Other Ambulatory Visit: Payer: Self-pay | Admitting: Neurology

## 2020-06-04 MED ORDER — AMPHETAMINE-DEXTROAMPHETAMINE 10 MG PO TABS: 10.0000 mg | ORAL_TABLET | Freq: Two times a day (BID) | ORAL | 0 refills | Status: DC

## 2020-08-07 ENCOUNTER — Telehealth: Payer: Self-pay | Admitting: Neurology

## 2020-08-07 DIAGNOSIS — G4719 Other hypersomnia: Secondary | ICD-10-CM

## 2020-08-07 DIAGNOSIS — G4733 Obstructive sleep apnea (adult) (pediatric): Secondary | ICD-10-CM

## 2020-08-07 DIAGNOSIS — Z9989 Dependence on other enabling machines and devices: Secondary | ICD-10-CM

## 2020-08-07 DIAGNOSIS — G4731 Primary central sleep apnea: Secondary | ICD-10-CM

## 2020-08-07 DIAGNOSIS — R635 Abnormal weight gain: Secondary | ICD-10-CM

## 2020-08-07 NOTE — Telephone Encounter (Signed)
Pt called wanting to speak to RN about changing the pressure on her cpap. Please advise.

## 2020-08-10 NOTE — Telephone Encounter (Signed)
I called pt. No answer, left a message asking pt to call me back.   

## 2020-08-11 NOTE — Telephone Encounter (Signed)
I reviewed her latest compliance data for the past 30 days, she is fully compliant with her CPAP of 11 cm, residual AHI is elevated at 11.8 and compared to June 2021 her AHI has increased.  In June, she had primarily central apneas but at this point she has more obstructive than central residual events.  I agree that her current CPAP pressure is not adequately treating her sleep apnea.  I would like to proceed with a home sleep test for reevaluation of her sleep apnea and a home sleep test can also distinguish between obstructive and central apneas.  For the home sleep test, please remind patient that she will not be using her machine at the time of testing.

## 2020-08-11 NOTE — Addendum Note (Signed)
Addended by: Huston Foley on: 08/11/2020 12:29 PM   Modules accepted: Orders

## 2020-08-11 NOTE — Telephone Encounter (Signed)
I called the pt and advised of Dr. Teofilo Pod recommendation. She verbalized understanding and is agreeable to pursuing HST.  She will wait to hear from the sleep lab and she understands she will not use her CPAP the night of the study.

## 2020-08-11 NOTE — Telephone Encounter (Signed)
I called the pt. She reports she does not feel like her CPAP is effectively treating her osa at this point. She sts has recently gained weight and feels like this could be contributing.  Pt reports she is getting up more times at night to pee and not feeling rested in the mornings.  Pt wanted to know if an updated sleep study should be completed since it has been a number of years since her last one?

## 2020-08-13 ENCOUNTER — Telehealth: Payer: Self-pay

## 2020-08-13 NOTE — Telephone Encounter (Signed)
Called to schedule HST, voicemail full  

## 2020-08-21 ENCOUNTER — Other Ambulatory Visit: Payer: Self-pay | Admitting: Neurology

## 2020-08-25 MED ORDER — AMPHETAMINE-DEXTROAMPHETAMINE 10 MG PO TABS: 10.0000 mg | ORAL_TABLET | Freq: Two times a day (BID) | ORAL | 0 refills | Status: DC

## 2020-08-28 ENCOUNTER — Ambulatory Visit: Payer: Self-pay | Attending: Internal Medicine

## 2020-08-28 DIAGNOSIS — Z23 Encounter for immunization: Secondary | ICD-10-CM

## 2020-08-28 NOTE — Progress Notes (Signed)
   Cov[facility] Vaccination [facility]  Name:  Annita Ratliff    MRN: [id number] DOB: 04-Oct-1966  08/28/2020  Ms. Ulrich was observed post Covid-19 immunization for 15 minutes without incident. She was provided with Vaccine Information Sheet and instruction to access the V-Safe system.   Ms. Marrocco was instructed to call 911 with any severe reactions post vaccine: Marland Kitchen Difficulty breathing  . Swelling of face and throat  . A fast heartbeat  . A bad rash all over body  . Dizziness and weakness

## 2020-10-05 ENCOUNTER — Ambulatory Visit (INDEPENDENT_AMBULATORY_CARE_PROVIDER_SITE_OTHER): Payer: No Typology Code available for payment source | Admitting: Neurology

## 2020-10-05 DIAGNOSIS — G4731 Primary central sleep apnea: Secondary | ICD-10-CM

## 2020-10-05 DIAGNOSIS — G4719 Other hypersomnia: Secondary | ICD-10-CM

## 2020-10-05 DIAGNOSIS — G4733 Obstructive sleep apnea (adult) (pediatric): Secondary | ICD-10-CM

## 2020-10-05 DIAGNOSIS — Z9989 Dependence on other enabling machines and devices: Secondary | ICD-10-CM

## 2020-10-05 DIAGNOSIS — R635 Abnormal weight gain: Secondary | ICD-10-CM

## 2020-10-14 NOTE — Progress Notes (Signed)
[name] [address] [place] [address] Phone: [phone] Subjective:   Alicia [name], am serving as a scribe for Dr. Antoine Primas. This visit occurred during the SARS-CoV-2 public health emergency.  Safety protocols were in place, including screening questions prior to the visit, additional usage of staff PPE, and extensive cleaning of exam room while observing appropriate contact time as indicated for disinfecting solutions.   I'm seeing this patient by the request  of:  Renaye Rakers, MD  CC: Thumb pain, foot pain, neck pain  [id number]   03/11/2020 54 year old female with degenerative disc disease and spinal stenosis and usually with no radicular symptoms.  Patient wants to discontinue the Effexor and will start titrating down.  Seems to be doing well but has not been working out as regularly.  We discussed the possibility of formal physical therapy but patient feels like she can do it on her own.  Discussed icing regimen.  Increase activity slowly.  Follow-up again in 4 to 8 weeks  Update 10/15/2020 Alicia [name] is a 54 y.o. female coming in with complaint of neck and arm pain.  Patient has had known cervical radiculopathy previously.  Was responding well to the Effexor and different injections previously.  Patient was starting to titrate off of the Effexor and was lost to follow-up after a May visit that was done virtually.  Patient states that she did slip on her deck on Monday and hurt her 1st MTP joint. Discoloration today in office.  Also having right thumb pain. Sleeps with wrist braces. Unable to bend IP joint for past 3 months. Pain radiates into UCL. Unable to play piano without pain. Feels like carpal tunnel or stenosis is getting worse. Does have constant pain in left trap.       Patient's last of neck imaging was from 2018.  Had an MRI showing moderate foraminal encroachment bilaterally at the C5-C6 which at that time  have shown some progression from 2015 imaging.  Patient did undergo an epidural in December 2018 and then again in June 2020.  Past Medical History:  Diagnosis Date  . Allergy   . Hypersomnia with sleep apnea, unspecified 05/23/2013  . OSA on CPAP 08/15/2013   Past Surgical History:  Procedure Laterality Date  . HYSTEROSCOPY WITH D & C    . WISDOM TOOTH EXTRACTION     Social History   Socioeconomic History  . Marital status: Single    Spouse name: Not on file  . Number of children: 0  . Years of education: Post Grad  . Highest education level: Not on file  Occupational History    Comment: [employer]  Tobacco Use  . Smoking status: Never Smoker  . Smokeless tobacco: Never Used  Substance and Sexual Activity  . Alcohol use: No  . Drug use: No  . Sexual activity: Never  Other Topics Concern  . Not on file  Social History Narrative   Patient lives at home alone.   Caffeine Use: occasionally soda   Social Determinants of Health   Financial Resource Strain: Not on file  Food Insecurity: Not on file  Transportation Needs: Not on file  Physical Activity: Not on file  Stress: Not on file  Social Connections: Not on file   Allergies  Allergen Reactions  . Latex   . Other     Seasonal   . Lubricants    Family History  Problem Relation Age of Onset  . Diabetes  Father        type 2  . Depression Father   . Dementia Father   . Narcolepsy Father        cataplexy  . High Cholesterol Father   . Stroke Maternal Grandmother   . Heart disease Maternal Grandfather       Current Outpatient Medications (Respiratory):  .  fexofenadine (ALLEGRA) 180 MG tablet, Take 180 mg by mouth daily. .  Triamcinolone Acetonide (NASACORT ALLERGY 24HR) 55 MCG/ACT AERO, Place 2 sprays into the nose 2 (two) times daily.    Current Outpatient Medications (Other):  .  amphetamine-dextroamphetamine (ADDERALL) 10 MG tablet, Take 1 tablet (10 mg total) by mouth 2 (two) times daily  with a meal. .  b complex vitamins capsule, Take 1 capsule by mouth daily. .  Bepotastine Besilate (BEPREVE) 1.5 % SOLN, Place 1 drop into both eyes once. .  betamethasone valerate lotion (VALISONE) 0.1 %, Apply 1 application topically daily. Marland Kitchen  desonide (DESOWEN) 0.05 % lotion, Apply 1 application topically 2 (two) times daily. Marland Kitchen  gabapentin (NEURONTIN) 100 MG capsule, TAKE 2 CAPSULES (200 MG TOTAL) BY MOUTH AT BEDTIME. Marland Kitchen  gabapentin (NEURONTIN) 100 MG capsule, Take 2 capsules (200 mg total) by mouth at bedtime. .  Multiple Vitamin (MULTIVITAMIN) tablet, Take 1 tablet by mouth daily. .  pimecrolimus (ELIDEL) 1 % cream, Apply 1 application topically 2 (two) times daily. Marland Kitchen  venlafaxine XR (EFFEXOR-XR) 37.5 MG 24 hr capsule, TAKE 1 CAPSULE BY MOUTH DAILY WITH BREAKFAST.   Reviewed prior external information including notes and imaging from  primary care provider As well as notes that were available from care everywhere and other healthcare systems.  Past medical history, social, surgical and family history all reviewed in electronic medical record.  No pertanent information unless stated regarding to the chief complaint.   Review of Systems:  No headache, visual changes, nausea, vomiting, diarrhea, constipation, dizziness, abdominal pain, skin rash, fevers, chills, night sweats, weight loss, swollen lymph nodes, body aches, joint swelling, chest pain, shortness of breath, mood changes. POSITIVE muscle aches  Objective  Blood pressure 118/72, pulse 72, height 5' 5.5" (1.664 m), last menstrual period 10/05/2017, SpO2 97 %.   General: No apparent distress alert and oriented x3 mood and affect normal, dressed appropriately.  HEENT: Pupils equal, extraocular movements intact  Respiratory: Patient's speak in full sentences and does not appear short of breath  Cardiovascular: No lower extremity edema, non tender, no erythema  Gait mild antalgic gait MSK: Right hand exam shows the patient is  having extreme difficulty with flexion of the thumb. Patient does have motion but does have triggering when been extending it again. Patient has difficulty doing it at the DIP. Patient though when isolating the DIP does have flexion noted. Patient does have this large trigger nodule noted at the A2 pulley. Mild CMC arthritic changes with positive grind.  Neck exam does have some loss of lordosis. Some mild positive Spurling's with the right sided with radicular symptoms in the C6 distribution. No significant weakness noted.  Right foot exam shows the patient does have significant bruising noted around the first MTP. The patient does have some limited range of motion and does have swelling of the first MTP. No crepitus noted. Mild positive squeeze test. Patient has full range of motion of the ankle and no tenderness over the medial or lateral malleolus.  Procedure: Real-time Ultrasound Guided Injection of right flexor tendon sheath first compartment Device: GE Logiq Q7 Ultrasound guided  injection is preferred based studies that show increased duration, increased effect, greater accuracy, decreased procedural pain, increased response rate, and decreased cost with ultrasound guided versus blind injection.  Verbal informed consent obtained.  Time-out conducted.  Noted no overlying erythema, induration, or other signs of local infection.  Skin prepped in a sterile fashion.  Local anesthesia: Topical Ethyl chloride.  With sterile technique and under real time ultrasound guidance: With a 25-gauge half inch needle injected with 0.5 cc of 0.5% Marcaine and 0.5 cc of Kenalog 40 mg/mL Completed without difficulty  Pain immediately resolved suggesting accurate placement of the medication.  Advised to call if fevers/chills, erythema, induration, drainage, or persistent bleeding.  Impression: Technically successful ultrasound guided injection.   Impression and Recommendations:     The above documentation  has been reviewed and is accurate and complete Judi Saa, DO

## 2020-10-15 ENCOUNTER — Ambulatory Visit: Payer: Self-pay

## 2020-10-15 ENCOUNTER — Ambulatory Visit (INDEPENDENT_AMBULATORY_CARE_PROVIDER_SITE_OTHER): Payer: No Typology Code available for payment source | Admitting: Family Medicine

## 2020-10-15 ENCOUNTER — Encounter: Payer: Self-pay | Admitting: Family Medicine

## 2020-10-15 ENCOUNTER — Ambulatory Visit (INDEPENDENT_AMBULATORY_CARE_PROVIDER_SITE_OTHER): Payer: No Typology Code available for payment source

## 2020-10-15 ENCOUNTER — Other Ambulatory Visit: Payer: Self-pay

## 2020-10-15 VITALS — BP 118/72 | HR 72 | Ht 65.5 in

## 2020-10-15 DIAGNOSIS — M5030 Other cervical disc degeneration, unspecified cervical region: Secondary | ICD-10-CM

## 2020-10-15 DIAGNOSIS — M79644 Pain in right finger(s): Secondary | ICD-10-CM

## 2020-10-15 DIAGNOSIS — G8929 Other chronic pain: Secondary | ICD-10-CM | POA: Diagnosis not present

## 2020-10-15 DIAGNOSIS — M79671 Pain in right foot: Secondary | ICD-10-CM

## 2020-10-15 DIAGNOSIS — M79672 Pain in left foot: Secondary | ICD-10-CM | POA: Diagnosis not present

## 2020-10-15 DIAGNOSIS — M65311 Trigger thumb, right thumb: Secondary | ICD-10-CM

## 2020-10-15 MED ORDER — GABAPENTIN 100 MG PO CAPS
200.0000 mg | ORAL_CAPSULE | Freq: Every day | ORAL | 3 refills | Status: DC
Start: 2020-10-15 — End: 2021-06-29

## 2020-10-15 NOTE — Patient Instructions (Addendum)
Rigid shoes Arnica lotion Wear the brace  right foot xray Gabapentin refilled  Continue the exercises  See me again in 4-5 weeks

## 2020-10-15 NOTE — Assessment & Plan Note (Signed)
Right foot injury. Questionable fracture. Definitely though does have a capsulitis. With the amount of bruising discussed topical anti-inflammatories, rigid shoe. Patient does have hiking boot and did not want a cam walker today. Discussed icing regimen. Discussed that there is a fracture that this can take 4 to 8 weeks to heal. Patient is able to ambulate at this moment though which is good sign follow-up again in 4 to 6 weeks

## 2020-10-15 NOTE — Assessment & Plan Note (Signed)
Known severe arthritic changes. We will get a new x-ray to further evaluate. Patient has done relatively well conservatively for years. Patient has also responded to epidurals but would like to hold at this time. We discussed potentially increasing the gabapentin but will continue with the 200 at this time. Patient will continue with the Effexor as well. Patient will see me again in 4 to 6 weeks and we will further evaluate. Has responded to physical therapy in the past and will consider this as well

## 2020-10-15 NOTE — Assessment & Plan Note (Signed)
This was quite severe and severe enough that patient was actually stuck more in an extended than flexed position and had difficulty with any type of movement of the DIP. Patient given injection in hopes that this will be beneficial. Discussed potential bracing but then the range of motion. Because of the mild weakness of the DIP even though when isolating it patient did have flexion we could consider imaging if necessary if this does not improve. Patient likely though will do relatively well and follow-up with me again 4 to 6 weeks

## 2020-10-18 NOTE — Procedures (Signed)
   [facility]  HOME SLEEP TEST (Watch PAT)  STUDY DATE: 10/06/20  DOB: Apr 05, 1966  MRN: [id number]  ORDERING CLINICIAN: Huston Foley, MD, PhD   REFERRING CLINICIAN: Renaye Rakers, MD   CLINICAL INFORMATION/HISTORY: 54 year old woman with a history of allergies, hyperlipidemia, and obstructive sleep apnea on CPAP with residual daytime somnolence, who presents for re-evaluation of her sleep apnea. She has been on CPAP of 11 cm with compliance.  BMI: 28.4 kg/m  FINDINGS:   Total Record Time (hours, min): 8 H 2 min  Total Sleep Time (hours, min):  6 H 5 min   Percent REM (%):    6.71 %   Calculated pAHI (per hour):  64.4      REM pAHI:    Insufficient REM    NREM pAHI: Insufficient REM    Oxygen Saturation (%) Mean: 93  Minimum oxygen saturation (%):        79   O2 Saturation Range (%): 79-100  O2Saturation (minutes) <=88%: 14.5 min   Pulse Mean (bpm):    93  Pulse Range (58-99)   IMPRESSION: OSA (obstructive sleep apnea), severe  RECOMMENDATION:  This home sleep test demonstrates severe obstructive sleep apnea with a total AHI of 64.4/hour and O2 nadir of 79%. Ongoing treatment with positive airway pressure is recommended. The patient will be advised to start autoPAP therapy with new equipment. A full night titration study may be considered to optimize treatment settings, if needed down the road. Please note that untreated obstructive sleep apnea may carry additional perioperative morbidity. Patients with significant obstructive sleep apnea should receive perioperative PAP therapy and the surgeons and particularly the anesthesiologist should be informed of the diagnosis and the severity of the sleep disordered breathing. The patient should be cautioned not to drive, work at heights, or operate dangerous or heavy equipment when tired or sleepy. Review and reiteration of good sleep hygiene measures should be pursued with any patient. Other causes of the patient's  symptoms, including circadian rhythm disturbances, an underlying mood disorder, medication effect and/or an underlying medical problem cannot be ruled out based on this test. Clinical correlation is recommended. The patient will be seen in follow up in sleep clinic at [facility].  I certify that I have reviewed the raw data recording prior to the issuance of this report in accordance with the standards of the American Academy of Sleep Medicine (AASM).   INTERPRETING PHYSICIAN:    Huston Foley, MD, PhD  Board Certified in Neurology and Sleep Medicine [facility] [address] [place], [place] [address] [phone]

## 2020-10-18 NOTE — Addendum Note (Signed)
Addended by: Huston Foley on: 10/18/2020 08:17 PM   Modules accepted: Orders

## 2020-10-18 NOTE — Progress Notes (Signed)
Patient had a recent HST for re-evaluation of her OSA. She has been on CPAP of 11 cm with good compliance but residual events. HST from 10/06/20 showed severe OSA. If she is eligible for new equipment, I will write for an AutoPap machine.  We may have to push the pressure a little higher than her current CPAP of 11 cm.  If she is not eligible for a new machine, we can also increase her pressure on the current machine.  For now, I will write for an AutoPap machine.  She will need a follow-up appointment within 3 months after starting new equipment.  Please remind patient of the need for compliance and the follow-up appointment.

## 2020-10-19 ENCOUNTER — Telehealth: Payer: Self-pay

## 2020-10-19 NOTE — Telephone Encounter (Signed)
-----   Message from Huston Foley, MD sent at 10/18/2020  8:17 PM EST ----- Patient had a recent HST for re-evaluation of her OSA. She has been on CPAP of 11 cm with good compliance but residual events. HST from 10/06/20 showed severe OSA. If she is eligible for new equipment, I will write for an AutoPap machine.  We may have to push the pressure a little higher than her current CPAP of 11 cm.  If she is not eligible for a new machine, we can also increase her pressure on the current machine.  For now, I will write for an AutoPap machine.  She will need a follow-up appointment within 3 months after starting new equipment.  Please remind patient of the need for compliance and the follow-up appointment.

## 2020-10-19 NOTE — Telephone Encounter (Signed)
I called pt. I discussed her sleep study results and recommendations. Pt's current cpap is about 6.52 years old and not eligible for a new machine. I will send the auto pap order to [facility] and hopefully they can adjust the pressure on her current cpap to an auto setting. A follow up appt was made for 01/14/21 at 1:00pm. Pt verbalized understanding of results and of new appt date and time. Pt had no questions at this time but was encouraged to call back if questions arise. Order for pressure change sent to [facility] via community message. Confirmation received that the order transmitted was successful.

## 2020-10-27 ENCOUNTER — Other Ambulatory Visit: Payer: Self-pay | Admitting: Gastroenterology

## 2020-11-18 NOTE — Progress Notes (Unsigned)
[name] [address] [place] [address] Phone: [phone] Subjective:   Alicia [name], am serving as a scribe for Dr. Antoine Primas. This visit occurred during the SARS-CoV-2 public health emergency.  Safety protocols were in place, including screening questions prior to the visit, additional usage of staff PPE, and extensive cleaning of exam room while observing appropriate contact time as indicated for disinfecting solutions.   I'm seeing this patient by the request  of:  Renaye Rakers, MD  CC: right thumb and foot pain follow up   [id number]   10/15/2020 \\Right  foot injury. Questionable fracture. Definitely though does have a capsulitis. With the amount of bruising discussed topical anti-inflammatories, rigid shoe. Patient does have hiking boot and did not want a cam walker today. Discussed icing regimen. Discussed that there is a fracture that this can take 4 to 8 weeks to heal. Patient is able to ambulate at this moment though which is good sign follow-up again in 4 to 6 weeks  This was quite severe and severe enough that patient was actually stuck more in an extended than flexed position and had difficulty with any type of movement of the DIP. Patient given injection in hopes that this will be beneficial. Discussed potential bracing but then the range of motion. Because of the mild weakness of the DIP even though when isolating it patient did have flexion we could consider imaging if necessary if this does not improve. Patient likely though will do relatively well and follow-up with me again 4 to 6 weeks  Update 11/19/2020 Alicia [name] is a 55 y.o. female coming in with complaint of right thumb and right heel pain. Patient states that her right thumb is doing much better. Feels stiff.  Patient though is moving significantly better.  Is able to work much more regularly with this pain.  Patient has been wearing hiking boots since last  visit. Pain has improved. Barefoot and extreme extension increases her pain. Feels tightness in plantar fascia and in great toe. Patient states that it is still a dull, throbbing aching pain.  States that some of the bruising she had noted has completely resolved.  Just wondering what else she can potentially do.     Past Medical History:  Diagnosis Date  . Allergy   . Hypersomnia with sleep apnea, unspecified 05/23/2013  . OSA on CPAP 08/15/2013   Past Surgical History:  Procedure Laterality Date  . HYSTEROSCOPY WITH D & C    . WISDOM TOOTH EXTRACTION     Social History   Socioeconomic History  . Marital status: Single    Spouse name: Not on file  . Number of children: 0  . Years of education: Post Grad  . Highest education level: Not on file  Occupational History    Comment: [employer]  Tobacco Use  . Smoking status: Never Smoker  . Smokeless tobacco: Never Used  Substance and Sexual Activity  . Alcohol use: No  . Drug use: No  . Sexual activity: Never  Other Topics Concern  . Not on file  Social History Narrative   Patient lives at home alone.   Caffeine Use: occasionally soda   Social Determinants of Health   Financial Resource Strain: Not on file  Food Insecurity: Not on file  Transportation Needs: Not on file  Physical Activity: Not on file  Stress: Not on file  Social Connections: Not on file   Allergies  Allergen Reactions  .  Latex   . Other     Seasonal   . Lubricants    Family History  Problem Relation Age of Onset  . Diabetes Father        type 2  . Depression Father   . Dementia Father   . Narcolepsy Father        cataplexy  . High Cholesterol Father   . Stroke Maternal Grandmother   . Heart disease Maternal Grandfather       Current Outpatient Medications (Respiratory):  .  fexofenadine (ALLEGRA) 180 MG tablet, Take 180 mg by mouth daily. Marland Kitchen  triamcinolone (NASACORT) 55 MCG/ACT AERO nasal inhaler, Place 2 sprays into the nose  2 (two) times daily.    Current Outpatient Medications (Other):  .  amphetamine-dextroamphetamine (ADDERALL) 10 MG tablet, Take 1 tablet (10 mg total) by mouth 2 (two) times daily with a meal. .  b complex vitamins capsule, Take 1 capsule by mouth daily. .  Bepotastine Besilate 1.5 % SOLN, Place 1 drop into both eyes once. .  betamethasone valerate lotion (VALISONE) 0.1 %, Apply 1 application topically daily. Marland Kitchen  desonide (DESOWEN) 0.05 % lotion, Apply 1 application topically 2 (two) times daily. Marland Kitchen  gabapentin (NEURONTIN) 100 MG capsule, TAKE 2 CAPSULES (200 MG TOTAL) BY MOUTH AT BEDTIME. Marland Kitchen  gabapentin (NEURONTIN) 100 MG capsule, Take 2 capsules (200 mg total) by mouth at bedtime. .  Multiple Vitamin (MULTIVITAMIN) tablet, Take 1 tablet by mouth daily. .  pimecrolimus (ELIDEL) 1 % cream, Apply 1 application topically 2 (two) times daily. Marland Kitchen  venlafaxine XR (EFFEXOR-XR) 37.5 MG 24 hr capsule, TAKE 1 CAPSULE BY MOUTH DAILY WITH BREAKFAST.   Reviewed prior external information including notes and imaging from  primary care provider As well as notes that were available from care everywhere and other healthcare systems.  Past medical history, social, surgical and family history all reviewed in electronic medical record.  No pertanent information unless stated regarding to the chief complaint.   Review of Systems:  No headache, visual changes, nausea, vomiting, diarrhea, constipation, dizziness, abdominal pain, skin rash, fevers, chills, night sweats, weight loss, swollen lymph nodes, body aches, joint swelling, chest pain, shortness of breath, mood changes. POSITIVE muscle aches  Objective  Blood pressure 122/74, pulse 76, height 5' 5.5" (1.664 m), weight 220 lb (99.8 kg), last menstrual period 10/05/2017, SpO2 97 %.   General: No apparent distress alert and oriented x3 mood and affect normal, dressed appropriately.  HEENT: Pupils equal, extraocular movements intact  Respiratory: Patient's  speak in full sentences and does not appear short of breath  Cardiovascular: No lower extremity edema, non tender, no erythema  Gait normal with good balance and coordination.  MSK: Right thumb shows the patient still has a trigger nodule noted by the patient is able to move the thumb now.  Patient does not have any triggering occurring.  Still tender over the nodule itself.  Foot exam still shows significant pes planus.  Patient does have hallux rigidus of the first toe.  Significant decrease in swelling and the inflammation that was seen previously.  Still tender over the first metatarsal and very minimal over the second metatarsal or phalanx.  Limited musculoskeletal ultrasound was performed and interpreted by Judi Saa  Limited ultrasound of patient's first MCP shows the arthritic changes noted.  Patient though hypoechoic changes does seem to be somewhat improved from previous exam but still does have enlargement of the capsule noted.  Questionable uric acid deposits are  noted. Impression: First toe arthritis.    Impression and Recommendations:     The above documentation has been reviewed and is accurate and complete Judi Saa, DO

## 2020-11-19 ENCOUNTER — Encounter: Payer: Self-pay | Admitting: Family Medicine

## 2020-11-19 ENCOUNTER — Ambulatory Visit (INDEPENDENT_AMBULATORY_CARE_PROVIDER_SITE_OTHER): Payer: No Typology Code available for payment source

## 2020-11-19 ENCOUNTER — Other Ambulatory Visit: Payer: Self-pay

## 2020-11-19 ENCOUNTER — Ambulatory Visit (INDEPENDENT_AMBULATORY_CARE_PROVIDER_SITE_OTHER): Payer: No Typology Code available for payment source | Admitting: Family Medicine

## 2020-11-19 ENCOUNTER — Ambulatory Visit: Payer: Self-pay

## 2020-11-19 VITALS — BP 122/74 | HR 76 | Ht 65.5 in | Wt 220.0 lb

## 2020-11-19 DIAGNOSIS — M79671 Pain in right foot: Secondary | ICD-10-CM

## 2020-11-19 DIAGNOSIS — M79644 Pain in right finger(s): Secondary | ICD-10-CM | POA: Diagnosis not present

## 2020-11-19 DIAGNOSIS — G8929 Other chronic pain: Secondary | ICD-10-CM

## 2020-11-19 DIAGNOSIS — M65311 Trigger thumb, right thumb: Secondary | ICD-10-CM

## 2020-11-19 NOTE — Assessment & Plan Note (Signed)
Patient had a small avulsion of the phalanx of the second toe but it seems to be doing relatively well.  Still having no pain more in the first metatarsal.  Discussed a carbon fiber plate and may need potentially custom orthotics in the long run.  Continue to wear more of a rigid soled shoe.  Patient declined any injection.  I do feel that further work-up including laboratory work-up may be beneficial to rule out things such as gout, hypercalcemia or other things.  Patient wanted to hold at this time and may take discussed with patient's primary care provider.  Patient will follow up with me again in 1 to 2 months

## 2020-11-19 NOTE — Patient Instructions (Signed)
Carbon fiber plate in shoe Xray of right foot and thumb Thumb is doing better-may need other injections if things change Vit D 1000IU daily When you follow up with primary ask them about work up of Ca and Uric Acid See me again in 1-2 months

## 2020-11-19 NOTE — Assessment & Plan Note (Signed)
Patient has made improvement.  We will get x-rays to further evaluate but seems to be doing relatively well.  No significant changes otherwise.

## 2020-11-27 ENCOUNTER — Other Ambulatory Visit: Payer: Self-pay

## 2020-11-27 ENCOUNTER — Encounter (HOSPITAL_COMMUNITY): Payer: Self-pay | Admitting: Gastroenterology

## 2020-12-01 ENCOUNTER — Other Ambulatory Visit: Payer: Self-pay | Admitting: Neurology

## 2020-12-01 ENCOUNTER — Other Ambulatory Visit (HOSPITAL_COMMUNITY)
Admission: RE | Admit: 2020-12-01 | Discharge: 2020-12-01 | Disposition: A | Discharge status: Home / Self Care | Payer: 59 | Source: Ambulatory Visit | Attending: Gastroenterology | Admitting: Gastroenterology

## 2020-12-01 DIAGNOSIS — Z01812 Encounter for preprocedural laboratory examination: Secondary | ICD-10-CM | POA: Insufficient documentation

## 2020-12-01 DIAGNOSIS — Z20822 Contact with and (suspected) exposure to covid-19: Secondary | ICD-10-CM | POA: Diagnosis not present

## 2020-12-01 LAB — SARS CORONAVIRUS 2 (TAT 6-24 HRS): SARS Coronavirus 2: NEGATIVE

## 2020-12-01 MED ORDER — AMPHETAMINE-DEXTROAMPHETAMINE 10 MG PO TABS: 10.0000 mg | ORAL_TABLET | Freq: Two times a day (BID) | ORAL | 0 refills | Status: DC

## 2020-12-03 NOTE — Anesthesia Preprocedure Evaluation (Addendum)
Anesthesia Evaluation  Patient identified by MRN, date of birth, ID band Patient awake    Reviewed: Allergy & Precautions, NPO status , Patient's Chart, lab work & pertinent test results  Airway Mallampati: I       Dental no notable dental hx.    Pulmonary sleep apnea and Continuous Positive Airway Pressure Ventilation ,    Pulmonary exam normal        Cardiovascular negative cardio ROS Normal cardiovascular exam     Neuro/Psych  Neuromuscular disease negative psych ROS   GI/Hepatic Neg liver ROS,   Endo/Other  negative endocrine ROS  Renal/GU negative Renal ROS     Musculoskeletal   Abdominal (+) + obese,   Peds  Hematology negative hematology ROS (+)   Anesthesia Other Findings   Reproductive/Obstetrics                            Anesthesia Physical Anesthesia Plan  ASA: II  Anesthesia Plan: MAC   Post-op Pain Management:    Induction:   PONV Risk Score and Plan: 2 and Propofol infusion  Airway Management Planned: Natural Airway and Mask  Additional Equipment: None  Intra-op Plan:   Post-operative Plan:   Informed Consent: I have reviewed the patients History and Physical, chart, labs and discussed the procedure including the risks, benefits and alternatives for the proposed anesthesia with the patient or authorized representative who has indicated his/her understanding and acceptance.       Plan Discussed with: CRNA  Anesthesia Plan Comments:        Anesthesia Quick Evaluation

## 2020-12-04 ENCOUNTER — Ambulatory Visit (HOSPITAL_COMMUNITY): Payer: 59 | Admitting: Anesthesiology

## 2020-12-04 ENCOUNTER — Other Ambulatory Visit: Payer: Self-pay

## 2020-12-04 ENCOUNTER — Encounter (HOSPITAL_COMMUNITY)
Admission: RE | Disposition: A | Discharge status: Home / Self Care | Payer: Self-pay | Source: Home / Self Care | Attending: Gastroenterology

## 2020-12-04 ENCOUNTER — Encounter (HOSPITAL_COMMUNITY): Payer: Self-pay | Admitting: Gastroenterology

## 2020-12-04 ENCOUNTER — Ambulatory Visit (HOSPITAL_COMMUNITY)
Admission: RE | Admit: 2020-12-04 | Discharge: 2020-12-04 | Disposition: A | Discharge status: Home / Self Care | Payer: 59 | Attending: Gastroenterology | Admitting: Gastroenterology

## 2020-12-04 DIAGNOSIS — K5730 Diverticulosis of large intestine without perforation or abscess without bleeding: Secondary | ICD-10-CM | POA: Diagnosis not present

## 2020-12-04 DIAGNOSIS — Z91040 Latex allergy status: Secondary | ICD-10-CM | POA: Insufficient documentation

## 2020-12-04 DIAGNOSIS — Z1211 Encounter for screening for malignant neoplasm of colon: Secondary | ICD-10-CM | POA: Insufficient documentation

## 2020-12-04 HISTORY — PX: COLONOSCOPY WITH PROPOFOL: SHX5780

## 2020-12-04 HISTORY — DX: Presence of spectacles and contact lenses: Z97.3

## 2020-12-04 SURGERY — COLONOSCOPY WITH PROPOFOL
Anesthesia: Monitor Anesthesia Care

## 2020-12-04 MED ORDER — LACTATED RINGERS IV SOLN: INTRAVENOUS | Status: DC

## 2020-12-04 MED ORDER — PROPOFOL 10 MG/ML IV BOLUS
INTRAVENOUS | Status: AC
  Filled 2020-12-04: qty 40

## 2020-12-04 MED ORDER — PROPOFOL 1000 MG/100ML IV EMUL
INTRAVENOUS | Status: AC
  Filled 2020-12-04: qty 100

## 2020-12-04 MED ORDER — PROPOFOL 10 MG/ML IV BOLUS
INTRAVENOUS | Status: DC | PRN
  Administered 2020-12-04 (×9): 20 mg via INTRAVENOUS
  Administered 2020-12-04: 30 mg via INTRAVENOUS
  Administered 2020-12-04 (×4): 20 mg via INTRAVENOUS
  Administered 2020-12-04: 40 mg via INTRAVENOUS
  Administered 2020-12-04 (×2): 20 mg via INTRAVENOUS

## 2020-12-04 MED ORDER — SODIUM CHLORIDE 0.9 % IV SOLN: INTRAVENOUS | Status: DC

## 2020-12-04 SURGICAL SUPPLY — 22 items

## 2020-12-04 NOTE — Anesthesia Postprocedure Evaluation (Signed)
Anesthesia Post Note  Patient: Alicia [name]  Procedure(s) Performed: COLONOSCOPY WITH PROPOFOL (N/A )     Patient location during evaluation: Endoscopy Anesthesia Type: MAC Level of consciousness: sedated and awake Pain management: pain level controlled Vital Signs Assessment: post-procedure vital signs reviewed and stable Respiratory status: spontaneous breathing Cardiovascular status: stable Postop Assessment: no apparent nausea or vomiting Anesthetic complications: no   No complications documented.  Last Vitals:  Vitals:   12/04/20 0701 12/04/20 0819  BP: 107/67 104/69  Pulse: (!) 59 67  Resp: 15 15  Temp: 36.7 C 36.6 C  SpO2: 99% 100%    Last Pain:  Vitals:   12/04/20 0819  TempSrc: Axillary  PainSc: 0-No pain                 Huston Foley

## 2020-12-04 NOTE — Anesthesia Procedure Notes (Signed)
Date/Time: 12/04/2020 7:32 AM Performed by: Jhonnie Garner, CRNA Pre-anesthesia Checklist: Patient identified, Emergency Drugs available, Suction available and Patient being monitored Patient Re-evaluated:Patient Re-evaluated prior to induction Oxygen Delivery Method: Simple face mask

## 2020-12-04 NOTE — Discharge Instructions (Signed)

## 2020-12-04 NOTE — H&P (Signed)
  Alicia [name]   HPI: This 55 year old black female presents to the office for colorectal cancer screening. She has 1-2 BM's per day with no obvious blood or mucus in the stool. She has a good appetite and has gained 25 pounds over the last 18 months. She denies having any complaints of abdominal pain, nausea, vomiting, acid reflux, dysphagia or odynophagia. She denies having a family history of colon cancer, celiac sprue or IBD.     Past Medical History:  Diagnosis Date  . Allergy   . Hypersomnia with sleep apnea, unspecified 05/23/2013  . OSA on CPAP 08/15/2013  . Wears glasses     Past Surgical History:  Procedure Laterality Date  . HYSTEROSCOPY WITH D & C    . WISDOM TOOTH EXTRACTION      Family History  Problem Relation Age of Onset  . Diabetes Father        type 2  . Depression Father   . Dementia Father   . Narcolepsy Father        cataplexy  . High Cholesterol Father   . Stroke Maternal Grandmother   . Heart disease Maternal Grandfather     Social History:  reports that she has never smoked. She has never used smokeless tobacco. She reports that she does not drink alcohol and does not use drugs.  Allergies:  Allergies  Allergen Reactions  . Latex Itching and Rash  . Other     Seasonal   . Lubricants Itching and Rash    Medications:  Scheduled:  Continuous: . lactated ringers 20 mL/hr at 12/04/20 0713    No results found for this or any previous visit (from the past 24 hour(s)).   No results found.  ROS:  As stated above in the HPI otherwise negative.  Blood pressure 107/67, pulse (!) 59, temperature 98 F (36.7 C), temperature source Oral, resp. rate 15, height 5' 5.5" (1.664 m), weight 96.6 kg, last menstrual period 10/05/2017, SpO2 99 %.    PE: Gen: NAD, Alert and Oriented HEENT:  Creek/AT, EOMI Neck: Supple, no LAD Lungs: CTA Bilaterally CV: RRR without M/G/R ABD: Soft, NTND, +BS Ext: No C/C/E  Assessment/Plan: 1) Screening  colonoscopy.  Merridith Dershem D 12/04/2020, 7:19 AM

## 2020-12-04 NOTE — Op Note (Signed)
[facility] Patient Name: Alicia [name] Procedure Date: 12/04/2020 MRN: [id number] Attending MD: Jeani Hawking , MD Date of Birth: August 10, 1966 CSN: [id number] Age: 55 Admit Type: Outpatient Procedure:                Colonoscopy Indications:              Screening for colorectal malignant neoplasm Providers:                Jeani Hawking, MD, Dwain Sarna, RN, Sunday Corn                            [place], Technician Referring MD:              Medicines:                Propofol per Anesthesia Complications:            No immediate complications. Estimated Blood Loss:     Estimated blood loss: none. Procedure:                Pre-Anesthesia Assessment:                           - Prior to the procedure, a History and Physical                            was performed, and patient medications and                            allergies were reviewed. The patient's tolerance of                            previous anesthesia was also reviewed. The risks                            and benefits of the procedure and the sedation                            options and risks were discussed with the patient.                            All questions were answered, and informed consent                            was obtained. Prior Anticoagulants: The patient has                            taken no previous anticoagulant or antiplatelet                            agents. ASA Grade Assessment: III - A patient with                            severe systemic disease. After reviewing the risks  and benefits, the patient was deemed in                            satisfactory condition to undergo the procedure.                           - Sedation was administered by an anesthesia                            professional. Deep sedation was attained.                           After obtaining informed consent, the colonoscope                            was passed  under direct vision. Throughout the                            procedure, the patient's blood pressure, pulse, and                            oxygen saturations were monitored continuously. The                            CF-HQ190L ([id number] Olympus colonoscope was                            introduced through the anus and advanced to the the                            cecum, identified by appendiceal orifice and                            ileocecal valve. The colonoscopy was technically                            difficult and complex due to poor bowel prep.                            Successful completion of the procedure was aided by                            lavage. The patient tolerated the procedure well.                            The quality of the bowel preparation was adequate                            to identify polyps. The ileocecal valve,                            appendiceal orifice, and rectum were photographed. Scope In: 7:38:50 AM Scope Out: 8:12:43 AM Scope Withdrawal Time: 0 hours 22 minutes 24 seconds  Total Procedure Duration: 0  hours 33 minutes 53 seconds  Findings:      A few small-mouthed diverticula were found in the sigmoid colon and       descending colon.      The prep was poor as there was a significant amount of retained       vegetable matter. Extensive washing was performed and several times the       colonosocpe clogged, however, this was cleared. Adequate to excellent       view of the mucosa were obtained. Impression:               - Diverticulosis in the sigmoid colon and in the                            descending colon.                           - No specimens collected. Moderate Sedation:      Not Applicable - Patient had care per Anesthesia. Recommendation:           - Patient has a contact number available for                            emergencies. The signs and symptoms of potential                            delayed complications were  discussed with the                            patient. Return to normal activities tomorrow.                            Written discharge instructions were provided to the                            patient.                           - Resume previous diet.                           - Continue present medications.                           - Repeat colonoscopy in 1 year for screening                            purposes as a result of the poor prep. Procedure Code(s):        --- Professional ---                           [phone], Colonoscopy, flexible; diagnostic, including                            collection of specimen(s) by brushing or washing,  when performed (separate procedure) Diagnosis Code(s):        --- Professional ---                           Z12.11, Encounter for screening for malignant                            neoplasm of colon                           K57.30, Diverticulosis of large intestine without                            perforation or abscess without bleeding CPT copyright 2019 American Medical Association. All rights reserved. The codes documented in this report are preliminary and upon coder review may  be revised to meet current compliance requirements. Jeani Hawking, MD Jeani Hawking, MD 12/04/2020 8:20:36 AM This report has been signed electronically. Number of Addenda: 0

## 2020-12-04 NOTE — Transfer of Care (Signed)
Immediate Anesthesia Transfer of Care Note  Patient: Alicia [name]  Procedure(s) Performed: COLONOSCOPY WITH PROPOFOL (N/A )  Patient Location: PACU  Anesthesia Type:MAC  Level of Consciousness: awake, alert  and oriented  Airway & Oxygen Therapy: Patient Spontanous Breathing and Patient connected to face mask oxygen  Post-op Assessment: Report given to RN and Post -op Vital signs reviewed and stable  Post vital signs: Reviewed and stable  Last Vitals:  Vitals Value Taken Time  BP    Temp    Pulse    Resp    SpO2      Last Pain:  Vitals:   12/04/20 0701  TempSrc: Oral  PainSc: 0-No pain         Complications: No complications documented.

## 2020-12-07 ENCOUNTER — Encounter (HOSPITAL_COMMUNITY): Payer: Self-pay | Admitting: Gastroenterology

## 2020-12-07 ENCOUNTER — Telehealth: Payer: Self-pay | Admitting: Oncology

## 2020-12-07 NOTE — Telephone Encounter (Signed)
Received a call from Ms. Alicia [name] to schedule an appt for the high risk breast clinic. She has been cld and scheduled to see Dr. Darnelle Catalan on 3/14 at 4pm w/labs. Pt aware to arrive 20 minutes early.   I cld and spoke to [place] at [facility] to obtain the pt's mammogram report for the appt. She will have them faxed to our office.

## 2020-12-21 ENCOUNTER — Other Ambulatory Visit: Payer: Self-pay | Admitting: Neurology

## 2020-12-22 MED ORDER — AMPHETAMINE-DEXTROAMPHETAMINE 10 MG PO TABS: 10.0000 mg | ORAL_TABLET | Freq: Two times a day (BID) | ORAL | 0 refills | Status: DC

## 2020-12-23 ENCOUNTER — Other Ambulatory Visit: Payer: Self-pay | Admitting: Family Medicine

## 2020-12-24 ENCOUNTER — Encounter: Payer: Self-pay | Admitting: Oncology

## 2020-12-30 NOTE — Progress Notes (Signed)
[name] [address] [place] [address] Phone: [phone] Subjective:   I Alicia [name] am serving as a Neurosurgeon for Dr. Antoine Primas.  This visit occurred during the SARS-CoV-2 public health emergency.  Safety protocols were in place, including screening questions prior to the visit, additional usage of staff PPE, and extensive cleaning of exam room while observing appropriate contact time as indicated for disinfecting solutions.   I'm seeing this patient by the request  of:  Renaye Rakers, MD  CC: right thumb and knee pain, foot pain follow up   [id number]   11/19/2020 Patient has made improvement.  We will get x-rays to further evaluate but seems to be doing relatively well.  No significant changes otherwise.  Patient had a small avulsion of the phalanx of the second toe but it seems to be doing relatively well.  Still having no pain more in the first metatarsal.  Discussed a carbon fiber plate and may need potentially custom orthotics in the long run.  Continue to wear more of a rigid soled shoe.  Patient declined any injection.  I do feel that further work-up including laboratory work-up may be beneficial to rule out things such as gout, hypercalcemia or other things.  Patient wanted to hold at this time and may take discussed with patient's primary care provider.  Patient will follow up with me again in 1 to 2 months  12/31/2020 Alicia [name] is a 55 y.o. female coming in with complaint of right thumb and foot pain. Patient states the thumb is doing well. ROM has improved. States she still wears hiking shoes. Some pain at times on the bottom of the foot but she is making progress.  Patient states that the thumb is much better and is not having as much problems with holding her book anymore.      Past Medical History:  Diagnosis Date  . Allergy   . Hypersomnia with sleep apnea, unspecified 05/23/2013  . OSA on CPAP 08/15/2013  .  Wears glasses    Past Surgical History:  Procedure Laterality Date  . COLONOSCOPY WITH PROPOFOL N/A 12/04/2020   Procedure: COLONOSCOPY WITH PROPOFOL;  Surgeon: Jeani Hawking, MD;  Location: [facility];  Service: Endoscopy;  Laterality: N/A;  . HYSTEROSCOPY WITH D & C    . WISDOM TOOTH EXTRACTION     Social History   Socioeconomic History  . Marital status: Single    Spouse name: Not on file  . Number of children: 0  . Years of education: Post Grad  . Highest education level: Not on file  Occupational History    Comment: [employer]  Tobacco Use  . Smoking status: Never Smoker  . Smokeless tobacco: Never Used  Substance and Sexual Activity  . Alcohol use: No  . Drug use: No  . Sexual activity: Never  Other Topics Concern  . Not on file  Social History Narrative   Patient lives at home alone.   Caffeine Use: occasionally soda   Social Determinants of Health   Financial Resource Strain: Not on file  Food Insecurity: Not on file  Transportation Needs: Not on file  Physical Activity: Not on file  Stress: Not on file  Social Connections: Not on file   Allergies  Allergen Reactions  . Latex Itching and Rash  . Other     Seasonal   . Lubricants Itching and Rash   Family History  Problem Relation Age of Onset  . Diabetes  Father        type 2  . Depression Father   . Dementia Father   . Narcolepsy Father        cataplexy  . High Cholesterol Father   . Stroke Maternal Grandmother   . Heart disease Maternal Grandfather      Current Outpatient Medications (Cardiovascular):  .  rosuvastatin (CRESTOR) 5 MG tablet, Take 5 mg by mouth daily.  Current Outpatient Medications (Respiratory):  .  fexofenadine (ALLEGRA) 180 MG tablet, Take 180 mg by mouth daily. Marland Kitchen  triamcinolone (NASACORT) 55 MCG/ACT AERO nasal inhaler, Place 2 sprays into the nose 2 (two) times daily as needed (allergies).    Current Outpatient Medications (Other):  .   amphetamine-dextroamphetamine (ADDERALL) 10 MG tablet, Take 1 tablet (10 mg total) by mouth 2 (two) times daily with a meal. .  b complex vitamins capsule, Take 1 capsule by mouth daily. .  Bepotastine Besilate 1.5 % SOLN, Place 1 drop into both eyes daily as needed (allergies). .  betamethasone valerate lotion (VALISONE) 0.1 %, Apply 1 application topically daily as needed for irritation (scalp). Marland Kitchen  desonide (DESOWEN) 0.05 % lotion, Apply 1 application topically 2 (two) times daily as needed (irritation). .  gabapentin (NEURONTIN) 100 MG capsule, Take 2 capsules (200 mg total) by mouth at bedtime. (Patient taking differently: Take 200 mg by mouth at bedtime as needed (pain).) .  Multiple Vitamin (MULTIVITAMIN) tablet, Take 1 tablet by mouth daily. .  pimecrolimus (ELIDEL) 1 % cream, Apply 1 application topically 2 (two) times daily as needed (irritation). .  Rhubarb (ESTROVEN MENOPAUSE RELIEF PO), Take 1 capsule by mouth daily. Marland Kitchen  venlafaxine XR (EFFEXOR-XR) 37.5 MG 24 hr capsule, TAKE 1 CAPSULE BY MOUTH DAILY WITH BREAKFAST. (Patient taking differently: Take 37.5 mg by mouth daily with breakfast.) .  vitamin E 180 MG (400 UNITS) capsule, Take 400 Units by mouth daily.   Reviewed prior external information including notes and imaging from  primary care provider As well as notes that were available from care everywhere and other healthcare systems.  Past medical history, social, surgical and family history all reviewed in electronic medical record.  No pertanent information unless stated regarding to the chief complaint.   Review of Systems:  No headache, visual changes, nausea, vomiting, diarrhea, constipation, dizziness, abdominal pain, skin rash, fevers, chills, night sweats, weight loss, swollen lymph nodes, body aches, joint swelling, chest pain, shortness of breath, mood changes. POSITIVE muscle aches  Objective  Blood pressure 110/72, pulse 67, height 5' 5.5" (1.664 m), weight 215 lb  (97.5 kg), last menstrual period 10/05/2017, SpO2 98 %.   General: No apparent distress alert and oriented x3 mood and affect normal, dressed appropriately.  HEENT: Pupils equal, extraocular movements intact  Respiratory: Patient's speak in full sentences and does not appear short of breath  Cardiovascular: No lower extremity edema, non tender, no erythema  Gait normal with good balance and coordination.  MSK: Mild arthritic changes of multiple joints Right thumb still has trigger nodule noted but no triggering.  Patient has full range of motion of the thumb noted at this time.  Neurovascular intact distally.  Right foot exam shows the patient does have some hallux limitus noted.  Patient does still have tenderness to palpation on the plantar aspect of the foot over the first MTP as well as somewhat over the first MTP itself.  No significant swelling noted.    Impression and Recommendations:     The above documentation  has been reviewed and is accurate and complete Lyndal Pulley, DO

## 2020-12-31 ENCOUNTER — Other Ambulatory Visit: Payer: Self-pay

## 2020-12-31 ENCOUNTER — Ambulatory Visit (INDEPENDENT_AMBULATORY_CARE_PROVIDER_SITE_OTHER): Payer: 59 | Admitting: Family Medicine

## 2020-12-31 ENCOUNTER — Encounter: Payer: Self-pay | Admitting: Family Medicine

## 2020-12-31 ENCOUNTER — Other Ambulatory Visit: Payer: Self-pay | Admitting: *Deleted

## 2020-12-31 VITALS — BP 110/72 | HR 67 | Ht 65.5 in | Wt 215.0 lb

## 2020-12-31 DIAGNOSIS — Z9189 Other specified personal risk factors, not elsewhere classified: Secondary | ICD-10-CM

## 2020-12-31 DIAGNOSIS — M65311 Trigger thumb, right thumb: Secondary | ICD-10-CM

## 2020-12-31 DIAGNOSIS — M19079 Primary osteoarthritis, unspecified ankle and foot: Secondary | ICD-10-CM | POA: Diagnosis not present

## 2020-12-31 DIAGNOSIS — M79671 Pain in right foot: Secondary | ICD-10-CM | POA: Diagnosis not present

## 2020-12-31 NOTE — Patient Instructions (Addendum)
Good to see you Glad the thumb is better Referral for othtotics See me again in 2-3 months

## 2020-12-31 NOTE — Assessment & Plan Note (Signed)
Patient is doing much better with the thumb at this moment.  Did respond well to the injection.  We will continue to follow-up in 2 to 77-month intervals.  Hopefully this will hold for some time but can repeat injection if necessary

## 2020-12-31 NOTE — Assessment & Plan Note (Signed)
Known arthritic changes.  Patient does have pes planus as well.  Will refer patient to physical therapy for aquatics which I think will be most beneficial.

## 2021-01-03 NOTE — Progress Notes (Signed)
[name]  [phone] [phone]) [phone]     ID: Alicia [name] DOB: 08/19/1966  MR#: [id number]  HYQ#:[id number]  Patient Care Team: Lucianne Lei, MD as PCP - General (Family Medicine) Chauncey Cruel, MD OTHER MD:  CHIEF COMPLAINT: Breast cancer high risk  CURRENT TREATMENT: Intensified screening   HISTORY OF CURRENT ILLNESS: Alicia [name] has a family history of breast cancer-- her sister was diagnosed at age 53, and a maternal aunt was diagnosed at age 53.   Alicia [name] has been under surveillance for a changing cyst pattern in her right breast. She underwent 12 month follow up bilateral diagnostic mammography on 11/30/2020 showing: breast composition B; no evidence of malignancy in either breast. At that time, her lifetime risk for developing breast cancer was calculated at 28.8%.  The patient's subsequent history is as detailed below.   INTERVAL HISTORY: Alicia [name] was evaluated in the high risk breast cancer clinic on 01/04/2021.  REVIEW OF SYSTEMS: The patient denies unusual headaches, visual changes, nausea, vomiting, stiff neck, dizziness, or gait imbalance. There has been no cough, phlegm production, or pleurisy, no chest pain or pressure, and no change in bowel or bladder habits. The patient denies fever, rash, bleeding, unexplained fatigue or unexplained weight loss.  For exercise she walks her dogs and swims.  A detailed review of systems was otherwise entirely negative.   COVID 19 VACCINATION STATUS: fully vaccinated [employer]), with booster 08/2020   PAST MEDICAL HISTORY: Past Medical History:  Diagnosis Date  . Allergy   . Hypersomnia with sleep apnea, unspecified 05/23/2013  . OSA on CPAP 08/15/2013  . Wears glasses     PAST SURGICAL HISTORY: Past Surgical History:  Procedure Laterality Date  . COLONOSCOPY WITH PROPOFOL N/A 12/04/2020   Procedure: COLONOSCOPY WITH PROPOFOL;  Surgeon: Carol Ada, MD;  Location: [facility]  ENDOSCOPY;  Service: Endoscopy;  Laterality: N/A;  . HYSTEROSCOPY WITH D & C    . WISDOM TOOTH EXTRACTION      FAMILY HISTORY: Family History  Problem Relation Age of Onset  . Diabetes Father        type 2  . Depression Father   . Dementia Father   . Narcolepsy Father        cataplexy  . High Cholesterol Father   . Stroke Maternal Grandmother   . Heart disease Maternal Grandfather   The patient's father died at age 67 with complications of Alzheimer's disease and diabetes.  His parents, brother and sister did not have any cancer to the patient's knowledge.  There is a second cousin on the paternal side with a history of lymphoma.  The patient's mother is 53 years old as of March 2022.  Her parents have no history of cancer.  As she had 9 siblings 1 of whom had gallbladder cancer and the other one had breast cancer in her early 53s.  The patient herself has 1 brother and 2 sisters.  One of her sisters has been diagnosed with breast cancer, and has been genetically tested.  Genetics showed what sounds like a recessive heterozygous polyposis gene.  I do not have a copy of those records.   GYNECOLOGIC HISTORY:  Patient's last menstrual period was 10/05/2017. Menarche: 55 years old [place] P 0 LMP February 2022 (menstruation prior to that was 6 months earlier) Contraceptive remote oral contraceptives without complications HRT no  Hysterectomy? no BSO? 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  She lives alone with Barth Kirks  and Underwood, who are shi Tzus.*.    ADVANCED DIRECTIVES: The patient sister [name] is her healthcare power of attorney.  Heidi can be reached at [place]: Social History   Tobacco Use  . Smoking status: Never Smoker  . Smokeless tobacco: Never Used  Substance Use Topics  . Alcohol use: No  . Drug use: No     Colonoscopy: February 2022/[facility]  PAP: Up-to-date  Bone density: n/a   Allergies  Allergen  Reactions  . Latex Itching and Rash  . Other     Seasonal   . Lubricants Itching and Rash    Current Outpatient Medications  Medication Sig Dispense Refill  . amphetamine-dextroamphetamine (ADDERALL) 10 MG tablet Take 1 tablet (10 mg total) by mouth 2 (two) times daily with a meal. 60 tablet 0  . b complex vitamins capsule Take 1 capsule by mouth daily.    . Bepotastine Besilate 1.5 % SOLN Place 1 drop into both eyes daily as needed (allergies).    . betamethasone valerate lotion (VALISONE) 0.1 % Apply 1 application topically daily as needed for irritation (scalp).    Marland Kitchen desonide (DESOWEN) 0.05 % lotion Apply 1 application topically 2 (two) times daily as needed (irritation).    . fexofenadine (ALLEGRA) 180 MG tablet Take 180 mg by mouth daily.    Marland Kitchen gabapentin (NEURONTIN) 100 MG capsule Take 2 capsules (200 mg total) by mouth at bedtime. (Patient taking differently: Take 200 mg by mouth at bedtime as needed (pain).) 180 capsule 3  . Multiple Vitamin (MULTIVITAMIN) tablet Take 1 tablet by mouth daily.    . pimecrolimus (ELIDEL) 1 % cream Apply 1 application topically 2 (two) times daily as needed (irritation).    . rosuvastatin (CRESTOR) 5 MG tablet Take 5 mg by mouth daily.    Marland Kitchen triamcinolone (NASACORT) 55 MCG/ACT AERO nasal inhaler Place 2 sprays into the nose 2 (two) times daily as needed (allergies).    . venlafaxine XR (EFFEXOR-XR) 37.5 MG 24 hr capsule TAKE 1 CAPSULE BY MOUTH DAILY WITH BREAKFAST. (Patient taking differently: Take 37.5 mg by mouth daily with breakfast.) 90 capsule 1  . vitamin E 180 MG (400 UNITS) capsule Take 400 Units by mouth daily.    . Rhubarb (ESTROVEN MENOPAUSE RELIEF PO) Take 1 capsule by mouth daily. (Patient not taking: Reported on 01/04/2021)     No current facility-administered medications for this visit.    OBJECTIVE:   Vitals:   01/04/21 1607  BP: 120/74  Pulse: 73  Resp: 17  Temp: 97.7 F (36.5 C)  SpO2: 99%     Body mass index is 34.91 kg/m.    Wt Readings from Last 3 Encounters:  01/04/21 213 lb (96.6 kg)  12/31/20 215 lb (97.5 kg)  11/27/20 213 lb (96.6 kg)      ECOG FS:1 - Symptomatic but completely ambulatory  Ocular: Sclerae unicteric, pupils round and equal Ear-nose-throat: Wearing a mask Lymphatic: No cervical or supraclavicular adenopathy Lungs no rales or rhonchi Heart regular rate and rhythm Abd soft, nontender, positive bowel sounds MSK no focal spinal tenderness, no joint edema Neuro: non-focal, well-oriented, appropriate affect Breasts: I do not palpate a mass in either breast.  There are no skin or nipple changes of concern.  Both axillae are benign   LAB RESULTS:  CMP     Component Value Date/Time   NA 139 01/04/2021 1550   K 4.1 01/04/2021 1550   CL 106 01/04/2021 1550  CO2 26 01/04/2021 1550   GLUCOSE 92 01/04/2021 1550   BUN 14 01/04/2021 1550   CREATININE 1.00 01/04/2021 1550   CALCIUM 8.9 01/04/2021 1550   PROT 7.5 01/04/2021 1550   ALBUMIN 3.9 01/04/2021 1550   AST 21 01/04/2021 1550   ALT 18 01/04/2021 1550   ALKPHOS 65 01/04/2021 1550   BILITOT 0.9 01/04/2021 1550   GFRNONAA >60 01/04/2021 1550    No results found for: TOTALPROTELP, ALBUMINELP, A1GS, A2GS, BETS, BETA2SER, GAMS, MSPIKE, SPEI  Lab Results  Component Value Date   WBC 5.7 01/04/2021   NEUTROABS 3.3 01/04/2021   HGB 13.2 01/04/2021   HCT 40.3 01/04/2021   MCV 98.3 01/04/2021   PLT 341 01/04/2021    No results found for: LABCA2  No components found for: [id number]  No results for input(s): INR in the last 168 hours.  No results found for: LABCA2  No results found for: MGN003  No results found for: BCW888  No results found for: BVQ945  No results found for: CA2729  No components found for: HGQUANT  No results found for: CEA1 / No results found for: CEA1   No results found for: AFPTUMOR  No results found for: CHROMOGRNA  No results found for: KPAFRELGTCHN, LAMBDASER, KAPLAMBRATIO (kappa/lambda  light chains)  No results found for: HGBA, HGBA2QUANT, HGBFQUANT, HGBSQUAN (Hemoglobinopathy evaluation)   No results found for: LDH  No results found for: IRON, TIBC, IRONPCTSAT (Iron and TIBC)  No results found for: FERRITIN  Urinalysis    Component Value Date/Time   COLORURINE YELLOW 08/30/2007 0915   APPEARANCEUR CLEAR 08/30/2007 0915   LABSPEC 1.015 08/30/2007 0915   PHURINE 7.0 08/30/2007 0915   GLUCOSEU NEGATIVE 08/30/2007 0915   HGBUR NEGATIVE 08/30/2007 0915   BILIRUBINUR NEGATIVE 08/30/2007 0915   KETONESUR NEGATIVE 08/30/2007 0915   PROTEINUR NEGATIVE 08/30/2007 0915   UROBILINOGEN 0.2 08/30/2007 0915   NITRITE NEGATIVE 08/30/2007 0915   LEUKOCYTESUR  08/30/2007 0915    NEGATIVE MICROSCOPIC NOT DONE ON URINES WITH NEGATIVE PROTEIN, BLOOD, LEUKOCYTES, NITRITE, OR GLUCOSE <1000 mg/dL.     STUDIES: No results found.   ELIGIBLE FOR AVAILABLE RESEARCH PROTOCOL: no  ASSESSMENT: 55 y.o. [place] woman at high risk for breast cancer (29% lifetime).  PLAN: I met today with Olisa to review her situation.  I think she may qualify for genetics testing based on the fact that her sister did have 1 deleterious gene, even though not homozygous and not related to breast cancer.  I will check with our genetics counselors.  Afique does have about 2-1/2 times the normal lifetime breast cancer risk.  We discussed ways of reducing this risk chiefly tamoxifen and she has a good understanding of the possible toxicities side effects and complications of that agent.  After considering she decided against it.  We also considered intensified screening.  She just had her mammogram in February.  She could have a breast MRI in August then every subsequent year mammography in February and breast MRI in August.  This is attractive to her even though she understands issues relating to cost and false positives.  Accordingly I will set her up for a breast MRI in August of this year and we  will have a virtual visit shortly after that.  If she continues to be in favor of this approach then she will be released to her primary care physician or gynecologist to continue at their discretion  She knows to call for any other issue that may  develop before the next visit  Total encounter time 50 minutes.Sarajane Jews C. Tenlee Wollin, MD 01/04/2021 6:00 PM Medical Oncology and Hematology [facility] [place], [place] [address] Tel. [phone]    Fax. [phone]   This document serves as a record of services personally performed by Lurline Del, MD. It was created on his behalf by Wilburn Mylar, a trained medical scribe. The creation of this record is based on the scribe's personal observations and the provider's statements to them.   I, Lurline Del MD, have reviewed the above documentation for accuracy and completeness, and I agree with the above.    *Total Encounter Time as defined by the Centers for Medicare and Medicaid Services includes, in addition to the face-to-face time of a patient visit (documented in the note above) non-face-to-face time: obtaining and reviewing outside history, ordering and reviewing medications, tests or procedures, care coordination (communications with other health care professionals or caregivers) and documentation in the medical record.

## 2021-01-04 ENCOUNTER — Other Ambulatory Visit: Payer: Self-pay

## 2021-01-04 ENCOUNTER — Inpatient Hospital Stay: Payer: 59

## 2021-01-04 ENCOUNTER — Inpatient Hospital Stay: Payer: 59 | Attending: Oncology | Admitting: Oncology

## 2021-01-04 DIAGNOSIS — Z803 Family history of malignant neoplasm of breast: Secondary | ICD-10-CM | POA: Insufficient documentation

## 2021-01-04 DIAGNOSIS — Z9189 Other specified personal risk factors, not elsewhere classified: Secondary | ICD-10-CM | POA: Insufficient documentation

## 2021-01-04 DIAGNOSIS — Z1239 Encounter for other screening for malignant neoplasm of breast: Secondary | ICD-10-CM | POA: Insufficient documentation

## 2021-01-04 LAB — CMP (CANCER CENTER ONLY)
ALT: 18 U/L (ref 0–44)
AST: 21 U/L (ref 15–41)
Albumin: 3.9 g/dL (ref 3.5–5.0)
Alkaline Phosphatase: 65 U/L (ref 38–126)
Anion gap: 7 (ref 5–15)
BUN: 14 mg/dL (ref 6–20)
CO2: 26 mmol/L (ref 22–32)
Calcium: 8.9 mg/dL (ref 8.9–10.3)
Chloride: 106 mmol/L (ref 98–111)
Creatinine: 1 mg/dL (ref 0.44–1.00)
GFR, Estimated: >60 mL/min (ref >60)
Glucose, Bld: 92 mg/dL (ref 70–99)
Potassium: 4.1 mmol/L (ref 3.5–5.1)
Sodium: 139 mmol/L (ref 135–145)
Total Bilirubin: 0.9 mg/dL (ref 0.3–1.2)
Total Protein: 7.5 g/dL (ref 6.5–8.1)

## 2021-01-04 LAB — CBC WITH DIFFERENTIAL (CANCER CENTER ONLY)
Abs Immature Granulocytes: 0.01 10*3/uL (ref 0.00–0.07)
Basophils Absolute: 0.1 10*3/uL (ref 0.0–0.1)
Basophils Relative: 1 %
Eosinophils Absolute: 0.1 10*3/uL (ref 0.0–0.5)
Eosinophils Relative: 2 %
HCT: 40.3 % (ref 36.0–46.0)
Hemoglobin: 13.2 g/dL (ref 12.0–15.0)
Immature Granulocytes: 0 %
Lymphocytes Relative: 30 %
Lymphs Abs: 1.7 10*3/uL (ref 0.7–4.0)
MCH: 32.2 pg (ref 26.0–34.0)
MCHC: 32.8 g/dL (ref 30.0–36.0)
MCV: 98.3 fL (ref 80.0–100.0)
Monocytes Absolute: 0.5 10*3/uL (ref 0.1–1.0)
Monocytes Relative: 9 %
Neutro Abs: 3.3 10*3/uL (ref 1.7–7.7)
Neutrophils Relative %: 58 %
Platelet Count: 341 10*3/uL (ref 150–400)
RBC: 4.1 MIL/uL (ref 3.87–5.11)
RDW: 11.8 % (ref 11.5–15.5)
WBC Count: 5.7 10*3/uL (ref 4.0–10.5)
nRBC: 0.4 % — ABNORMAL HIGH (ref 0.0–0.2)

## 2021-01-04 NOTE — Addendum Note (Signed)
Addended by: Lowella Dell on: 01/04/2021 06:10 PM   Modules accepted: Orders

## 2021-01-05 ENCOUNTER — Telehealth: Payer: Self-pay | Admitting: Oncology

## 2021-01-05 NOTE — Telephone Encounter (Signed)
Scheduled appointment per 3/14 los. Spoke to patient who is aware of appointment date and time.  

## 2021-01-11 ENCOUNTER — Other Ambulatory Visit: Payer: Self-pay | Admitting: Family Medicine

## 2021-01-14 ENCOUNTER — Ambulatory Visit: Payer: Self-pay | Admitting: Neurology

## 2021-01-19 ENCOUNTER — Encounter: Payer: Self-pay | Admitting: Neurology

## 2021-01-20 ENCOUNTER — Other Ambulatory Visit: Payer: Self-pay

## 2021-01-20 ENCOUNTER — Encounter: Payer: Self-pay | Admitting: Family Medicine

## 2021-01-20 MED ORDER — VENLAFAXINE HCL ER 37.5 MG PO CP24: 37.5000 mg | ORAL_CAPSULE | Freq: Every day | ORAL | 1 refills | Status: DC

## 2021-01-21 ENCOUNTER — Encounter: Payer: Self-pay | Admitting: Neurology

## 2021-01-21 ENCOUNTER — Telehealth: Payer: Self-pay

## 2021-01-21 ENCOUNTER — Ambulatory Visit: Payer: 59 | Admitting: Neurology

## 2021-01-21 VITALS — BP 112/64 | HR 64 | Ht 65.5 in | Wt 215.0 lb

## 2021-01-21 DIAGNOSIS — Z9989 Dependence on other enabling machines and devices: Secondary | ICD-10-CM | POA: Diagnosis not present

## 2021-01-21 DIAGNOSIS — G4733 Obstructive sleep apnea (adult) (pediatric): Secondary | ICD-10-CM

## 2021-01-21 DIAGNOSIS — G4710 Hypersomnia, unspecified: Secondary | ICD-10-CM | POA: Diagnosis not present

## 2021-01-21 DIAGNOSIS — G4730 Sleep apnea, unspecified: Secondary | ICD-10-CM

## 2021-01-21 NOTE — Progress Notes (Signed)
Subjective:    Patient ID: Alicia [name] is a 55 y.o. female.  HPI     Interim history:   Alicia [name] is a very pleasant 55 year old right-handed woman with an underlying medical history of allergies, hyperlipidemia, and obstructive sleep apnea on CPAP with residual daytime somnolence, who presents for follow-up consultation of her sleep apnea, after interim home sleep testing and starting autoPAP. The patient is unaccompanied today. I last saw her on 03/26/20, at which time she reported doing well.  She was on Adderall 10 mg once daily, had some issues with insomnia at times.  She was followed by sports medicine for her neck pain.  She called in the interim in October 2021 reporting that she had residual issues with her CPAP and that she felt it was not effectively treating her sleep apnea.  Compliance download did show elevated AHI with mainly obstructive events.  She was advised to proceed with a home sleep test.  Home sleep test for reevaluation of her sleep apnea on 10/06/2020 which indicated severe obstructive sleep apnea with an AHI of 64.4/h, O2 nadir 79%.  She was advised to start AutoPap machine.  Today, 01/21/2021: I reviewed her AutoPap compliance data from the past 30 days from 12/21/2020 through 01/19/2021, which is a total of 30 days, during which time she used her machine every night with percent use days greater than 4 hours at 100%, indicating superb compliance with an average usage of 7 hours and 36 minutes, residual AHI mildly elevated at 8.4/h, 95th percentile of pressure at 12.7 cm with a range of 7 to 14 cm, leak on the low side with a 95th percentile at 4.6 L/min.  She reports that she is feeling better.  She is working on weight loss.  She just got a new exercise machine from [employer].  She is motivated to lose about 20 pounds.  She is compliant with her AutoPap but would like to switch from nasal pillows to a nasal mask.  We were able to do a mask refit in the office after  our appointment and provide her with a sample of her choice.  Otherwise, she is doing well.  She does have some stressors, primarily pertaining to her sister's health.  The patient's allergies, current medications, family history, past medical history, past social history, past surgical history and problem list were reviewed and updated as appropriate.    Previously (copied from previous notes for reference):     I saw her on 03/25/2019 in a virtual visit, at which time she was compliant with her CPAP.  She had ongoing issues with neck pain and was supposed to get a epidural steroid injection in her neck soon.  She was advised to follow-up routinely in 1 year.   I reviewed her CPAP compliance data from 02/24/2020 through 03/24/2020, which is a total of 30 days, during which time she used her machine every night with percent use days greater than 4 hours at 97%, indicating excellent compliance with an average usage of 7 hours and 16 minutes, residual AHI mildly elevated at 9.3 with primarily central apneas, leak acceptable, 95th percentile at 8.4 L/min, pressure of 11 cm with EPR of 2.    I saw her on 07/17/2018, at which time she was compliant with her CPAP.  She was taking Adderall 20 mg strength generic half a pill to 1 pill daily on average.  We mutually agreed to change her prescription to 10 mg strength so she would not  have to break the 20 mg in half.   I reviewed her CPAP compliance data from 02/19/2019 through 03/20/2019 which is a total of 30 days, during which time she used her machine every night with percent used days greater than 4 hours at 100%, indicating superb compliance with an average usage of 8 hours and 3 minutes which is very good, residual AHI slightly elevated at 7.8/h, leak on the low end, with a 95th percentile at 6.6 L/min on a pressure of 11 cm with EPR of 2   I saw her on 01/11/2018, at which time she was fully compliant with her CPAP. She was doing quite well on her stimulant but  was not always taking her second dose. She had significant neck pain and had been to physical therapy, had injection under neurosurgery as well. I suggested we maintain her low dose Adderall generic 10 mg twice a day.   I reviewed her CPAP compliance data from 06/12/2018 through 07/11/2018 which is a total of 30 days, during which time she used her CPAP 29 days with percent used days greater than 4 hours at 90%, indicating excellent compliance with an average usage of 6 hours and 57 minutes, residual AHI borderline at 4.9 per hour, leak acceptable with the 95th percentile at 8.6 L/m on a pressure of 11 cm with EPR of 2.    I reviewed her CPAP compliance data from 12/09/2017 through 01/07/2018 which is a total of 30 days, during which time she used her CPAP every night with percent used days greater than 4 hours at 100%, indicating superb compliance with an average usage of 7 hours and 14 minutes, residual AHI borderline at 5.9 per hour, leaked low with the 95th percentile at 4.4 L/m on a pressure of 10 cm with EPR of 2.   I saw her on 07/13/2017, at which time she was compliant with CPAP. She had issues with neck pain and went through physical therapy with dry needling. She was supposed to see orthopedics She was doing reasonably well on her Adderall and was compliant with CPAP therapy. I suggested a six-month follow-up.    I saw her on 01/05/2017, at which time she was compliant with her CPAP. She had occasional difficulty maintaining sleep at night, was attributing this to taking the Adderall. She was trying to be careful not to take it too late after lunch. I suggested we continue Adderall immediate release 10 mg twice a day.    I reviewed her CPAP compliance data from 06/11/2017 through 07/10/2017 which is a total of 30 days, during which time she used her CPAP every night with percent used days greater than 4 hours at 100%, indicating superb compliance with an average usage of 7 hours and 11 minutes,  residual AHI borderline at 6.4 per hour, leak acceptable with the 95th percentile at 12.6 L/m on a pressure of 10 cm with EPR of 2.    I reviewed her CPAP compliance data from 12/06/2016 through 01/04/2017 which is a total of 30 days, during which time she used her CPAP every night with percent used days greater than 4 hours at 100%, indicating superb compliance with an average usage of 6 hours and 29 minutes, residual AHI at goal at 3.2 per hour, leak low with the 95th percentile at 3.1 L/m on a pressure of 10 cm with EPR of 2.     I saw her on 07/07/2016 after a gap of nearly 2 years, at which time  she reported doing okay, weight had been fluctuating, but was going to the gym on a fairly regular basis. She was fully compliant with CPAP, still had sleepiness during the day especially in the afternoons, particularly after eating a carb-laden meal. Other than that, she was stable, taking a vitamin D supplement and other supplements, vitamin D was low per her report and she was told to start taking over-the-counter vit D per PCP. She had not tried the Adderall that I suggested at the last visit and had stopped taking the Nuvigil even though it worked okay because of was too expensive. I suggested starting her on Adderall immediate release 10 mg once daily in the early afternoon. She emailed in the interim in November 2017, reporting being a little bit more focused during the day and not overeating as much but as far as daytime somnolence she did not notice any telltale was palms. I suggested we increase the Adderall to 10 mg twice daily, second dose no later than 3 PM to avoid nighttime insomnia.   I saw her on 08/21/2014, at which time she reported doing well on Nuvigil 150 mg once daily and she felt that it was better than the Provigil generic. She had some residual sleepiness but felt improved overall. She was compliant with CPAP therapy. I suggested we try low-dose Adderall as needed for residual  sleepiness.   I reviewed her CPAP compliance data from 06/07/2016 through 07/06/2016 which is a total of 30 days, during which time she used her machine every night with percent used days greater than 4 hours at 100%, indicating superb compliance with an average usage of 7 hours and 4 minutes, residual AHI 4.1 per hour, leak low for the 95th percentile at 2.6 L/m on a pressure of 10 cm with EPR of 2.   I saw her on 02/13/2014, at which time she continued to endorse daytime somnolence. She had just been approved for Nuvigil but had not started it yet. She was fully compliant with CPAP therapy.   I reviewed her compliance data from the compliance card on her machine: 07/22/2014 through 08/20/2014 which is a total of 30 days during which time she used her machine every night with percent used days greater than 4 hours of 97%, indicating excellent compliance, pressure still at 10 cm with EPR of 2. Residual AHI acceptable at 4.9 per hour and leak generally low with the 95th percentile of leak at 6.2 L/m, average usage of 7 hours and 19 minutes. She is compliant with her CPAP machine, she has a F&P Pillaro nasal pillows. She has exacerbation of her allergies and also facial eczema. She has recently seen a dermatologist and was given a new ointment.   I saw her on 08/15/2013, at which time we talked about her sleep test results from 2013 and her CPAP compliance which was excellent. She has a family history of narcolepsy with cataplexy in her father. She felt Provigil as needed was helpful. She had trouble remembering the second dose of Provigil and therefore I suggested a trial of Nuvigil once daily. She was approved for this.    I first met her on 05/23/2013, at which time she reported being veyr sleepy despite being compliant with CPAP therapy. I suggested that she try Provigil to help stay awake during the day a little better, starting with 200 mg strength half a pill up to twice daily as needed. I advised her  not to take it after 3 PM to  avoid insomnia at night.  She has a FHx of narcolepsy with cataplexy in her father and was particularly concerned that she may have narcolepsy.    I had reviewed records including a recent CPAP compliance download from 04/02/2013 through 05/22/2013, total of 51 days, during which time she used CPAP every day. Percent used days greater than 4 hours was 96% indicating excellent compliance. Her CPAP pressure is 10 cm water pressure with an EPR of 2. Residual AHI is 4.7 indicating an appropriate pressure level. Average usage was 6:41 hours.    I reviewed a sleep study report from 11/10/2011. This was a split-night study. REM percentage was 17% and the baseline portion of the study. Her AHI was 61.9 per hour with an oxyhemoglobin desaturation nadir of 75%. She was titrated on CPAP from 4-10 cm of water pressure with elimination of her sleep disordered breathing reported.    She uses a nasal pillows mask. I also reviewed older compliance data from August to Nov. 2013, during which time she was very compliant but had a high leak. At the time, she was using a FFM and leak was much better with the new mask.   Her Past Medical History Is Significant For: Past Medical History:  Diagnosis Date  . Allergy   . Hypersomnia with sleep apnea, unspecified 05/23/2013  . OSA on CPAP 08/15/2013  . Wears glasses     Her Past Surgical History Is Significant For: Past Surgical History:  Procedure Laterality Date  . COLONOSCOPY WITH PROPOFOL N/A 12/04/2020   Procedure: COLONOSCOPY WITH PROPOFOL;  Surgeon: Carol Ada, MD;  Location: [facility];  Service: Endoscopy;  Laterality: N/A;  . HYSTEROSCOPY WITH D & C    . WISDOM TOOTH EXTRACTION      Her Family History Is Significant For: Family History  Problem Relation Age of Onset  . Diabetes Father        type 2  . Depression Father   . Dementia Father   . Narcolepsy Father        cataplexy  . High Cholesterol Father   . Stroke  Maternal Grandmother   . Heart disease Maternal Grandfather     Her Social History Is Significant For: Social History   Socioeconomic History  . Marital status: Single    Spouse name: Not on file  . Number of children: 0  . Years of education: Post Grad  . Highest education level: Not on file  Occupational History    Comment: [employer]  Tobacco Use  . Smoking status: Never Smoker  . Smokeless tobacco: Never Used  Substance and Sexual Activity  . Alcohol use: No  . Drug use: No  . Sexual activity: Never  Other Topics Concern  . Not on file  Social History Narrative   Patient lives at home alone.   Caffeine Use: occasionally soda   Social Determinants of Health   Financial Resource Strain: Not on file  Food Insecurity: Not on file  Transportation Needs: Not on file  Physical Activity: Not on file  Stress: Not on file  Social Connections: Not on file    Her Allergies Are:  Allergies  Allergen Reactions  . Latex Itching and Rash  . Other     Seasonal   . Lubricants Itching and Rash  :   Her Current Medications Are:  Outpatient Encounter Medications as of 01/21/2021  Medication Sig  . amphetamine-dextroamphetamine (ADDERALL) 10 MG tablet Take 1 tablet (10 mg total) by mouth 2 (  two) times daily with a meal.  . b complex vitamins capsule Take 1 capsule by mouth daily.  . Bepotastine Besilate 1.5 % SOLN Place 1 drop into both eyes daily as needed (allergies).  . betamethasone valerate lotion (VALISONE) 0.1 % Apply 1 application topically daily as needed for irritation (scalp).  Marland Kitchen desonide (DESOWEN) 0.05 % lotion Apply 1 application topically 2 (two) times daily as needed (irritation).  . fexofenadine (ALLEGRA) 180 MG tablet Take 180 mg by mouth daily.  Marland Kitchen gabapentin (NEURONTIN) 100 MG capsule Take 2 capsules (200 mg total) by mouth at bedtime. (Patient taking differently: Take 200 mg by mouth at bedtime as needed (pain).)  . Multiple Vitamin (MULTIVITAMIN)  tablet Take 1 tablet by mouth daily.  . pimecrolimus (ELIDEL) 1 % cream Apply 1 application topically 2 (two) times daily as needed (irritation).  . Rhubarb (ESTROVEN MENOPAUSE RELIEF PO) Take 1 capsule by mouth daily.  . rosuvastatin (CRESTOR) 5 MG tablet Take 5 mg by mouth daily.  Marland Kitchen triamcinolone (NASACORT) 55 MCG/ACT AERO nasal inhaler Place 2 sprays into the nose 2 (two) times daily as needed (allergies).  . venlafaxine XR (EFFEXOR-XR) 37.5 MG 24 hr capsule Take 1 capsule (37.5 mg total) by mouth daily with breakfast.  . vitamin E 180 MG (400 UNITS) capsule Take 400 Units by mouth daily.   No facility-administered encounter medications on file as of 01/21/2021.  :  Review of Systems:  Out of a complete 14 point review of systems, all are reviewed and negative with the exception of these symptoms as listed below: Review of Systems  Neurological:       Here for f/u on autopap setting. Pt reports settings are working well for her, would like to discuss getting a new mask.     Objective:  Neurological Exam  Physical Exam Physical Examination:   Vitals:   01/21/21 0944  BP: 112/64  Pulse: 64  SpO2: 94%    General Examination: The patient is a very pleasant 55 y.o. female in no acute distress. She appears well-developed and well-nourished and well groomed.   HEENT:Normocephalic, atraumatic, pupils are equal, round and reactive to light and accommodation. Extraocular tracking is good without limitation to gaze excursion or nystagmus noted. Normal smooth pursuit is noted. Hearing is grossly intact. Face is symmetric with normal facial animation and normal facial sensation. Speech is clear with no dysarthria noted. There is no hypophonia. There is no lip, neck/head, jaw or voice tremor. Oropharynx exam reveals:no obvious change, mild mouth dryness noted.  Chest:Clear to auscultation without wheezing, rhonchi or crackles noted.  Heart:S1+S2+0, regular and normal without murmurs,  rubs or gallops noted.   Abdomen:Soft, non-tender and non-distended.  Extremities:There is noobviousedema in the distal lower extremities bilaterally.   Skin: Warm and dry without trophic changes noted.  Musculoskeletal: exam reveals no obvious joint deformities, tenderness or joint swelling.   Neurologically:  Mental status: The patient is awake, alert and oriented in all 4 spheres. Herimmediate and remote memory, attention, language skills and fund of knowledge are appropriate. There is no evidence of aphasia, agnosia, apraxia or anomia. Speech is clear with normal prosody and enunciation. Thought process is linear. Mood is normaland affect is normal.  Cranial nerves II - XII are as described above under HEENT exam.  Motor exam: Normal bulk, strength and tone is noted. There is no drift, tremor or rebound. Fine motor skills and coordination: intact.  Cerebellar testing: No dysmetria or intention tremor. There is no truncal or  gait ataxia.  Sensory exam: intact to light touch in the upper and lower extremities.  Gait, station and balance: Shestands easily. No veering to one side is noted. No leaning to one side is noted. Posture is age-appropriate and stance is narrow based. Gait shows normalstride length and normalpace. No problems turning are noted.   Assessment and Plan:  In summary, Atiyah Kilimanjarois a very pleasant 55 year old femalewith an underlying medical history of allergies and obesity, who returns for follow-up consultation of her OSAand hypersomnolence disorder.  She had a recent home sleep test which indicated severe obstructive sleep apnea and she was advised to start AutoPap therapy.  She did not get a new machine, she is not eligible until approximately April of next year for new equipment.  She is compliant with her AutoPap.  There is just very mild residual sleep apnea, we mutually agreed to continue with the settings for now.  She is going to try a nasal  mask and we were able to fit her with a new mask and provide her with a sample today.  If she likes it, I would be happy to prescribe it through her DME company, [employer].  She is doing well with Adderall low dose and typically ends up taking only 10 mg daily.  She has an updated prescription for this.  She is advised to follow-up routinely in this clinic in 1 year, sooner if needed.  I answered all her questions today and she was in agreement with the plan.   I spent 30 minutes in total face-to-face time and in reviewing records during pre-charting, more than 50% of which was spent in counseling and coordination of care, reviewing test results, reviewing medications and treatment regimen and/or in discussing or reviewing the diagnosis of OSA, the prognosis and treatment options. Pertinent laboratory and imaging test results that were available during this visit with the patient were reviewed by me and considered in my medical decision making (see chart for details).

## 2021-01-21 NOTE — Patient Instructions (Addendum)
It was good to see you again today.   You are fully compliant with your AutoPap.  We will continue with your current settings.  We will see you in follow-up routinely in 1 year, your prescription should be up-to-date for your Adderall.  We will try to get you fitted with a new nasal mask and if you like it, you can let us know and I will prescribe it through your DME company, [facility].  You should be eligible for a new machine in April of 2023.

## 2021-01-21 NOTE — Telephone Encounter (Signed)
Dr. Frances Furbish has asked for a mask re-fit with patient today during her follow up visit. Pt is having issues with the nasal pillow style mask which is causing some skin irratation. Pt was tried on several different styles of nasal masks. The mask she liked the best was a F&P Eson 2 with large nasal cushion. Pt stated it felt the best on and more comfortable on her face and around her nose. Pt was educated and shown how to put on mask, and how to adjust the headgear to eliminate any air leaks that may occur.

## 2021-03-11 ENCOUNTER — Ambulatory Visit: Payer: 59 | Admitting: Family Medicine

## 2021-04-01 ENCOUNTER — Ambulatory Visit: Payer: Self-pay | Admitting: Neurology

## 2021-04-16 ENCOUNTER — Other Ambulatory Visit: Payer: Self-pay | Admitting: Neurology

## 2021-04-19 MED ORDER — AMPHETAMINE-DEXTROAMPHETAMINE 10 MG PO TABS: 10.0000 mg | ORAL_TABLET | Freq: Two times a day (BID) | ORAL | 0 refills | Status: DC

## 2021-04-29 ENCOUNTER — Telehealth: Payer: Self-pay | Admitting: Oncology

## 2021-04-29 NOTE — Telephone Encounter (Signed)
Rescheduled appointment per provider. Patient is aware. 

## 2021-05-06 ENCOUNTER — Inpatient Hospital Stay: Admission: RE | Admit: 2021-05-06 | Payer: 59 | Source: Ambulatory Visit

## 2021-05-14 ENCOUNTER — Ambulatory Visit
Admission: RE | Admit: 2021-05-14 | Discharge: 2021-05-14 | Disposition: A | Discharge status: Home / Self Care | Payer: 59 | Source: Ambulatory Visit | Attending: Oncology | Admitting: Oncology

## 2021-05-14 DIAGNOSIS — Z9189 Other specified personal risk factors, not elsewhere classified: Secondary | ICD-10-CM

## 2021-05-14 DIAGNOSIS — Z1239 Encounter for other screening for malignant neoplasm of breast: Secondary | ICD-10-CM

## 2021-05-14 MED ORDER — GADOBUTROL 1 MMOL/ML IV SOLN
10.0000 mL | Freq: Once | INTRAVENOUS | Status: AC | PRN
  Administered 2021-05-14: 10 mL via INTRAVENOUS

## 2021-05-19 NOTE — Progress Notes (Signed)
[name] [address] [place] [address] Phone: [phone] Subjective:    I'm seeing this patient by the request  of:  Renaye Rakers, MD  I, Philbert Riser, PhD, LAT, ATC acting as a scribe for Terrilee Files, DO.  CC: right foot and thumb pain   BJY:[id number]  12/31/2020 Known arthritic changes.  Patient does have pes planus as well.  Will refer patient to physical therapy for aquatics which I think will be most beneficial.  Patient is doing much better with the thumb at this moment.  Did respond well to the injection.  We will continue to follow-up in 2 to 45-month intervals.  Hopefully this will hold for some time but can repeat injection if necessary  Update 05/20/2021 Alicia [name] is a 55 y.o. female coming in with complaint of R foot and R thumb pain. Patient states foot is improving. Pt c/o R thumb "tightness" and has been getting "stuck." Pt locates pain to the palmar aspect of the MCP joint of the 1st digit.       Past Medical History:  Diagnosis Date   Allergy    Hypersomnia with sleep apnea, unspecified 05/23/2013   OSA on CPAP 08/15/2013   Wears glasses    Past Surgical History:  Procedure Laterality Date   COLONOSCOPY WITH PROPOFOL N/A 12/04/2020   Procedure: COLONOSCOPY WITH PROPOFOL;  Surgeon: Jeani Hawking, MD;  Location: [facility];  Service: Endoscopy;  Laterality: N/A;   HYSTEROSCOPY WITH D & C     WISDOM TOOTH EXTRACTION     Social History   Socioeconomic History   Marital status: Single    Spouse name: Not on file   Number of children: 0   Years of education: Post Grad   Highest education level: Not on file  Occupational History    Comment: Artist  Tobacco Use   Smoking status: Never   Smokeless tobacco: Never  Substance and Sexual Activity   Alcohol use: No   Drug use: No   Sexual activity: Never  Other Topics Concern   Not on file  Social History Narrative   Patient lives at home  alone.   Caffeine Use: occasionally soda   Social Determinants of Health   Financial Resource Strain: Not on file  Food Insecurity: Not on file  Transportation Needs: Not on file  Physical Activity: Not on file  Stress: Not on file  Social Connections: Not on file   Allergies  Allergen Reactions   Latex Itching and Rash   Other     Seasonal    Lubricants Itching and Rash   Family History  Problem Relation Age of Onset   Diabetes Father        type 2   Depression Father    Dementia Father    Narcolepsy Father        cataplexy   High Cholesterol Father    Stroke Maternal Grandmother    Heart disease Maternal Grandfather      Current Outpatient Medications (Cardiovascular):    rosuvastatin (CRESTOR) 5 MG tablet, Take 5 mg by mouth daily.  Current Outpatient Medications (Respiratory):    fexofenadine (ALLEGRA) 180 MG tablet, Take 180 mg by mouth daily.   triamcinolone (NASACORT) 55 MCG/ACT AERO nasal inhaler, Place 2 sprays into the nose 2 (two) times daily as needed (allergies).    Current Outpatient Medications (Other):    amphetamine-dextroamphetamine (ADDERALL) 10 MG tablet, Take 1 tablet (10 mg total) by mouth  2 (two) times daily with a meal.   b complex vitamins capsule, Take 1 capsule by mouth daily.   Bepotastine Besilate 1.5 % SOLN, Place 1 drop into both eyes daily as needed (allergies).   betamethasone valerate lotion (VALISONE) 0.1 %, Apply 1 application topically daily as needed for irritation (scalp).   desonide (DESOWEN) 0.05 % lotion, Apply 1 application topically 2 (two) times daily as needed (irritation).   gabapentin (NEURONTIN) 100 MG capsule, Take 2 capsules (200 mg total) by mouth at bedtime. (Patient taking differently: Take 200 mg by mouth at bedtime as needed (pain).)   Multiple Vitamin (MULTIVITAMIN) tablet, Take 1 tablet by mouth daily.   pimecrolimus (ELIDEL) 1 % cream, Apply 1 application topically 2 (two) times daily as needed  (irritation).   Rhubarb (ESTROVEN MENOPAUSE RELIEF PO), Take 1 capsule by mouth daily.   venlafaxine XR (EFFEXOR-XR) 37.5 MG 24 hr capsule, Take 1 capsule (37.5 mg total) by mouth daily with breakfast.   vitamin E 180 MG (400 UNITS) capsule, Take 400 Units by mouth daily.   Reviewed prior external information including notes and imaging from  primary care provider As well as notes that were available from care everywhere and other healthcare systems.  Past medical history, social, surgical and family history all reviewed in electronic medical record.  No pertanent information unless stated regarding to the chief complaint.   Review of Systems:  No headache, visual changes, nausea, vomiting, diarrhea, constipation, dizziness, abdominal pain, skin rash, fevers, chills, night sweats, weight loss, swollen lymph nodes, body aches, joint swelling, chest pain, shortness of breath, mood changes. POSITIVE muscle aches, body ches  Objective  Blood pressure 132/88, pulse 74, height 5' 5.5" (1.664 m), weight 210 lb (95.3 kg), last menstrual period 10/05/2017, SpO2 98 %.   General: No apparent distress alert and oriented x3 mood and affect normal, dressed appropriately.  HEENT: Pupils equal, extraocular movements intact  Respiratory: Patient's speak in full sentences and does not appear short of breath  Cardiovascular: No lower extremity edema, non tender, no erythema  Gait normal with good balance and coordination.  MSK: Right hand exam shows the patient does have a lipoma trigger nodule noted at the A1 pulley.  Patient is severely tender to palpation.  Good grip strength is noted at this time.  No thenar eminence wasting noted. Foot exam shows the patient does have pes planus.  Some mild overpronation of the hindfoot.  Patient does have some limited range of motion of the first MTP.  Limited musculoskeletal ultrasound was performed and interpreted by Judi Saa  Limited ultrasound of patient's  forearm on the flexor tendon sheath shows the patient does have a trigger nodule noted.  No tearing of the tendon noted.   Impression and Recommendations:     The above documentation has been reviewed and is accurate and complete Judi Saa, DO

## 2021-05-20 ENCOUNTER — Ambulatory Visit (INDEPENDENT_AMBULATORY_CARE_PROVIDER_SITE_OTHER): Payer: 59 | Admitting: Family Medicine

## 2021-05-20 ENCOUNTER — Ambulatory Visit: Payer: Self-pay

## 2021-05-20 ENCOUNTER — Other Ambulatory Visit: Payer: Self-pay

## 2021-05-20 ENCOUNTER — Encounter: Payer: Self-pay | Admitting: Family Medicine

## 2021-05-20 VITALS — BP 132/88 | HR 74 | Ht 65.5 in | Wt 210.0 lb

## 2021-05-20 DIAGNOSIS — M65311 Trigger thumb, right thumb: Secondary | ICD-10-CM

## 2021-05-20 DIAGNOSIS — M79671 Pain in right foot: Secondary | ICD-10-CM

## 2021-05-20 DIAGNOSIS — M5030 Other cervical disc degeneration, unspecified cervical region: Secondary | ICD-10-CM

## 2021-05-20 NOTE — Patient Instructions (Addendum)
Good to see you  Wear the frog splint at night  Keep up with the weight loss! Great   See me again in 3 months

## 2021-05-20 NOTE — Assessment & Plan Note (Signed)
Chronic.  No true radicular symptoms at this time we will continue to monitor.  Continue to stay active.  Swimming is good for her.  Follow-up again in 2 to 3 months

## 2021-05-20 NOTE — Assessment & Plan Note (Signed)
Patient does have a trigger thumb.  Discussed with his about possibility of injection.  Patient declined at the moment.  Patient will increase activity slowly.  Follow-up with me again in 2 to 3 months

## 2021-05-20 NOTE — Assessment & Plan Note (Signed)
Stable at the moment.  No changes.

## 2021-06-21 ENCOUNTER — Telehealth: Payer: 59 | Admitting: Oncology

## 2021-06-28 NOTE — Progress Notes (Signed)
[facility]  [phone] [phone]     ID: Hoover Browns DOB: May 02, 1966  MR#: [id number]  RKY#:[id number]  Patient Care Team: Renaye Rakers, MD as PCP - General (Family Medicine) Swetha Rayle, Valentino Hue, MD as Consulting Physician (Oncology) Jaymes Graff, MD as Consulting Physician (Obstetrics and Gynecology) Jeani Hawking, MD as Consulting Physician (Gastroenterology) Kari Baars OTHER MD:  I connected with Hoover Browns on 06/28/21 at  9:45 AM EDT by video enabled telemedicine visit and verified that I am speaking with the correct person using two identifiers.   I discussed the limitations, risks, security and privacy concerns of performing an evaluation and management service by telemedicine and the availability of in-person appointments. I also discussed with the patient that there may be a patient responsible charge related to this service. The patient expressed understanding and agreed to proceed.   Other persons participating in the visit and their role in the encounter: None  Patient's location: Home Provider's location: [facility]   I provided 20 minutes face-to-face counseling during this encounteras documented under my assessment & plan.   CHIEF COMPLAINT: Breast cancer high risk  CURRENT TREATMENT: Intensified screening   INTERVAL HISTORY: Lasharon was contacted today for follow up of her high risk for breast cancer. She was evaluated in the high risk breast cancer clinic on 01/04/2021.  Since consultation, she underwent breast MRI on 05/14/21 showing: breast composition B; no evidence of malignancy.  She has not yet received a bill for the MRI so she cannot tell me cost or if that would be an issue   REVIEW OF SYSTEMS: Anokhi has been suffering from hot flashes and started herself on estrone vera.  This has a rhubarb extract ingredient that apparently includes phytoestrogens.  She is also on a very  low dose of venlafaxine at 37.5 mg daily.  Aside from that she exercises chiefly by walking.  A detailed review of systems was otherwise noncontributory   COVID 19 VACCINATION STATUS: fully vaccinated [employer]), with booster 08/2020   HISTORY OF CURRENT ILLNESS: From the original intake note:  Kalese Ensz has a family history of breast cancer-- her sister was diagnosed at age 51, and a maternal aunt was diagnosed at age 69.   Valyn has been under surveillance for a changing cyst pattern in her right breast. She underwent 12 month follow up bilateral diagnostic mammography on 11/30/2020 showing: breast composition B; no evidence of malignancy in either breast. At that time, her lifetime risk for developing breast cancer was calculated at 28.8%.  The patient's subsequent history is as detailed below.   PAST MEDICAL HISTORY: Past Medical History:  Diagnosis Date   Allergy    Hypersomnia with sleep apnea, unspecified 05/23/2013   OSA on CPAP 08/15/2013   Wears glasses     PAST SURGICAL HISTORY: Past Surgical History:  Procedure Laterality Date   COLONOSCOPY WITH PROPOFOL N/A 12/04/2020   Procedure: COLONOSCOPY WITH PROPOFOL;  Surgeon: Jeani Hawking, MD;  Location: [facility];  Service: Endoscopy;  Laterality: N/A;   HYSTEROSCOPY WITH D & C     WISDOM TOOTH EXTRACTION      FAMILY HISTORY: Family History  Problem Relation Age of Onset   Diabetes Father        type 2   Depression Father    Dementia Father    Narcolepsy Father        cataplexy   High Cholesterol Father    Stroke Maternal Grandmother  Heart disease Maternal Grandfather   The patient's father died at age 5 with complications of Alzheimer's disease and diabetes.  His parents, brother and sister did not have any cancer to the patient's knowledge.  There is a second cousin on the paternal side with a history of lymphoma.  The patient's mother is 63 years old as of March 2022.  Her parents have no history of  cancer.  As she had 9 siblings 1 of whom had gallbladder cancer and the other one had breast cancer in her early 43s.  The patient herself has 1 brother and 2 sisters.  One of her sisters has been diagnosed with breast cancer, and has been genetically tested.  Genetics showed what sounds like a recessive heterozygous polyposis gene.  I do not have a copy of those records.   GYNECOLOGIC HISTORY:  Patient's last menstrual period was 10/05/2017. Menarche: 55 years old GX P 0 LMP February 2022 (menstruation prior to that was 6 months earlier) Contraceptive remote oral contraceptives without complications HRT no  Hysterectomy? no BSO? no   SOCIAL HISTORY: (updated 12/2020)  Jahniah works as an Programmer, multimedia.  She lives alone with [place] and [place], who are shi Tzus.    ADVANCED DIRECTIVES: The patient sister Illene Bolus is her healthcare power of attorney.  Heidi can be reached at [phone]   HEALTH MAINTENANCE: Social History   Tobacco Use   Smoking status: Never   Smokeless tobacco: Never  Substance Use Topics   Alcohol use: No   Drug use: No     Colonoscopy: February 2022/[facility]  PAP: Up-to-date  Bone density: n/a   Allergies  Allergen Reactions   Latex Itching and Rash   Other     Seasonal    Lubricants Itching and Rash    Current Outpatient Medications  Medication Sig Dispense Refill   amphetamine-dextroamphetamine (ADDERALL) 10 MG tablet Take 1 tablet (10 mg total) by mouth 2 (two) times daily with a meal. 60 tablet 0   b complex vitamins capsule Take 1 capsule by mouth daily.     Bepotastine Besilate 1.5 % SOLN Place 1 drop into both eyes daily as needed (allergies).     betamethasone valerate lotion (VALISONE) 0.1 % Apply 1 application topically daily as needed for irritation (scalp).     desonide (DESOWEN) 0.05 % lotion Apply 1 application topically 2 (two) times daily as needed (irritation).     fexofenadine (ALLEGRA) 180 MG tablet Take 180 mg by mouth daily.      gabapentin (NEURONTIN) 100 MG capsule Take 2 capsules (200 mg total) by mouth at bedtime. (Patient taking differently: Take 200 mg by mouth at bedtime as needed (pain).) 180 capsule 3   Multiple Vitamin (MULTIVITAMIN) tablet Take 1 tablet by mouth daily.     pimecrolimus (ELIDEL) 1 % cream Apply 1 application topically 2 (two) times daily as needed (irritation).     Rhubarb (ESTROVEN MENOPAUSE RELIEF PO) Take 1 capsule by mouth daily.     rosuvastatin (CRESTOR) 5 MG tablet Take 5 mg by mouth daily.     triamcinolone (NASACORT) 55 MCG/ACT AERO nasal inhaler Place 2 sprays into the nose 2 (two) times daily as needed (allergies).     venlafaxine XR (EFFEXOR-XR) 37.5 MG 24 hr capsule Take 1 capsule (37.5 mg total) by mouth daily with breakfast. 90 capsule 1   vitamin E 180 MG (400 UNITS) capsule Take 400 Units by mouth daily.     No current facility-administered medications for  this visit.    OBJECTIVE: African-American woman who appears well  There were no vitals filed for this visit.    There is no height or weight on file to calculate BMI.   Wt Readings from Last 3 Encounters:  05/20/21 210 lb (95.3 kg)  01/21/21 215 lb (97.5 kg)  01/04/21 213 lb (96.6 kg)      ECOG FS:1 - Symptomatic but completely ambulatory  Telemedicine visit 06/29/2021   LAB RESULTS:  CMP     Component Value Date/Time   NA 139 01/04/2021 1550   K 4.1 01/04/2021 1550   CL 106 01/04/2021 1550   CO2 26 01/04/2021 1550   GLUCOSE 92 01/04/2021 1550   BUN 14 01/04/2021 1550   CREATININE 1.00 01/04/2021 1550   CALCIUM 8.9 01/04/2021 1550   PROT 7.5 01/04/2021 1550   ALBUMIN 3.9 01/04/2021 1550   AST 21 01/04/2021 1550   ALT 18 01/04/2021 1550   ALKPHOS 65 01/04/2021 1550   BILITOT 0.9 01/04/2021 1550   GFRNONAA >60 01/04/2021 1550    No results found for: TOTALPROTELP, ALBUMINELP, A1GS, A2GS, BETS, BETA2SER, GAMS, MSPIKE, SPEI  Lab Results  Component Value Date   WBC 5.7 01/04/2021   NEUTROABS 3.3  01/04/2021   HGB 13.2 01/04/2021   HCT 40.3 01/04/2021   MCV 98.3 01/04/2021   PLT 341 01/04/2021    No results found for: LABCA2  No components found for: ESPQZR007  No results for input(s): INR in the last 168 hours.  No results found for: LABCA2  No results found for: MAU633  No results found for: HLK562  No results found for: BWL893  No results found for: CA2729  No components found for: HGQUANT  No results found for: CEA1 / No results found for: CEA1   No results found for: AFPTUMOR  No results found for: CHROMOGRNA  No results found for: KPAFRELGTCHN, LAMBDASER, KAPLAMBRATIO (kappa/lambda light chains)  No results found for: HGBA, HGBA2QUANT, HGBFQUANT, HGBSQUAN (Hemoglobinopathy evaluation)   No results found for: LDH  No results found for: IRON, TIBC, IRONPCTSAT (Iron and TIBC)  No results found for: FERRITIN  Urinalysis    Component Value Date/Time   COLORURINE YELLOW 08/30/2007 0915   APPEARANCEUR CLEAR 08/30/2007 0915   LABSPEC 1.015 08/30/2007 0915   PHURINE 7.0 08/30/2007 0915   GLUCOSEU NEGATIVE 08/30/2007 0915   HGBUR NEGATIVE 08/30/2007 0915   BILIRUBINUR NEGATIVE 08/30/2007 0915   KETONESUR NEGATIVE 08/30/2007 0915   PROTEINUR NEGATIVE 08/30/2007 0915   UROBILINOGEN 0.2 08/30/2007 0915   NITRITE NEGATIVE 08/30/2007 0915   LEUKOCYTESUR  08/30/2007 0915    NEGATIVE MICROSCOPIC NOT DONE ON URINES WITH NEGATIVE PROTEIN, BLOOD, LEUKOCYTES, NITRITE, OR GLUCOSE <1000 mg/dL.    STUDIES: No results found.   ELIGIBLE FOR AVAILABLE RESEARCH PROTOCOL: no  ASSESSMENT: 55 y.o. [place] woman at high risk for breast cancer (29% lifetime).  (1) intensified screening started 2022:  (A) bilateral mammography February  (B) breast MRI August  (2) opted against antiestrogens   PLAN: Janeil is already on venlafaxine but a very low dose.  We upped that to 75 mg daily and if that is helpful to her she will let us know and I will call her a  prescription for 75 mg tablets.  We also discussed gabapentin and she will be taking this at 300 mg at bedtime.  The estrone vera pill that she takes supposedly extracted from rhubarb it may include 5 to estrogens and I do not think we have  any data whether this is safe or not in breast cancer patients.  My recommendation would be that she not take it.  Otherwise she will have her mammogram in February and then again her breast MRI in August.  She will see us after the MRI to review results  Total encounter time 20 minutes.Raymond Gurney*  Takhia Spoon C. Khelani Kops, MD 06/28/2021 6:09 PM Medical Oncology and Hematology [facility] [address] [place], [place] [address] Tel. [phone]    Fax. [phone]   This document serves as a record of services personally performed by Ruthann CancerGustav Clell Trahan, MD. It was created on his behalf by Mickie BailKatie Daubenspeck, a trained medical scribe. The creation of this record is based on the scribe's personal observations and the provider's statements to them.   I, Ruthann CancerGustav Kaylean Tupou MD, have reviewed the above documentation for accuracy and completeness, and I agree with the above.   *Total Encounter Time as defined by the Centers for Medicare and Medicaid Services includes, in addition to the face-to-face time of a patient visit (documented in the note above) non-face-to-face time: obtaining and reviewing outside history, ordering and reviewing medications, tests or procedures, care coordination (communications with other health care professionals or caregivers) and documentation in the medical record.

## 2021-06-29 ENCOUNTER — Telehealth (HOSPITAL_BASED_OUTPATIENT_CLINIC_OR_DEPARTMENT_OTHER): Payer: 59 | Admitting: Oncology

## 2021-06-29 DIAGNOSIS — Z1239 Encounter for other screening for malignant neoplasm of breast: Secondary | ICD-10-CM

## 2021-06-30 ENCOUNTER — Telehealth: Payer: 59 | Admitting: Oncology

## 2021-07-26 ENCOUNTER — Other Ambulatory Visit: Payer: Self-pay | Admitting: Family Medicine

## 2021-08-19 ENCOUNTER — Other Ambulatory Visit: Payer: Self-pay

## 2021-08-19 ENCOUNTER — Encounter: Payer: Self-pay | Admitting: Family Medicine

## 2021-08-19 ENCOUNTER — Ambulatory Visit: Payer: Self-pay

## 2021-08-19 ENCOUNTER — Ambulatory Visit (INDEPENDENT_AMBULATORY_CARE_PROVIDER_SITE_OTHER): Payer: 59 | Admitting: Family Medicine

## 2021-08-19 VITALS — BP 100/72 | HR 72 | Ht 65.5 in | Wt 207.0 lb

## 2021-08-19 DIAGNOSIS — M5030 Other cervical disc degeneration, unspecified cervical region: Secondary | ICD-10-CM

## 2021-08-19 DIAGNOSIS — M79641 Pain in right hand: Secondary | ICD-10-CM

## 2021-08-19 DIAGNOSIS — M5412 Radiculopathy, cervical region: Secondary | ICD-10-CM

## 2021-08-19 DIAGNOSIS — M19079 Primary osteoarthritis, unspecified ankle and foot: Secondary | ICD-10-CM | POA: Diagnosis not present

## 2021-08-19 NOTE — Progress Notes (Signed)
[name] [address] [place] [address] Phone: [phone] Subjective:   Alicia [name], am serving as a scribe for Dr. Antoine Primas.  This visit occurred during the SARS-CoV-2 public health emergency.  Safety protocols were in place, including screening questions prior to the visit, additional usage of staff PPE, and extensive cleaning of exam room while observing appropriate contact time as indicated for disinfecting solutions.   I'm seeing this patient by the request  of:  Renaye Rakers, MD  CC: Neck pain, shoulder pain, bilateral foot pain follow-up  [id number]  05/20/2021 Chronic.  No true radicular symptoms at this time we will continue to monitor.  Continue to stay active.  Swimming is good for her.  Follow-up again in 2 to 3 months  Patient does have a trigger thumb.  Discussed with his about possibility of injection.  Patient declined at the moment.  Patient will increase activity slowly.  Follow-up with me again in 2 to 3 months  Update 08/19/2021 Alicia [name] is a 55 y.o. female coming in with complaint of R trigger thumb and neck pain. Patient states that thumb is doing much better. Notes stiffness when grasping pen.   Patient has been swimming which is helping her neck pain. Does have continued pain in the trap musculature. Patient does have numbness in forearm at times and sometimes at tip of thumb on L hand. Pain is not radiating into scapula like it use to.   Right foot pain is improving. Increases in pain depend on footwear.       Past Medical History:  Diagnosis Date   Allergy    Hypersomnia with sleep apnea, unspecified 05/23/2013   OSA on CPAP 08/15/2013   Wears glasses    Past Surgical History:  Procedure Laterality Date   COLONOSCOPY WITH PROPOFOL N/A 12/04/2020   Procedure: COLONOSCOPY WITH PROPOFOL;  Surgeon: Jeani Hawking, MD;  Location: [facility];  Service: Endoscopy;  Laterality: N/A;    HYSTEROSCOPY WITH D & C     WISDOM TOOTH EXTRACTION     Social History   Socioeconomic History   Marital status: Single    Spouse name: Not on file   Number of children: 0   Years of education: Post Grad   Highest education level: Not on file  Occupational History    Comment: Artist  Tobacco Use   Smoking status: Never   Smokeless tobacco: Never  Substance and Sexual Activity   Alcohol use: No   Drug use: No   Sexual activity: Never  Other Topics Concern   Not on file  Social History Narrative   Patient lives at home alone.   Caffeine Use: occasionally soda   Social Determinants of Health   Financial Resource Strain: Not on file  Food Insecurity: Not on file  Transportation Needs: Not on file  Physical Activity: Not on file  Stress: Not on file  Social Connections: Not on file   Allergies  Allergen Reactions   Latex Itching and Rash   Other     Seasonal    Lubricants Itching and Rash   Family History  Problem Relation Age of Onset   Diabetes Father        type 2   Depression Father    Dementia Father    Narcolepsy Father        cataplexy   High Cholesterol Father    Stroke Maternal Grandmother    Heart disease Maternal Grandfather  Current Outpatient Medications (Cardiovascular):    rosuvastatin (CRESTOR) 5 MG tablet, Take 5 mg by mouth daily.  Current Outpatient Medications (Respiratory):    fexofenadine (ALLEGRA) 180 MG tablet, Take 180 mg by mouth daily.   triamcinolone (NASACORT) 55 MCG/ACT AERO nasal inhaler, Place 2 sprays into the nose 2 (two) times daily as needed (allergies).    Current Outpatient Medications (Other):    amphetamine-dextroamphetamine (ADDERALL) 10 MG tablet, Take 1 tablet (10 mg total) by mouth 2 (two) times daily with a meal.   b complex vitamins capsule, Take 1 capsule by mouth daily.   Bepotastine Besilate 1.5 % SOLN, Place 1 drop into both eyes daily as needed (allergies).   betamethasone valerate  lotion (VALISONE) 0.1 %, Apply 1 application topically daily as needed for irritation (scalp).   desonide (DESOWEN) 0.05 % lotion, Apply 1 application topically 2 (two) times daily as needed (irritation).   gabapentin (NEURONTIN) 300 MG capsule, Take 1 capsule (300 mg total) by mouth at bedtime.   Multiple Vitamin (MULTIVITAMIN) tablet, Take 1 tablet by mouth daily.   pimecrolimus (ELIDEL) 1 % cream, Apply 1 application topically 2 (two) times daily as needed (irritation).   Rhubarb (ESTROVEN MENOPAUSE RELIEF PO), Take 1 capsule by mouth daily.   venlafaxine XR (EFFEXOR-XR) 37.5 MG 24 hr capsule, Take 2 capsules (75 mg total) by mouth daily with breakfast.   vitamin E 180 MG (400 UNITS) capsule, Take 400 Units by mouth daily.   Reviewed prior external information including notes and imaging from  primary care provider As well as notes that were available from care everywhere and other healthcare systems.  Past medical history, social, surgical and family history all reviewed in electronic medical record.  No pertanent information unless stated regarding to the chief complaint.   Review of Systems:  No headache, visual changes, nausea, vomiting, diarrhea, constipation, dizziness, abdominal pain, skin rash, fevers, chills, night sweats, weight loss, swollen lymph nodes,  joint swelling, chest pain, shortness of breath, mood changes. POSITIVE muscle aches, body aches and numbness of the hands  Objective  Blood pressure 100/72, pulse 72, height 5' 5.5" (1.664 m), weight 207 lb (93.9 kg), last menstrual period 10/05/2017, SpO2 98 %.   General: No apparent distress alert and oriented x3 mood and affect normal, dressed appropriately.  HEENT: Pupils equal, extraocular movements intact  Respiratory: Patient's speak in full sentences and does not appear short of breath  Cardiovascular: No lower extremity edema, non tender, no erythema  Gait normal with good balance and coordination.  MSK: Neck exam  does have loss of lordosis.  Positive Spurling's maneuver with bilateral C6 radicular symptoms.  Patient does have significant tightness of the trapezius on the left side Right foot exam shows the patient does have some limited range of motion of the first MTP.  Patient does have what appears to be likely effusion noted as well.  Hallux limitus noted.  Breakdown of the transverse arch noted.  Limited muscular skeletal ultrasound was performed and interpreted by Antoine Primas, M  Limited ultrasound of patient's first metatarsal on the right foot shows the patient does have a small effusion noted.  Moderate narrowing of the joint noted patient does have some mild increase in Doppler flow to the proximal aspect of the joint.  Second MTP still has small avulsion noted. Impression: No significant change from previous   Impression and Recommendations:     The above documentation has been reviewed and is accurate and complete Judi Saa,  DO

## 2021-08-19 NOTE — Patient Instructions (Signed)
Epidural [phone] Continue ugly shoes See me again in 6-8 weeks

## 2021-08-19 NOTE — Assessment & Plan Note (Signed)
Degenerative disc disease with I believe patient is having more radicular symptoms of the arms bilaterally.  Seems to be left greater than right.  Patient has responded extremely well to an epidural 2 years ago.  Do feel at this point that this could be beneficial.  Does have the gabapentin as well as the Effexor already.  Chronic problem with exacerbation.  Patient will have the injection and then we will consider the possibility of further changes in medications, trigger point injections, or referral needed for surgical intervention

## 2021-08-19 NOTE — Assessment & Plan Note (Signed)
History of the arthritis of the first toe.  Continuing to have some hypoechoic changes noted on ultrasound today.  Patient declined injection but we will continue to monitor.  If patient has worsening symptoms I do think that this would be the most beneficial.

## 2021-09-03 ENCOUNTER — Ambulatory Visit
Admission: RE | Admit: 2021-09-03 | Discharge: 2021-09-03 | Disposition: A | Discharge status: Home / Self Care | Payer: 59 | Source: Ambulatory Visit | Attending: Family Medicine | Admitting: Family Medicine

## 2021-09-03 ENCOUNTER — Other Ambulatory Visit: Payer: Self-pay

## 2021-09-03 DIAGNOSIS — M5412 Radiculopathy, cervical region: Secondary | ICD-10-CM

## 2021-09-03 MED ORDER — TRIAMCINOLONE ACETONIDE 40 MG/ML IJ SUSP (RADIOLOGY)
60.0000 mg | Freq: Once | INTRAMUSCULAR | Status: AC
  Administered 2021-09-03: 60 mg via EPIDURAL

## 2021-09-03 MED ORDER — IOPAMIDOL (ISOVUE-M 300) INJECTION 61%
1.0000 mL | Freq: Once | INTRAMUSCULAR | Status: AC | PRN
  Administered 2021-09-03: 1 mL via EPIDURAL

## 2021-09-03 NOTE — Discharge Instructions (Signed)

## 2021-09-10 ENCOUNTER — Other Ambulatory Visit: Payer: Self-pay | Admitting: Neurology

## 2021-09-13 MED ORDER — AMPHETAMINE-DEXTROAMPHETAMINE 10 MG PO TABS: 10.0000 mg | ORAL_TABLET | Freq: Two times a day (BID) | ORAL | 0 refills | Status: DC

## 2021-09-29 NOTE — Progress Notes (Signed)
[name] [address] [place] [address] Phone: [phone] Subjective:   Alicia [name], am serving as a scribe for Dr. Antoine Primas.  This visit occurred during the SARS-CoV-2 public health emergency.  Safety protocols were in place, including screening questions prior to the visit, additional usage of staff PPE, and extensive cleaning of exam room while observing appropriate contact time as indicated for disinfecting solutions.   I'm seeing this patient by the request  of:  Renaye Rakers, MD  CC: Right foot pain, neck pain follow-up  [id number]  08/19/2021 History of the arthritis of the first toe.  Continuing to have some hypoechoic changes noted on ultrasound today.  Patient declined injection but we will continue to monitor.  If patient has worsening symptoms I do think that this would be the most beneficial.  Degenerative disc disease with I believe patient is having more radicular symptoms of the arms bilaterally.  Seems to be left greater than right.  Patient has responded extremely well to an epidural 2 years ago.  Do feel at this point that this could be beneficial.  Does have the gabapentin as well as the Effexor already.  Chronic problem with exacerbation.  Patient will have the injection and then we will consider the possibility of further changes in medications, trigger point injections, or referral needed for surgical intervention  Update 09/29/2021 Alicia [name] is a 55 y.o. female coming in with complaint of cervical spine and R great toe pain. Epidural 09/03/2021. Patient states that injection did help. She has not been active but notes less pain than before injection.   R toe pain is improving but still has pain with more narrow and less supportive shoes.  Patient states that overall he does seem to be better.  Does seem to be worse though with patient gaining weight.       Past Medical History:  Diagnosis Date    Allergy    Hypersomnia with sleep apnea, unspecified 05/23/2013   OSA on CPAP 08/15/2013   Wears glasses    Past Surgical History:  Procedure Laterality Date   COLONOSCOPY WITH PROPOFOL N/A 12/04/2020   Procedure: COLONOSCOPY WITH PROPOFOL;  Surgeon: Jeani Hawking, MD;  Location: [facility];  Service: Endoscopy;  Laterality: N/A;   HYSTEROSCOPY WITH D & C     WISDOM TOOTH EXTRACTION     Social History   Socioeconomic History   Marital status: Single    Spouse name: Not on file   Number of children: 0   Years of education: Post Grad   Highest education level: Not on file  Occupational History    Comment: Artist  Tobacco Use   Smoking status: Never   Smokeless tobacco: Never  Substance and Sexual Activity   Alcohol use: No   Drug use: No   Sexual activity: Never  Other Topics Concern   Not on file  Social History Narrative   Patient lives at home alone.   Caffeine Use: occasionally soda   Social Determinants of Health   Financial Resource Strain: Not on file  Food Insecurity: Not on file  Transportation Needs: Not on file  Physical Activity: Not on file  Stress: Not on file  Social Connections: Not on file   Allergies  Allergen Reactions   Latex Itching and Rash   Other     Seasonal    Lubricants Itching and Rash   Family History  Problem Relation Age of Onset  Diabetes Father        type 2   Depression Father    Dementia Father    Narcolepsy Father        cataplexy   High Cholesterol Father    Stroke Maternal Grandmother    Heart disease Maternal Grandfather      Current Outpatient Medications (Cardiovascular):    rosuvastatin (CRESTOR) 5 MG tablet, Take 5 mg by mouth daily.  Current Outpatient Medications (Respiratory):    fexofenadine (ALLEGRA) 180 MG tablet, Take 180 mg by mouth daily.   triamcinolone (NASACORT) 55 MCG/ACT AERO nasal inhaler, Place 2 sprays into the nose 2 (two) times daily as needed (allergies).    Current  Outpatient Medications (Other):    amphetamine-dextroamphetamine (ADDERALL) 10 MG tablet, Take 1 tablet (10 mg total) by mouth 2 (two) times daily with a meal.   b complex vitamins capsule, Take 1 capsule by mouth daily.   Bepotastine Besilate 1.5 % SOLN, Place 1 drop into both eyes daily as needed (allergies).   betamethasone valerate lotion (VALISONE) 0.1 %, Apply 1 application topically daily as needed for irritation (scalp).   desonide (DESOWEN) 0.05 % lotion, Apply 1 application topically 2 (two) times daily as needed (irritation).   gabapentin (NEURONTIN) 300 MG capsule, Take 1 capsule (300 mg total) by mouth at bedtime.   Multiple Vitamin (MULTIVITAMIN) tablet, Take 1 tablet by mouth daily.   pimecrolimus (ELIDEL) 1 % cream, Apply 1 application topically 2 (two) times daily as needed (irritation).   Rhubarb (ESTROVEN MENOPAUSE RELIEF PO), Take 1 capsule by mouth daily.   venlafaxine XR (EFFEXOR-XR) 37.5 MG 24 hr capsule, Take 2 capsules (75 mg total) by mouth daily with breakfast.   vitamin E 180 MG (400 UNITS) capsule, Take 400 Units by mouth daily.   Reviewed prior external information including notes and imaging from  primary care provider As well as notes that were available from care everywhere and other healthcare systems.  Past medical history, social, surgical and family history all reviewed in electronic medical record.  No pertanent information unless stated regarding to the chief complaint.   Review of Systems:  No headache, visual changes, nausea, vomiting, diarrhea, constipation, dizziness, abdominal pain, skin rash, fevers, chills, night sweats, weight loss, swollen lymph nodes,  joint swelling, chest pain, shortness of breath, mood changes. POSITIVE muscle aches, body aches  Objective  Blood pressure 128/78, pulse 68, height 5' 5.5" (1.664 m), weight 217 lb (98.4 kg), last menstrual period 10/05/2017, SpO2 99 %.   General: No apparent distress alert and oriented x3  mood and affect normal, dressed appropriately.  Overweight HEENT: Pupils equal, extraocular movements intact  Respiratory: Patient's speak in full sentences and does not appear short of breath  Cardiovascular: No lower extremity edema, non tender, no erythema  Gait mild antalgic Tender to palpation over the first metatarsal with hallux rigidus noted.  Patient does have trace of effusion noted of the metatarsal joint. Neck exam does have some mild loss of lordosis.  Some tenderness to palpation in the paraspinal musculature.  Negative radicular symptoms today.  Limited muscular skeletal ultrasound was performed and interpreted by Antoine Primas, M  Limited musculoskeletal ultrasound of patient's first MTP still shows arthritic changes noted with narrowing patient does have some hypoechoic changes that show chronic effusion noted. Impression: First MTP arthritis   Impression and Recommendations:     The above documentation has been reviewed and is accurate and complete Judi Saa, DO

## 2021-09-30 ENCOUNTER — Encounter: Payer: Self-pay | Admitting: Family Medicine

## 2021-09-30 ENCOUNTER — Ambulatory Visit (INDEPENDENT_AMBULATORY_CARE_PROVIDER_SITE_OTHER): Payer: 59 | Admitting: Family Medicine

## 2021-09-30 ENCOUNTER — Other Ambulatory Visit: Payer: Self-pay

## 2021-09-30 ENCOUNTER — Ambulatory Visit: Payer: Self-pay

## 2021-09-30 VITALS — BP 128/78 | HR 68 | Ht 65.5 in | Wt 217.0 lb

## 2021-09-30 DIAGNOSIS — M79641 Pain in right hand: Secondary | ICD-10-CM

## 2021-09-30 DIAGNOSIS — M5030 Other cervical disc degeneration, unspecified cervical region: Secondary | ICD-10-CM | POA: Diagnosis not present

## 2021-09-30 DIAGNOSIS — M19079 Primary osteoarthritis, unspecified ankle and foot: Secondary | ICD-10-CM | POA: Diagnosis not present

## 2021-09-30 NOTE — Assessment & Plan Note (Signed)
Improved at this time.  No significant change in medications.  Did respond well to the epidural.  We will can repeat if necessary.  Encourage patient to continue to attempt to increase activity and lose weight.  Did discuss potential healthy weight and wellness which patient declined today.  Follow-up with me again 2 months

## 2021-09-30 NOTE — Patient Instructions (Signed)
Happy [place] you are better Like shoes for your bday, nothing too cute Goal of 10# in 2 months Check back in 2-3 months

## 2021-09-30 NOTE — Assessment & Plan Note (Signed)
Arthritic changes still noted.  Does have some mild hypoechoic changes of the joint noted on ultrasound.  Discussed the proper shoes.  Discussed which activities to do and which ones to avoid.  Increase activity.  Avoiding being barefoot too frequently.  Follow-up with me again in 2 months.

## 2021-12-09 ENCOUNTER — Ambulatory Visit: Payer: 59 | Admitting: Family Medicine

## 2022-01-27 ENCOUNTER — Encounter: Payer: Self-pay | Admitting: Neurology

## 2022-01-27 ENCOUNTER — Ambulatory Visit: Payer: Managed Care, Other (non HMO) | Admitting: Neurology

## 2022-01-27 VITALS — BP 127/89 | HR 71 | Ht 66.0 in | Wt 208.0 lb

## 2022-01-27 DIAGNOSIS — Z9989 Dependence on other enabling machines and devices: Secondary | ICD-10-CM

## 2022-01-27 DIAGNOSIS — G4710 Hypersomnia, unspecified: Secondary | ICD-10-CM | POA: Diagnosis not present

## 2022-01-27 DIAGNOSIS — G4733 Obstructive sleep apnea (adult) (pediatric): Secondary | ICD-10-CM

## 2022-01-27 DIAGNOSIS — Z789 Other specified health status: Secondary | ICD-10-CM

## 2022-01-27 DIAGNOSIS — G4730 Sleep apnea, unspecified: Secondary | ICD-10-CM

## 2022-01-27 MED ORDER — AMPHETAMINE-DEXTROAMPHETAMINE 10 MG PO TABS: 10.0000 mg | ORAL_TABLET | Freq: Two times a day (BID) | ORAL | 0 refills | Status: DC

## 2022-01-27 NOTE — Patient Instructions (Signed)
It was nice to see you again today.  Please continue with the Adderall as needed.  If you have trouble filling the prescription due to a shortage, we can try you on Provigil, please email me through MyChart if the need arises.  Please continue with your autoPAP consistently, as you have.  Try nasal cushion mask sample, if you like it, let me know, we can send a prescription to your DME provider.  Please follow-up in 1 year routinely, sooner if needed.  ?

## 2022-01-27 NOTE — Progress Notes (Signed)
Subjective:  ?  ?Patient ID: Alicia [name] is a 56 y.o. female. ? ?HPI ? ? ? ?Interim history:  ? ?Alicia [name] is a very pleasant 56 year old right-handed woman with an underlying medical history of allergies, hyperlipidemia, and obstructive sleep apnea on CPAP with residual daytime somnolence, who presents for follow-up consultation of her sleep apnea. The patient is unaccompanied today and presents for her 1 year check-up. I last saw her on 01/21/2021, at which time she was compliant with her AutoPap.  She tried nasal pillows and then a nasal mask because the pills were irritating her nasal passages.  She was using Adderall as needed. ? ?Today, 01/27/2022 reviewed her AutoPap compliance data from 12/27/2021 through 01/25/2022, which is a total of 30 days, during which time she used her machine every night with percent use days greater than 4 hours at 97%, indicating excellent compliance with an average usage of 7 hours and 47 minutes, residual AHI mildly elevated at 9.1, with a central respiratory index of 3.4/h.  Leak acceptable with a 95th percentile at 15 L/min, average pressure of 12.1 cm with a range of 7 to 14 cm.  She reports that the nasal mask does not fit right.  She has had difficulty tolerating nasal pillows, she is currently using the Eson nasal mask from [employer], size medium.  Prior nasal pillows were also from [employer].  I was able to provide her with a sample for the Evora nasal cushion interface from [employer].  She has used her Adderall sparingly.  She generally uses 10 mg once daily as needed.  She is not sure if she can fill it as there is a shortage Adderall 10 mg strength.  She would like to try Provigil if the need arises.  She was previously not able to maintain on Provigil because of cost.  We could try this again if she is not able to fill her Adderall.  She has had some interim stressors, sadly, she lost her nephew last year after a brief but very serious illness.  She  traveled to [place] with her family but her mom got COVID and they had to cut their visit shorter than expected.  She herself did not get COVID. ?  ?The patient's allergies, current medications, family history, past medical history, past social history, past surgical history and problem list were reviewed and updated as appropriate.  ?  ?Previously (copied from previous notes for reference):  ?  ?  ?I saw her on 03/26/20, at which time she reported doing well.  She was on Adderall 10 mg once daily, had some issues with insomnia at times.  She was followed by sports medicine for her neck pain.  She called in the interim in October 2021 reporting that she had residual issues with her CPAP and that she felt it was not effectively treating her sleep apnea.  Compliance download did show elevated AHI with mainly obstructive events.  She was advised to proceed with a home sleep test.  Home sleep test for reevaluation of her sleep apnea on 10/06/2020 which indicated severe obstructive sleep apnea with an AHI of 64.4/h, O2 nadir 79%.  She was advised to start AutoPap machine. ?  ?I reviewed her AutoPap compliance data from the past 30 days from 12/21/2020 through 01/19/2021, which is a total of 30 days, during which time she used her machine every night with percent use days greater than 4 hours at 100%, indicating superb compliance with an average usage of 7  hours and 36 minutes, residual AHI mildly elevated at 8.4/h, 95th percentile of pressure at 12.7 cm with a range of 7 to 14 cm, leak on the low side with a 95th percentile at 4.6 L/min.   ? ?I saw her on 03/25/2019 in a virtual visit, at which time she was compliant with her CPAP.  She had ongoing issues with neck pain and was supposed to get a epidural steroid injection in her neck soon.  She was advised to follow-up routinely in 1 year. ?  ?I reviewed her CPAP compliance data from 02/24/2020 through 03/24/2020, which is a total of 30 days, during which time she used her machine  every night with percent use days greater than 4 hours at 97%, indicating excellent compliance with an average usage of 7 hours and 16 minutes, residual AHI mildly elevated at 9.3 with primarily central apneas, leak acceptable, 95th percentile at 8.4 L/min, pressure of 11 cm with EPR of 2.   ?  ?I saw her on 07/17/2018, at which time she was compliant with her CPAP.  She was taking Adderall 20 mg strength generic half a pill to 1 pill daily on average.  We mutually agreed to change her prescription to 10 mg strength so she would not have to break the 20 mg in half. ?  ?I reviewed her CPAP compliance data from 02/19/2019 through 03/20/2019 which is a total of 30 days, during which time she used her machine every night with percent used days greater than 4 hours at 100%, indicating superb compliance with an average usage of 8 hours and 3 minutes which is very good, residual AHI slightly elevated at 7.8/h, leak on the low end, with a 95th percentile at 6.6 L/min on a pressure of 11 cm with EPR of 2 ?  ?I saw her on 01/11/2018, at which time she was fully compliant with her CPAP. She was doing quite well on her stimulant but was not always taking her second dose. She had significant neck pain and had been to physical therapy, had injection under neurosurgery as well. I suggested we maintain her low dose Adderall generic 10 mg twice a day. ?  ?I reviewed her CPAP compliance data from 06/12/2018 through 07/11/2018 which is a total of 30 days, during which time she used her CPAP 29 days with percent used days greater than 4 hours at 90%, indicating excellent compliance with an average usage of 6 hours and 57 minutes, residual AHI borderline at 4.9 per hour, leak acceptable with the 95th percentile at 8.6 L/m on a pressure of 11 cm with EPR of 2.  ?  ?I reviewed her CPAP compliance data from 12/09/2017 through 01/07/2018 which is a total of 30 days, during which time she used her CPAP every night with percent used days greater  than 4 hours at 100%, indicating superb compliance with an average usage of 7 hours and 14 minutes, residual AHI borderline at 5.9 per hour, leaked low with the 95th percentile at 4.4 L/m on a pressure of 10 cm with EPR of 2. ?  ?I saw her on 07/13/2017, at which time she was compliant with CPAP. She had issues with neck pain and went through physical therapy with dry needling. She was supposed to see orthopedics She was doing reasonably well on her Adderall and was compliant with CPAP therapy. I suggested a six-month follow-up.  ?  ?I saw her on 01/05/2017, at which time she was compliant with her CPAP.  She had occasional difficulty maintaining sleep at night, was attributing this to taking the Adderall. She was trying to be careful not to take it too late after lunch. I suggested we continue Adderall immediate release 10 mg twice a day.  ?  ?I reviewed her CPAP compliance data from 06/11/2017 through 07/10/2017 which is a total of 30 days, during which time she used her CPAP every night with percent used days greater than 4 hours at 100%, indicating superb compliance with an average usage of 7 hours and 11 minutes, residual AHI borderline at 6.4 per hour, leak acceptable with the 95th percentile at 12.6 L/m on a pressure of 10 cm with EPR of 2.  ?  ?I reviewed her CPAP compliance data from 12/06/2016 through 01/04/2017 which is a total of 30 days, during which time she used her CPAP every night with percent used days greater than 4 hours at 100%, indicating superb compliance with an average usage of 6 hours and 29 minutes, residual AHI at goal at 3.2 per hour, leak low with the 95th percentile at 3.1 L/m on a pressure of 10 cm with EPR of 2.   ?  ?I saw her on 07/07/2016 after a gap of nearly 2 years, at which time she reported doing okay, weight had been fluctuating, but was going to the gym on a fairly regular basis. She was fully compliant with CPAP, still had sleepiness during the day especially in the  afternoons, particularly after eating a carb-laden meal. Other than that, she was stable, taking a vitamin D supplement and other supplements, vitamin D was low per her report and she was told to start taki

## 2022-05-11 ENCOUNTER — Other Ambulatory Visit: Payer: Self-pay | Admitting: Hematology and Oncology

## 2022-05-11 ENCOUNTER — Other Ambulatory Visit: Payer: Self-pay | Admitting: *Deleted

## 2022-05-11 ENCOUNTER — Other Ambulatory Visit: Payer: Self-pay | Admitting: Oncology

## 2022-05-11 DIAGNOSIS — Z1239 Encounter for other screening for malignant neoplasm of breast: Secondary | ICD-10-CM

## 2022-05-12 ENCOUNTER — Telehealth: Payer: Self-pay | Admitting: Oncology

## 2022-05-12 NOTE — Telephone Encounter (Signed)
Scheduled appointment per los. Left message with appointment details.

## 2022-05-31 ENCOUNTER — Other Ambulatory Visit: Payer: Self-pay

## 2022-06-06 ENCOUNTER — Other Ambulatory Visit: Payer: Self-pay

## 2022-06-13 ENCOUNTER — Telehealth: Payer: Self-pay | Admitting: Hematology and Oncology

## 2022-06-13 NOTE — Telephone Encounter (Signed)
Rescheduled appointment per provider PAL. Patient is aware of the changes made to her upcoming appointment. 

## 2022-06-14 ENCOUNTER — Ambulatory Visit
Admission: RE | Admit: 2022-06-14 | Discharge: 2022-06-14 | Disposition: A | Discharge status: Home / Self Care | Payer: Commercial Managed Care - HMO | Source: Ambulatory Visit | Attending: Hematology and Oncology | Admitting: Hematology and Oncology

## 2022-06-14 DIAGNOSIS — Z1239 Encounter for other screening for malignant neoplasm of breast: Secondary | ICD-10-CM

## 2022-06-14 MED ORDER — GADOBUTROL 1 MMOL/ML IV SOLN
10.0000 mL | Freq: Once | INTRAVENOUS | Status: AC | PRN
Start: 2022-06-14 — End: 2022-06-14
  Administered 2022-06-14: 10 mL via INTRAVENOUS

## 2022-06-17 ENCOUNTER — Other Ambulatory Visit: Payer: Self-pay | Admitting: Gastroenterology

## 2022-06-24 ENCOUNTER — Inpatient Hospital Stay: Payer: Commercial Managed Care - HMO | Attending: Hematology and Oncology | Admitting: Hematology and Oncology

## 2022-06-24 ENCOUNTER — Telehealth: Payer: Self-pay | Admitting: Hematology and Oncology

## 2022-06-24 NOTE — Assessment & Plan Note (Deleted)
Lifetime risk of breast cancer: 29% Breast cancer surveillance: 1.  Breast exam 06/24/2022: 2. Mammogram 12/02/2021: Benign breast density category B 3.  Breast MRI 06/14/2022: Benign breast density category B  Decided against antiestrogen therapy for breast reduction. Plan: Continue mammograms alternating with MRIs  Turn to clinic in 1 year for follow-up

## 2022-06-24 NOTE — Telephone Encounter (Signed)
Called patient regarding 09/01 scheduled message, patient has been called and voicemail was left. 

## 2022-07-01 ENCOUNTER — Telehealth: Payer: Self-pay | Admitting: Hematology and Oncology

## 2022-07-01 ENCOUNTER — Ambulatory Visit: Payer: Self-pay | Admitting: Hematology and Oncology

## 2022-07-01 NOTE — Telephone Encounter (Signed)
Per 9/8 in basket, message has been left

## 2022-07-15 ENCOUNTER — Encounter (HOSPITAL_COMMUNITY): Payer: Self-pay | Admitting: Gastroenterology

## 2022-07-19 ENCOUNTER — Encounter (HOSPITAL_COMMUNITY): Payer: Self-pay | Admitting: Gastroenterology

## 2022-07-22 ENCOUNTER — Ambulatory Visit (HOSPITAL_BASED_OUTPATIENT_CLINIC_OR_DEPARTMENT_OTHER): Payer: Commercial Managed Care - HMO | Admitting: Registered Nurse

## 2022-07-22 ENCOUNTER — Ambulatory Visit (HOSPITAL_COMMUNITY): Payer: Commercial Managed Care - HMO | Admitting: Registered Nurse

## 2022-07-22 ENCOUNTER — Encounter (HOSPITAL_COMMUNITY)
Admission: RE | Disposition: A | Discharge status: Home / Self Care | Payer: Self-pay | Source: Home / Self Care | Attending: Gastroenterology

## 2022-07-22 ENCOUNTER — Other Ambulatory Visit: Payer: Self-pay

## 2022-07-22 ENCOUNTER — Ambulatory Visit (HOSPITAL_COMMUNITY)
Admission: RE | Admit: 2022-07-22 | Discharge: 2022-07-22 | Disposition: A | Discharge status: Home / Self Care | Payer: Commercial Managed Care - HMO | Attending: Gastroenterology | Admitting: Gastroenterology

## 2022-07-22 DIAGNOSIS — K5730 Diverticulosis of large intestine without perforation or abscess without bleeding: Secondary | ICD-10-CM | POA: Diagnosis not present

## 2022-07-22 DIAGNOSIS — Z9989 Dependence on other enabling machines and devices: Secondary | ICD-10-CM | POA: Diagnosis not present

## 2022-07-22 DIAGNOSIS — Z1211 Encounter for screening for malignant neoplasm of colon: Secondary | ICD-10-CM | POA: Diagnosis present

## 2022-07-22 DIAGNOSIS — G4730 Sleep apnea, unspecified: Secondary | ICD-10-CM | POA: Insufficient documentation

## 2022-07-22 DIAGNOSIS — E669 Obesity, unspecified: Secondary | ICD-10-CM | POA: Insufficient documentation

## 2022-07-22 DIAGNOSIS — G4733 Obstructive sleep apnea (adult) (pediatric): Secondary | ICD-10-CM

## 2022-07-22 HISTORY — PX: COLONOSCOPY WITH PROPOFOL: SHX5780

## 2022-07-22 SURGERY — COLONOSCOPY WITH PROPOFOL
Anesthesia: Monitor Anesthesia Care

## 2022-07-22 MED ORDER — PROPOFOL 1000 MG/100ML IV EMUL
INTRAVENOUS | Status: AC
  Filled 2022-07-22: qty 100

## 2022-07-22 MED ORDER — PROPOFOL 500 MG/50ML IV EMUL
INTRAVENOUS | Status: DC | PRN
  Administered 2022-07-22: 20 mg via INTRAVENOUS
  Administered 2022-07-22: 150 ug/kg/min via INTRAVENOUS

## 2022-07-22 MED ORDER — LACTATED RINGERS IV SOLN
INTRAVENOUS | Status: DC
  Administered 2022-07-22: 1000 mL via INTRAVENOUS

## 2022-07-22 MED ORDER — PROPOFOL 500 MG/50ML IV EMUL
INTRAVENOUS | Status: AC
  Filled 2022-07-22: qty 50

## 2022-07-22 MED ORDER — LIDOCAINE 2% (20 MG/ML) 5 ML SYRINGE
INTRAMUSCULAR | Status: DC | PRN
  Administered 2022-07-22: 60 mg via INTRAVENOUS

## 2022-07-22 MED ORDER — SODIUM CHLORIDE 0.9 % IV SOLN: INTRAVENOUS | Status: DC

## 2022-07-22 SURGICAL SUPPLY — 22 items

## 2022-07-22 NOTE — Transfer of Care (Signed)
Immediate Anesthesia Transfer of Care Note  Patient: Alicia [name]  Procedure(s) Performed: COLONOSCOPY WITH PROPOFOL  Patient Location: PACU and Endoscopy Unit  Anesthesia Type:MAC  Level of Consciousness: awake, alert , oriented and patient cooperative  Airway & Oxygen Therapy: Patient Spontanous Breathing and Patient connected to face mask oxygen  Post-op Assessment: Report given to RN, Post -op Vital signs reviewed and stable and Patient moving all extremities  Post vital signs: Reviewed and stable  Last Vitals:  Vitals Value Taken Time  BP    Temp    Pulse 70 07/22/22 0759  Resp 15 07/22/22 0759  SpO2 100 % 07/22/22 0759  Vitals shown include unvalidated device data.  Last Pain:  Vitals:   07/22/22 0647  TempSrc: Oral  PainSc: 0-No pain         Complications: No notable events documented.

## 2022-07-22 NOTE — H&P (Signed)
Alicia [name] HPI: This 56 year old black female presents to the office for a 1 year repeat colorectal cancer screening. Her last colonoscopy done on 12/04/2020 revealed diverticulosis in the sigmoid and descending colon;, a poor preparation was noted.  She has 1-2 BM's per day with no obvious blood or mucus in the stool. She has a good appetite and has lost 7 pounds in the last 18 months. She denies having any complaints of abdominal pain, nausea, vomiting, acid reflux, dysphagia or odynophagia. She denies having a family history of colon cancer, celiac sprue or IBD.  Past Medical History:  Diagnosis Date   Allergy    Hypersomnia with sleep apnea, unspecified 05/23/2013   OSA on CPAP 08/15/2013   Wears glasses     Past Surgical History:  Procedure Laterality Date   COLONOSCOPY WITH PROPOFOL N/A 12/04/2020   Procedure: COLONOSCOPY WITH PROPOFOL;  Surgeon: Carol Ada, MD;  Location: [facility];  Service: Endoscopy;  Laterality: N/A;   HYSTEROSCOPY WITH D & C     WISDOM TOOTH EXTRACTION      Family History  Problem Relation Age of Onset   Sleep apnea Mother    Diabetes Father        type 2   Depression Father    Dementia Father    Narcolepsy Father        cataplexy   High Cholesterol Father    Stroke Maternal Grandmother    Heart disease Maternal Grandfather     Social History:  reports that she has never smoked. She has never used smokeless tobacco. She reports that she does not drink alcohol and does not use drugs.  Allergies:  Allergies  Allergen Reactions   Latex Itching and Rash   Other     Seasonal    Lubricants Itching and Rash    Medications: Scheduled: Continuous:  sodium chloride     lactated ringers 10 mL/hr at 07/22/22 0713    No results found for this or any previous visit (from the past 24 hour(s)).   No results found.  ROS:  As stated above in the HPI otherwise negative.  Blood pressure 117/73, pulse 61, temperature 97.9 F (36.6 C),  temperature source Oral, resp. rate 15, height 5\' 6"  (1.676 m), weight 95.3 kg, last menstrual period 10/05/2017, SpO2 97 %.    PE: Gen: NAD, Alert and Oriented HEENT:  [place]/AT, EOMI Neck: Supple, no LAD Lungs: CTA Bilaterally CV: RRR without M/G/R ABD: Soft, NTND, +BS Ext: No C/C/E  Assessment/Plan: 1) Screening colonoscopy.  Alicia [name] D 07/22/2022, 7:21 AM

## 2022-07-22 NOTE — Anesthesia Postprocedure Evaluation (Signed)
Anesthesia Post Note  Patient: Alicia [name]  Procedure(s) Performed: COLONOSCOPY WITH PROPOFOL     Patient location during evaluation: PACU Anesthesia Type: MAC Level of consciousness: awake and alert Pain management: pain level controlled Vital Signs Assessment: post-procedure vital signs reviewed and stable Respiratory status: spontaneous breathing, nonlabored ventilation and respiratory function stable Cardiovascular status: blood pressure returned to baseline and stable Postop Assessment: no apparent nausea or vomiting Anesthetic complications: no   No notable events documented.  Last Vitals:  Vitals:   07/22/22 0815 07/22/22 0820  BP:  138/81  Pulse: (!) 59 (!) 57  Resp: 13 16  Temp:    SpO2: 98% 99%    Last Pain:  Vitals:   07/22/22 0820  TempSrc:   PainSc: 0-No pain                 Lynda Rainwater

## 2022-07-22 NOTE — Discharge Instructions (Signed)

## 2022-07-22 NOTE — Op Note (Signed)
[facility] Patient Name: Alicia [name] Procedure Date: 07/22/2022 MRN: [id number] Attending MD: Carol Ada , MD Date of Birth: 01-Feb-1966 CSN: [id number] Age: 56 Admit Type: Outpatient Procedure:                Colonoscopy Indications:              Screening for colorectal malignant neoplasm Providers:                Carol Ada, MD, Elmer Ramp. Tilden Dome, RN, Luan Moore, Technician, Brien Mates, RNFA, Victoriano Lain, CRNA Referring MD:              Medicines:                 Complications:            No immediate complications. Estimated Blood Loss:     Estimated blood loss: none. Procedure:                Pre-Anesthesia Assessment:                           - Prior to the procedure, a History and Physical                            was performed, and patient medications and                            allergies were reviewed. The patient's tolerance of                            previous anesthesia was also reviewed. The risks                            and benefits of the procedure and the sedation                            options and risks were discussed with the patient.                            All questions were answered, and informed consent                            was obtained. Prior Anticoagulants: The patient has                            taken no previous anticoagulant or antiplatelet                            agents. ASA Grade Assessment: III - A patient with                            severe  systemic disease. After reviewing the risks                            and benefits, the patient was deemed in                            satisfactory condition to undergo the procedure.                           - Sedation was administered by an anesthesia                            professional. Deep sedation was attained.                           After obtaining informed consent, the  colonoscope                            was passed under direct vision. Throughout the                            procedure, the patient's blood pressure, pulse, and                            oxygen saturations were monitored continuously. The                            [id number]) Olympus colonoscope was                            introduced through the anus and advanced to the the                            cecum, identified by appendiceal orifice and                            ileocecal valve. The colonoscopy was performed                            without difficulty. The patient tolerated the                            procedure well. The quality of the bowel                            preparation was evaluated using the BBPS [facility]                            Bowel Preparation Scale) with scores of: Right                            Colon = 3 (entire mucosa seen well with no residual  staining, small fragments of stool or opaque                            liquid), Transverse Colon = 3 (entire mucosa seen                            well with no residual staining, small fragments of                            stool or opaque liquid) and Left Colon = 3 (entire                            mucosa seen well with no residual staining, small                            fragments of stool or opaque liquid). The total                            BBPS score equals 9. The quality of the bowel                            preparation was good. The ileocecal valve,                            appendiceal orifice, and rectum were photographed. Scope In: [id number] Scope Out: 7:53:01 AM Scope Withdrawal Time: 0 hours 12 minutes 0 seconds  Total Procedure Duration: 0 hours 16 minutes 2 seconds  Findings:      A few medium-mouthed diverticula were found in the sigmoid colon. Impression:               - Diverticulosis in the sigmoid colon.                           - No  specimens collected. Moderate Sedation:      Not Applicable - Patient had care per Anesthesia. Recommendation:           - Patient has a contact number available for                            emergencies. The signs and symptoms of potential                            delayed complications were discussed with the                            patient. Return to normal activities tomorrow.                            Written discharge instructions were provided to the                            patient.                           -  Resume previous diet.                           - Continue present medications.                           - Repeat colonoscopy in 10 years for screening                            purposes. Procedure Code(s):        --- Professional ---                           [phone], Colonoscopy, flexible; diagnostic, including                            collection of specimen(s) by brushing or washing,                            when performed (separate procedure) Diagnosis Code(s):        --- Professional ---                           Z12.11, Encounter for screening for malignant                            neoplasm of colon                           K57.30, Diverticulosis of large intestine without                            perforation or abscess without bleeding CPT copyright 2019 American Medical Association. All rights reserved. The codes documented in this report are preliminary and upon coder review may  be revised to meet current compliance requirements. Carol Ada, MD Carol Ada, MD 07/22/2022 8:02:02 AM This report has been signed electronically. Number of Addenda: 0

## 2022-07-22 NOTE — Anesthesia Preprocedure Evaluation (Signed)
Anesthesia Evaluation  Patient identified by MRN, date of birth, ID band Patient awake    Reviewed: Allergy & Precautions, NPO status , Patient's Chart, lab work & pertinent test results  Airway Mallampati: I  TM Distance: >3 FB Neck ROM: Full    Dental no notable dental hx.    Pulmonary sleep apnea and Continuous Positive Airway Pressure Ventilation ,    Pulmonary exam normal breath sounds clear to auscultation       Cardiovascular negative cardio ROS Normal cardiovascular exam Rhythm:Regular Rate:Normal     Neuro/Psych  Neuromuscular disease negative psych ROS   GI/Hepatic Neg liver ROS,   Endo/Other  negative endocrine ROS  Renal/GU negative Renal ROS     Musculoskeletal  (+) Arthritis , Osteoarthritis,    Abdominal (+) + obese,   Peds  Hematology negative hematology ROS (+)   Anesthesia Other Findings   Reproductive/Obstetrics                             Anesthesia Physical  Anesthesia Plan  ASA: II  Anesthesia Plan: MAC   Post-op Pain Management: Minimal or no pain anticipated   Induction: Intravenous  PONV Risk Score and Plan: 2 and Propofol infusion, Ondansetron and Treatment may vary due to age or medical condition  Airway Management Planned: Simple Face Mask  Additional Equipment: None  Intra-op Plan:   Post-operative Plan:   Informed Consent: I have reviewed the patients History and Physical, chart, labs and discussed the procedure including the risks, benefits and alternatives for the proposed anesthesia with the patient or authorized representative who has indicated his/her understanding and acceptance.       Plan Discussed with: CRNA  Anesthesia Plan Comments:         Anesthesia Quick Evaluation

## 2022-07-24 NOTE — Progress Notes (Unsigned)
Patient Care Team: Lucianne Lei, MD as PCP - General (Family Medicine) Magrinat, Virgie Dad, MD (Inactive) as Consulting Physician (Oncology) Crawford Givens, MD as Consulting Physician (Obstetrics and Gynecology) Carol Ada, MD as Consulting Physician (Gastroenterology)  DIAGNOSIS: No diagnosis found.  SUMMARY OF ONCOLOGIC HISTORY: Oncology History   No history exists.    CHIEF COMPLIANT: Follow-up right breast cancer and Establish oncology care with Dr. Lindi Adie   INTERVAL HISTORY: Alicia [name] is a 56 y o with the above mentioned. She presents to the clinic for a follow-up and Establish oncology care with Dr. Lindi Adie.     ALLERGIES:  is allergic to latex, other, and lubricants.  MEDICATIONS:  Current Outpatient Medications  Medication Sig Dispense Refill   amphetamine-dextroamphetamine (ADDERALL) 10 MG tablet Take 1 tablet (10 mg total) by mouth 2 (two) times daily with a meal. (Patient taking differently: Take 10 mg by mouth daily with breakfast.) 60 tablet 0   b complex vitamins capsule Take 1 capsule by mouth daily.     Bepotastine Besilate 1.5 % SOLN Place 1 drop into both eyes daily as needed (allergies).     betamethasone valerate lotion (VALISONE) 0.1 % Apply 1 application topically daily as needed for irritation (scalp).     Biotin w/ Vitamins C & E (HAIR SKIN & NAILS GUMMIES PO) Take 2 each by mouth daily.     cholecalciferol (VITAMIN D3) 25 MCG (1000 UNIT) tablet Take 3,000 Units by mouth daily.     desonide (DESOWEN) 0.05 % lotion Apply 1 application topically 2 (two) times daily as needed (irritation).     fexofenadine (ALLEGRA) 180 MG tablet Take 180 mg by mouth daily as needed for allergies.     gabapentin (NEURONTIN) 300 MG capsule Take 1 capsule (300 mg total) by mouth at bedtime.     magnesium oxide (MAG-OX) 400 (240 Mg) MG tablet Take 400 mg by mouth daily.     Multiple Vitamin (MULTIVITAMIN) tablet Take 1 tablet by mouth daily.     Multiple Vitamin  (STRESS FORMULA) TABS Take 1 each by mouth daily.     Omega-3 Fatty Acids (FISH OIL) 1000 MG CAPS Take by mouth.     pimecrolimus (ELIDEL) 1 % cream Apply 1 application topically 2 (two) times daily as needed (irritation).     Tart Cherry 1200 MG CAPS Take 1,200 mg by mouth daily.     triamcinolone (NASACORT) 55 MCG/ACT AERO nasal inhaler Place 2 sprays into the nose 2 (two) times daily as needed (allergies).     No current facility-administered medications for this visit.    PHYSICAL EXAMINATION: ECOG PERFORMANCE STATUS: {CHL ONC ECOG [phone]}  There were no vitals filed for this visit. There were no vitals filed for this visit.  BREAST:*** No palpable masses or nodules in either right or left breasts. No palpable axillary supraclavicular or infraclavicular adenopathy no breast tenderness or nipple discharge. (exam performed in the presence of a chaperone)  LABORATORY DATA:  I have reviewed the data as listed    Latest Ref Rng & Units 01/04/2021    3:50 PM  CMP  Glucose 70 - 99 mg/dL 92   BUN 6 - 20 mg/dL 14   Creatinine 0.44 - 1.00 mg/dL 1.00   Sodium 135 - 145 mmol/L 139   Potassium 3.5 - 5.1 mmol/L 4.1   Chloride 98 - 111 mmol/L 106   CO2 22 - 32 mmol/L 26   Calcium 8.9 - 10.3 mg/dL 8.9   Total Protein  6.5 - 8.1 g/dL 7.5   Total Bilirubin 0.3 - 1.2 mg/dL 0.9   Alkaline Phos 38 - 126 U/L 65   AST 15 - 41 U/L 21   ALT 0 - 44 U/L 18     Lab Results  Component Value Date   WBC 5.7 01/04/2021   HGB 13.2 01/04/2021   HCT 40.3 01/04/2021   MCV 98.3 01/04/2021   PLT 341 01/04/2021   NEUTROABS 3.3 01/04/2021    ASSESSMENT & PLAN:  No problem-specific Assessment & Plan notes found for this encounter.    No orders of the defined types were placed in this encounter.  The patient has a good understanding of the overall plan. she agrees with it. she will call with any problems that may develop before the next visit here. Total time spent: 30 mins including face to  face time and time spent for planning, charting and co-ordination of care   Sherlyn Lick, CMA 07/24/22    I Janan Ridge am scribing for Dr. Pamelia Hoit  ***

## 2022-07-25 ENCOUNTER — Encounter (HOSPITAL_COMMUNITY): Payer: Self-pay | Admitting: Gastroenterology

## 2022-07-26 ENCOUNTER — Other Ambulatory Visit: Payer: Self-pay

## 2022-07-26 ENCOUNTER — Inpatient Hospital Stay: Payer: Commercial Managed Care - HMO | Attending: Hematology and Oncology | Admitting: Hematology and Oncology

## 2022-07-26 DIAGNOSIS — Z9189 Other specified personal risk factors, not elsewhere classified: Secondary | ICD-10-CM | POA: Diagnosis not present

## 2022-07-26 DIAGNOSIS — R9230 Dense breasts, unspecified: Secondary | ICD-10-CM | POA: Insufficient documentation

## 2022-07-26 NOTE — Assessment & Plan Note (Addendum)
Dr. Virgie Dad patient establishing oncology care Lifetime risk of breast cancer: 29% Patient decided not to take any antiestrogen therapy  Breast cancer surveillance: 1.  Breast exam 07/26/2022: Benign 2. mammogram February 2022 at [facility]: Benign 3.  Breast MRI 06/14/2022: Benign breast density category B We discussed about obtaining a breast MRI every other year as opposed to annually going forwards.  This is to decrease the effect of gadolinium on brain.  Return to clinic in 1 year for follow-up

## 2022-08-09 ENCOUNTER — Other Ambulatory Visit: Payer: Self-pay | Admitting: Neurology

## 2022-08-16 MED ORDER — AMPHETAMINE-DEXTROAMPHETAMINE 10 MG PO TABS: 10.0000 mg | ORAL_TABLET | Freq: Two times a day (BID) | ORAL | 0 refills | Status: DC

## 2022-08-16 NOTE — Telephone Encounter (Signed)
Last visit 01/27/22 Next visit 02/02/23 Per  registry, last filled on 01/27/2022 #60/30. Pt uses as needed. Rx refill sent to Dr Rexene Alberts for e-scribe.

## 2022-11-02 NOTE — Progress Notes (Deleted)
[place] [place] Phone: [phone] Subjective:    I'm seeing this patient by the request  of:  Lucianne Lei, MD  CC:   [id number]  Alicia [name] is a 57 y.o. female coming in with complaint of wrist and back pain. Last seen in 2022 for 1st MTP arthritis and neck pain. Patient states      Past Medical History:  Diagnosis Date   Allergy    Hypersomnia with sleep apnea, unspecified 05/23/2013   OSA on CPAP 08/15/2013   Wears glasses    Past Surgical History:  Procedure Laterality Date   COLONOSCOPY WITH PROPOFOL N/A 12/04/2020   Procedure: COLONOSCOPY WITH PROPOFOL;  Surgeon: Carol Ada, MD;  Location: [facility];  Service: Endoscopy;  Laterality: N/A;   COLONOSCOPY WITH PROPOFOL N/A 07/22/2022   Procedure: COLONOSCOPY WITH PROPOFOL;  Surgeon: Carol Ada, MD;  Location: [facility];  Service: Gastroenterology;  Laterality: N/A;   HYSTEROSCOPY WITH D & C     WISDOM TOOTH EXTRACTION     Social History   Socioeconomic History   Marital status: Single    Spouse name: Not on file   Number of children: 0   Years of education: Post Grad   Highest education level: Not on file  Occupational History    Comment: Aeronautical engineer  Tobacco Use   Smoking status: Never   Smokeless tobacco: Never  Substance and Sexual Activity   Alcohol use: No   Drug use: No   Sexual activity: Never  Other Topics Concern   Not on file  Social History Narrative   Patient lives at home alone.   Caffeine Use: occasionally soda   Social Determinants of Health   Financial Resource Strain: Not on file  Food Insecurity: Not on file  Transportation Needs: Not on file  Physical Activity: Not on file  Stress: Not on file  Social Connections: Not on file   Allergies  Allergen Reactions   Latex Itching and Rash   Other     Seasonal    Lubricants Itching and Rash   Family History  Problem Relation Age of Onset    Sleep apnea Mother    Diabetes Father        type 2   Depression Father    Dementia Father    Narcolepsy Father        cataplexy   High Cholesterol Father    Stroke Maternal Grandmother    Heart disease Maternal Grandfather       Current Outpatient Medications (Respiratory):    fexofenadine (ALLEGRA) 180 MG tablet, Take 180 mg by mouth daily as needed for allergies.   triamcinolone (NASACORT) 55 MCG/ACT AERO nasal inhaler, Place 2 sprays into the nose 2 (two) times daily as needed (allergies).   Current Outpatient Medications (Hematological):    Cobalamin Combinations (B-12) [phone] MCG SUBL,   Current Outpatient Medications (Other):    amphetamine-dextroamphetamine (ADDERALL) 10 MG tablet, Take 1 tablet (10 mg total) by mouth 2 (two) times daily with a meal.   b complex vitamins capsule, Take 1 capsule by mouth daily.   Bepotastine Besilate 1.5 % SOLN, Place 1 drop into both eyes daily as needed (allergies).   betamethasone valerate lotion (VALISONE) 0.1 %, Apply 1 application topically daily as needed for irritation (scalp).   Biotin w/ Vitamins C & E (HAIR SKIN & NAILS GUMMIES PO), Take 2 each by mouth daily.   cholecalciferol (VITAMIN D3) 25 MCG (  1000 UNIT) tablet, Take 3,000 Units by mouth daily.   desonide (DESOWEN) 0.05 % lotion, Apply 1 application topically 2 (two) times daily as needed (irritation).   gabapentin (NEURONTIN) 300 MG capsule, Take 1 capsule (300 mg total) by mouth at bedtime.   magnesium oxide (MAG-OX) 400 (240 Mg) MG tablet, Take 400 mg by mouth daily.   Multiple Vitamin (MULTIVITAMIN) tablet, Take 1 tablet by mouth daily.   Multiple Vitamin (STRESS FORMULA) TABS, Take 1 each by mouth daily.   Omega-3 Fatty Acids (FISH OIL) 1000 MG CAPS, Take by mouth.   pimecrolimus (ELIDEL) 1 % cream, Apply 1 application topically 2 (two) times daily as needed (irritation).   Tart Cherry 1200 MG CAPS, Take 1,200 mg by mouth daily.   Reviewed prior external  information including notes and imaging from  primary care provider As well as notes that were available from care everywhere and other healthcare systems.  Past medical history, social, surgical and family history all reviewed in electronic medical record.  No pertanent information unless stated regarding to the chief complaint.   Review of Systems:  No headache, visual changes, nausea, vomiting, diarrhea, constipation, dizziness, abdominal pain, skin rash, fevers, chills, night sweats, weight loss, swollen lymph nodes, body aches, joint swelling, chest pain, shortness of breath, mood changes. POSITIVE muscle aches  Objective  Last menstrual period 10/05/2017.   General: No apparent distress alert and oriented x3 mood and affect normal, dressed appropriately.  HEENT: Pupils equal, extraocular movements intact  Respiratory: Patient's speak in full sentences and does not appear short of breath  Cardiovascular: No lower extremity edema, non tender, no erythema      Impression and Recommendations:

## 2022-11-07 DIAGNOSIS — Z6834 Body mass index (BMI) 34.0-34.9, adult: Secondary | ICD-10-CM | POA: Diagnosis not present

## 2022-11-07 DIAGNOSIS — Z1231 Encounter for screening mammogram for malignant neoplasm of breast: Secondary | ICD-10-CM | POA: Diagnosis not present

## 2022-11-07 DIAGNOSIS — Z1211 Encounter for screening for malignant neoplasm of colon: Secondary | ICD-10-CM | POA: Diagnosis not present

## 2022-11-07 DIAGNOSIS — N760 Acute vaginitis: Secondary | ICD-10-CM | POA: Diagnosis not present

## 2022-11-07 DIAGNOSIS — Z124 Encounter for screening for malignant neoplasm of cervix: Secondary | ICD-10-CM | POA: Diagnosis not present

## 2022-11-07 DIAGNOSIS — Z01419 Encounter for gynecological examination (general) (routine) without abnormal findings: Secondary | ICD-10-CM | POA: Diagnosis not present

## 2022-11-07 DIAGNOSIS — N898 Other specified noninflammatory disorders of vagina: Secondary | ICD-10-CM | POA: Diagnosis not present

## 2022-11-07 DIAGNOSIS — N951 Menopausal and female climacteric states: Secondary | ICD-10-CM | POA: Diagnosis not present

## 2022-11-08 ENCOUNTER — Ambulatory Visit: Payer: Commercial Managed Care - HMO | Admitting: Family Medicine

## 2022-11-22 DIAGNOSIS — N898 Other specified noninflammatory disorders of vagina: Secondary | ICD-10-CM | POA: Diagnosis not present

## 2022-11-22 DIAGNOSIS — N760 Acute vaginitis: Secondary | ICD-10-CM | POA: Diagnosis not present

## 2022-12-05 DIAGNOSIS — Z1231 Encounter for screening mammogram for malignant neoplasm of breast: Secondary | ICD-10-CM | POA: Diagnosis not present

## 2022-12-06 ENCOUNTER — Encounter: Payer: Self-pay | Admitting: Hematology and Oncology

## 2023-01-05 NOTE — Progress Notes (Signed)
Zach Alesana [facility] [place] [address] [place] Phone: [phone] Subjective:   IVilma Meckel, am serving as a scribe for Dr. Hulan Saas.  I'm seeing this patient by the request  of:  Lucianne Lei, MD  CC: Neck and thumb pain  TIR:[id number]  09/30/2021 Arthritic changes still noted. Does have some mild hypoechoic changes of the joint noted on ultrasound. Discussed the proper shoes. Discussed which activities to do and which ones to avoid. Increase activity. Avoiding being barefoot too frequently. Follow-up with me again in 2 months   Improved at this time. No significant change in medications. Did respond well to the epidural. We will can repeat if necessary. Encourage patient to continue to attempt to increase activity and lose weight. Did discuss potential healthy weight and wellness which patient declined today. Follow-up with me again 2 months   Updated 01/09/2023 Anisah Kuck is a 57 y.o. female coming in with complaint of neck and thumb pain. Having some L thumb pain in the [facility] joint. Neck feels okay today, just had a massage. Foot is a bit bothersome.  Nothing as severe as what it has been previously.      Past Medical History:  Diagnosis Date   Allergy    Hypersomnia with sleep apnea, unspecified 05/23/2013   OSA on CPAP 08/15/2013   Wears glasses    Past Surgical History:  Procedure Laterality Date   COLONOSCOPY WITH PROPOFOL N/A 12/04/2020   Procedure: COLONOSCOPY WITH PROPOFOL;  Surgeon: Carol Ada, MD;  Location: [facility];  Service: Endoscopy;  Laterality: N/A;   COLONOSCOPY WITH PROPOFOL N/A 07/22/2022   Procedure: COLONOSCOPY WITH PROPOFOL;  Surgeon: Carol Ada, MD;  Location: [facility];  Service: Gastroenterology;  Laterality: N/A;   HYSTEROSCOPY WITH D & C     WISDOM TOOTH EXTRACTION     Social History   Socioeconomic History   Marital status: Single    Spouse name: Not on file   Number of children: 0    Years of education: Post Grad   Highest education level: Not on file  Occupational History    Comment: Aeronautical engineer  Tobacco Use   Smoking status: Never   Smokeless tobacco: Never  Substance and Sexual Activity   Alcohol use: No   Drug use: No   Sexual activity: Never  Other Topics Concern   Not on file  Social History Narrative   Patient lives at home alone.   Caffeine Use: occasionally soda   Social Determinants of Health   Financial Resource Strain: Not on file  Food Insecurity: Not on file  Transportation Needs: Not on file  Physical Activity: Not on file  Stress: Not on file  Social Connections: Not on file   Allergies  Allergen Reactions   Latex Itching and Rash   Other     Seasonal    Lubricants Itching and Rash   Family History  Problem Relation Age of Onset   Sleep apnea Mother    Diabetes Father        type 2   Depression Father    Dementia Father    Narcolepsy Father        cataplexy   High Cholesterol Father    Stroke Maternal Grandmother    Heart disease Maternal Grandfather       Current Outpatient Medications (Respiratory):    fexofenadine (ALLEGRA) 180 MG tablet, Take 180 mg by mouth daily as needed for allergies.   triamcinolone (NASACORT) 55  MCG/ACT AERO nasal inhaler, Place 2 sprays into the nose 2 (two) times daily as needed (allergies).   Current Outpatient Medications (Hematological):    Cobalamin Combinations (B-12) [phone] MCG SUBL,   Current Outpatient Medications (Other):    amphetamine-dextroamphetamine (ADDERALL) 10 MG tablet, Take 1 tablet (10 mg total) by mouth 2 (two) times daily with a meal.   b complex vitamins capsule, Take 1 capsule by mouth daily.   Bepotastine Besilate 1.5 % SOLN, Place 1 drop into both eyes daily as needed (allergies).   betamethasone valerate lotion (VALISONE) 0.1 %, Apply 1 application topically daily as needed for irritation (scalp).   Biotin w/ Vitamins C & E (HAIR SKIN & NAILS GUMMIES  PO), Take 2 each by mouth daily.   cholecalciferol (VITAMIN D3) 25 MCG (1000 UNIT) tablet, Take 3,000 Units by mouth daily.   desonide (DESOWEN) 0.05 % lotion, Apply 1 application topically 2 (two) times daily as needed (irritation).   gabapentin (NEURONTIN) 300 MG capsule, Take 1 capsule (300 mg total) by mouth at bedtime.   magnesium oxide (MAG-OX) 400 (240 Mg) MG tablet, Take 400 mg by mouth daily.   Multiple Vitamin (MULTIVITAMIN) tablet, Take 1 tablet by mouth daily.   Multiple Vitamin (STRESS FORMULA) TABS, Take 1 each by mouth daily.   Omega-3 Fatty Acids (FISH OIL) 1000 MG CAPS, Take by mouth.   pimecrolimus (ELIDEL) 1 % cream, Apply 1 application topically 2 (two) times daily as needed (irritation).   Tart Cherry 1200 MG CAPS, Take 1,200 mg by mouth daily.   Reviewed prior external information including notes and imaging from  primary care provider As well as notes that were available from care everywhere and other healthcare systems.  Past medical history, social, surgical and family history all reviewed in electronic medical record.  No pertanent information unless stated regarding to the chief complaint.   Review of Systems:  No headache, visual changes, nausea, vomiting, diarrhea, constipation, dizziness, abdominal pain, skin rash, fevers, chills, night sweats, weight loss, swollen lymph nodes, body aches, joint swelling, chest pain, shortness of breath, mood changes. POSITIVE muscle aches  Objective  Last menstrual period 10/05/2017.   General: No apparent distress alert and oriented x3 mood and affect normal, dressed appropriately.  HEENT: Pupils equal, extraocular movements intact  Respiratory: Patient's speak in full sentences and does not appear short of breath  Cardiovascular: No lower extremity edema, non tender, no erythema  Neck exam shows mild loss of lordosis noted.  Some tenderness to palpation in the paraspinal musculature of the neck noted.  Patient's foot exam  still shows some limited range of motion of the first MTP on the right foot.  Patient has had trigger thumb noted and does have a trigger nodule noted at the A2 pulley of the left thumb.  Right thumb is unremarkable  Procedure: Real-time Ultrasound Guided Injection of left flexor tendon sheath Device: GE Logiq Q7 Ultrasound guided injection is preferred based studies that show increased duration, increased effect, greater accuracy, decreased procedural pain, increased response rate, and decreased cost with ultrasound guided versus blind injection.  Verbal informed consent obtained.  Time-out conducted.  Noted no overlying erythema, induration, or other signs of local infection.  Skin prepped in a sterile fashion.  Local anesthesia: Topical Ethyl chloride.  With sterile technique and under real time ultrasound guidance: With a 25-gauge half inch needle injected with 0.5 cc of 0.5% Marcaine and 0.5 cc of Kenalog 40 mg/mL Completed without difficulty  Pain immediately resolved  suggesting accurate placement of the medication.  Advised to call if fevers/chills, erythema, induration, drainage, or persistent bleeding.  Impression: Technically successful ultrasound guided injection.    Impression and Recommendations:     The above documentation has been reviewed and is accurate and complete Lyndal Pulley, DO

## 2023-01-09 ENCOUNTER — Encounter: Payer: Self-pay | Admitting: Family Medicine

## 2023-01-09 ENCOUNTER — Other Ambulatory Visit: Payer: Self-pay

## 2023-01-09 ENCOUNTER — Ambulatory Visit: Payer: 59 | Admitting: Family Medicine

## 2023-01-09 VITALS — BP 122/72 | HR 78 | Ht 66.0 in | Wt 214.0 lb

## 2023-01-09 DIAGNOSIS — M65312 Trigger thumb, left thumb: Secondary | ICD-10-CM | POA: Diagnosis not present

## 2023-01-09 DIAGNOSIS — M5030 Other cervical disc degeneration, unspecified cervical region: Secondary | ICD-10-CM | POA: Diagnosis not present

## 2023-01-09 DIAGNOSIS — M65311 Trigger thumb, right thumb: Secondary | ICD-10-CM

## 2023-01-09 DIAGNOSIS — M19079 Primary osteoarthritis, unspecified ankle and foot: Secondary | ICD-10-CM | POA: Diagnosis not present

## 2023-01-09 NOTE — Patient Instructions (Signed)
Maybe brace at night Get back to the good shoes See you again in 2-3 months

## 2023-01-09 NOTE — Assessment & Plan Note (Signed)
DDD discussed HEP  Discussed with patient that if worsening symptoms can consider the possibility of injections if necessary.

## 2023-01-09 NOTE — Assessment & Plan Note (Signed)
Patient given injection and tolerated the procedure well, discussed bracing at night, patient has had similar presentation on the contralateral side and has been doing relatively well.  Discussed with patient about avoiding the repetitive motions and possibly getting a voice recognition software.  Follow-up again in 6 to 8 weeks

## 2023-01-09 NOTE — Assessment & Plan Note (Signed)
Stable at the moment.  Will continue to monitor.  Worsening pain can consider injection.  Patient will try to go back to her better shoes.

## 2023-01-17 DIAGNOSIS — G4733 Obstructive sleep apnea (adult) (pediatric): Secondary | ICD-10-CM | POA: Diagnosis not present

## 2023-01-26 ENCOUNTER — Encounter: Payer: Self-pay | Admitting: Neurology

## 2023-01-26 ENCOUNTER — Ambulatory Visit: Payer: 59 | Admitting: Neurology

## 2023-01-26 VITALS — BP 120/72 | HR 58 | Ht 66.0 in | Wt 213.8 lb

## 2023-01-26 DIAGNOSIS — G4730 Sleep apnea, unspecified: Secondary | ICD-10-CM

## 2023-01-26 DIAGNOSIS — G4710 Hypersomnia, unspecified: Secondary | ICD-10-CM | POA: Diagnosis not present

## 2023-01-26 DIAGNOSIS — G4733 Obstructive sleep apnea (adult) (pediatric): Secondary | ICD-10-CM

## 2023-01-26 MED ORDER — AMPHETAMINE-DEXTROAMPHETAMINE 10 MG PO TABS: 10.0000 mg | ORAL_TABLET | Freq: Every day | ORAL | 0 refills | Status: DC | PRN

## 2023-01-26 NOTE — Patient Instructions (Signed)
It was good to see you again. Please continue using your CPAP regularly. While your insurance requires that you use CPAP at least 4 hours each night on 70% of the nights, I recommend, that you not skip any nights and use it throughout the night if you can. Getting used to CPAP and staying with the treatment long term does take time and patience and discipline. Untreated obstructive sleep apnea when it is moderate to severe can have an adverse impact on cardiovascular health and raise her risk for heart disease, arrhythmias, hypertension, congestive heart failure, stroke and diabetes. Untreated obstructive sleep apnea causes sleep disruption, nonrestorative sleep, and sleep deprivation. This can have an impact on your day to day functioning and cause daytime sleepiness and impairment of cognitive function, memory loss, mood disturbance, and problems focussing. Using CPAP regularly can improve these symptoms. We can see you in 1 year, you can see one of our nurse practitioners as you are stable.

## 2023-01-26 NOTE — Progress Notes (Signed)
Subjective:    Patient ID: Alicia [name] is a 57 y.o. female.  HPI    Interim history:    Alicia [name] is a 57 year old woman with an underlying medical history of allergies, hyperlipidemia, and mild obesity, who presents for follow-up consultation of her sleep apnea, associated with hypersomnolence. The patient is unaccompanied today and presents for her 1 year check-up. I last saw her on 01/27/2022, at which time she was compliant with her AutoPap machine.  She had difficulty tolerating the nasal pillows.  She was given a nasal cushion interface as a sample to try at home.  She was using Adderall as needed, sparingly, and typically utilizes using 10 mg once daily as needed.  We talked about retrying Provigil if she were to encounter delays in filling her Adderall prescription due to shortage.   Today, 01/26/2023: I reviewed her AutoPap compliance data from 12/26/2022 through 01/24/2023, which is a total of 30 days, during which time she used her machine every night with percent use days greater than 4 hours at 97%, indicating excellent compliance, average usage of 6 hours and 44 minutes, residual AHI mildly elevated at 10/h, 95th percentile of pressure at 12.5 cm with a range of 7 to 14 cm with EPR of 2.  She does have some central events.  Leak acceptable with the 95th percentile at 9.8 L/min, central respiratory index at 4.3/h.  Epworth sleepiness score is 7 out of 24.  She reports overall doing well, she is using nasal pillows from [employer] but does have some trouble keeping the mask in place particularly because the headgear is difficult to stay in place due to her thicker hair.  She does try to get the headgear strap either higher up or very low, she also has to use a chinstrap to keep her job from opening.  She uses Adderall generally once daily and it is helpful.  She has no new concerns or complaints today.     The patient's allergies, current medications, family history, past medical  history, past social history, past surgical history and problem list were reviewed and updated as appropriate.    Previously (copied from previous notes for reference):    I saw her on 01/21/2021, at which time she was compliant with her AutoPap.  She tried nasal pillows and then a nasal mask because the pills were irritating her nasal passages.  She was using Adderall as needed.   I reviewed her AutoPap compliance data from 12/27/2021 through 01/25/2022, which is a total of 30 days, during which time she used her machine every night with percent use days greater than 4 hours at 97%, indicating excellent compliance with an average usage of 7 hours and 47 minutes, residual AHI mildly elevated at 9.1, with a central respiratory index of 3.4/h.  Leak acceptable with a 95th percentile at 15 L/min, average pressure of 12.1 cm with a range of 7 to 14 cm.      I saw her on 03/26/20, at which time she reported doing well.  She was on Adderall 10 mg once daily, had some issues with insomnia at times.  She was followed by sports medicine for her neck pain.  She called in the interim in October 2021 reporting that she had residual issues with her CPAP and that she felt it was not effectively treating her sleep apnea.  Compliance download did show elevated AHI with mainly obstructive events.  She was advised to proceed with a home sleep test.  Home sleep test for reevaluation of her sleep apnea on 10/06/2020 which indicated severe obstructive sleep apnea with an AHI of 64.4/h, O2 nadir 79%.  She was advised to start AutoPap machine.   I reviewed her AutoPap compliance data from the past 30 days from 12/21/2020 through 01/19/2021, which is a total of 30 days, during which time she used her machine every night with percent use days greater than 4 hours at 100%, indicating superb compliance with an average usage of 7 hours and 36 minutes, residual AHI mildly elevated at 8.4/h, 95th percentile of pressure at 12.7 cm with a range  of 7 to 14 cm, leak on the low side with a 95th percentile at 4.6 L/min.     I saw her on 03/25/2019 in a virtual visit, at which time she was compliant with her CPAP.  She had ongoing issues with neck pain and was supposed to get a epidural steroid injection in her neck soon.  She was advised to follow-up routinely in 1 year.   I reviewed her CPAP compliance data from 02/24/2020 through 03/24/2020, which is a total of 30 days, during which time she used her machine every night with percent use days greater than 4 hours at 97%, indicating excellent compliance with an average usage of 7 hours and 16 minutes, residual AHI mildly elevated at 9.3 with primarily central apneas, leak acceptable, 95th percentile at 8.4 L/min, pressure of 11 cm with EPR of 2.     I saw her on 07/17/2018, at which time she was compliant with her CPAP.  She was taking Adderall 20 mg strength generic half a pill to 1 pill daily on average.  We mutually agreed to change her prescription to 10 mg strength so she would not have to break the 20 mg in half.   I reviewed her CPAP compliance data from 02/19/2019 through 03/20/2019 which is a total of 30 days, during which time she used her machine every night with percent used days greater than 4 hours at 100%, indicating superb compliance with an average usage of 8 hours and 3 minutes which is very good, residual AHI slightly elevated at 7.8/h, leak on the low end, with a 95th percentile at 6.6 L/min on a pressure of 11 cm with EPR of 2   I saw her on 01/11/2018, at which time she was fully compliant with her CPAP. She was doing quite well on her stimulant but was not always taking her second dose. She had significant neck pain and had been to physical therapy, had injection under neurosurgery as well. I suggested we maintain her low dose Adderall generic 10 mg twice a day.   I reviewed her CPAP compliance data from 06/12/2018 through 07/11/2018 which is a total of 30 days, during which time she  used her CPAP 29 days with percent used days greater than 4 hours at 90%, indicating excellent compliance with an average usage of 6 hours and 57 minutes, residual AHI borderline at 4.9 per hour, leak acceptable with the 95th percentile at 8.6 L/m on a pressure of 11 cm with EPR of 2.    I reviewed her CPAP compliance data from 12/09/2017 through 01/07/2018 which is a total of 30 days, during which time she used her CPAP every night with percent used days greater than 4 hours at 100%, indicating superb compliance with an average usage of 7 hours and 14 minutes, residual AHI borderline at 5.9 per hour, leaked low with the  95th percentile at 4.4 L/m on a pressure of 10 cm with EPR of 2.   I saw her on 07/13/2017, at which time she was compliant with CPAP. She had issues with neck pain and went through physical therapy with dry needling. She was supposed to see orthopedics She was doing reasonably well on her Adderall and was compliant with CPAP therapy. I suggested a six-month follow-up.    I saw her on 01/05/2017, at which time she was compliant with her CPAP. She had occasional difficulty maintaining sleep at night, was attributing this to taking the Adderall. She was trying to be careful not to take it too late after lunch. I suggested we continue Adderall immediate release 10 mg twice a day.    I reviewed her CPAP compliance data from 06/11/2017 through 07/10/2017 which is a total of 30 days, during which time she used her CPAP every night with percent used days greater than 4 hours at 100%, indicating superb compliance with an average usage of 7 hours and 11 minutes, residual AHI borderline at 6.4 per hour, leak acceptable with the 95th percentile at 12.6 L/m on a pressure of 10 cm with EPR of 2.    I reviewed her CPAP compliance data from 12/06/2016 through 01/04/2017 which is a total of 30 days, during which time she used her CPAP every night with percent used days greater than 4 hours at 100%,  indicating superb compliance with an average usage of 6 hours and 29 minutes, residual AHI at goal at 3.2 per hour, leak low with the 95th percentile at 3.1 L/m on a pressure of 10 cm with EPR of 2.     I saw her on 07/07/2016 after a gap of nearly 2 years, at which time she reported doing okay, weight had been fluctuating, but was going to the gym on a fairly regular basis. She was fully compliant with CPAP, still had sleepiness during the day especially in the afternoons, particularly after eating a carb-laden meal. Other than that, she was stable, taking a vitamin D supplement and other supplements, vitamin D was low per her report and she was told to start taking over-the-counter vit D per PCP. She had not tried the Adderall that I suggested at the last visit and had stopped taking the Nuvigil even though it worked okay because of was too expensive. I suggested starting her on Adderall immediate release 10 mg once daily in the early afternoon. She emailed in the interim in November 2017, reporting being a little bit more focused during the day and not overeating as much but as far as daytime somnolence she did not notice any telltale was palms. I suggested we increase the Adderall to 10 mg twice daily, second dose no later than 3 PM to avoid nighttime insomnia.   I saw her on 08/21/2014, at which time she reported doing well on Nuvigil 150 mg once daily and she felt that it was better than the Provigil generic. She had some residual sleepiness but felt improved overall. She was compliant with CPAP therapy. I suggested we try low-dose Adderall as needed for residual sleepiness.   I reviewed her CPAP compliance data from 06/07/2016 through 07/06/2016 which is a total of 30 days, during which time she used her machine every night with percent used days greater than 4 hours at 100%, indicating superb compliance with an average usage of 7 hours and 4 minutes, residual AHI 4.1 per hour, leak low for the 95th  percentile at 2.6 L/m on a pressure of 10 cm with EPR of 2.   I saw her on 02/13/2014, at which time she continued to endorse daytime somnolence. She had just been approved for Nuvigil but had not started it yet. She was fully compliant with CPAP therapy.   I reviewed her compliance data from the compliance card on her machine: 07/22/2014 through 08/20/2014 which is a total of 30 days during which time she used her machine every night with percent used days greater than 4 hours of 97%, indicating excellent compliance, pressure still at 10 cm with EPR of 2. Residual AHI acceptable at 4.9 per hour and leak generally low with the 95th percentile of leak at 6.2 L/m, average usage of 7 hours and 19 minutes. She is compliant with her CPAP machine, she has a [employer] nasal pillows. She has exacerbation of her allergies and also facial eczema. She has recently seen a dermatologist and was given a new ointment.   I saw her on 08/15/2013, at which time we talked about her sleep test results from 2013 and her CPAP compliance which was excellent. She has a family history of narcolepsy with cataplexy in her father. She felt Provigil as needed was helpful. She had trouble remembering the second dose of Provigil and therefore I suggested a trial of Nuvigil once daily. She was approved for this.    I first met her on 05/23/2013, at which time she reported being veyr sleepy despite being compliant with CPAP therapy. I suggested that she try Provigil to help stay awake during the day a little better, starting with 200 mg strength half a pill up to twice daily as needed. I advised her not to take it after 3 PM to avoid insomnia at night.  She has a FHx of narcolepsy with cataplexy in her father and was particularly concerned that she may have narcolepsy.    I had reviewed records including a recent CPAP compliance download from 04/02/2013 through 05/22/2013, total of 51 days, during which time she used CPAP every day.  Percent used days greater than 4 hours was 96% indicating excellent compliance. Her CPAP pressure is 10 cm water pressure with an EPR of 2. Residual AHI is 4.7 indicating an appropriate pressure level. Average usage was 6:41 hours.    I reviewed a sleep study report from 11/10/2011. This was a split-night study. REM percentage was 17% and the baseline portion of the study. Her AHI was 61.9 per hour with an oxyhemoglobin desaturation nadir of 75%. She was titrated on CPAP from 4-10 cm of water pressure with elimination of her sleep disordered breathing reported.    She uses a nasal pillows mask. I also reviewed older compliance data from August to Nov. 2013, during which time she was very compliant but had a high leak. At the time, she was using a FFM and leak was much better with the new mask.        Her Past Medical History Is Significant For: Past Medical History:  Diagnosis Date   Allergy    Hypersomnia with sleep apnea, unspecified 05/23/2013   OSA on CPAP 08/15/2013   Wears glasses     Her Past Surgical History Is Significant For: Past Surgical History:  Procedure Laterality Date   COLONOSCOPY WITH PROPOFOL N/A 12/04/2020   Procedure: COLONOSCOPY WITH PROPOFOL;  Surgeon: Carol Ada, MD;  Location: [facility];  Service: Endoscopy;  Laterality: N/A;   COLONOSCOPY WITH PROPOFOL N/A 07/22/2022  Procedure: COLONOSCOPY WITH PROPOFOL;  Surgeon: Carol Ada, MD;  Location: [facility];  Service: Gastroenterology;  Laterality: N/A;   HYSTEROSCOPY WITH D & C     WISDOM TOOTH EXTRACTION      Her Family History Is Significant For: Family History  Problem Relation Age of Onset   Sleep apnea Mother    Diabetes Father        type 2   Depression Father    Dementia Father    Narcolepsy Father        cataplexy   High Cholesterol Father    Stroke Maternal Grandmother    Heart disease Maternal Grandfather     Her Social History Is Significant For: Social History   Socioeconomic  History   Marital status: Single    Spouse name: Not on file   Number of children: 0   Years of education: Post Grad   Highest education level: Not on file  Occupational History    Comment: Aeronautical engineer  Tobacco Use   Smoking status: Never   Smokeless tobacco: Never  Substance and Sexual Activity   Alcohol use: No   Drug use: No   Sexual activity: Never  Other Topics Concern   Not on file  Social History Narrative   Patient lives at home alone.   Caffeine Use: occasionally soda   Social Determinants of Health   Financial Resource Strain: Not on file  Food Insecurity: Not on file  Transportation Needs: Not on file  Physical Activity: Not on file  Stress: Not on file  Social Connections: Not on file    Her Allergies Are:  Allergies  Allergen Reactions   Latex Itching and Rash   Other     Seasonal    Lubricants Itching and Rash  :   Her Current Medications Are:  Outpatient Encounter Medications as of 01/26/2023  Medication Sig   amphetamine-dextroamphetamine (ADDERALL) 10 MG tablet Take 1 tablet (10 mg total) by mouth 2 (two) times daily with a meal.   b complex vitamins capsule Take 1 capsule by mouth daily.   Bepotastine Besilate 1.5 % SOLN Place 1 drop into both eyes daily as needed (allergies).   betamethasone valerate lotion (VALISONE) 0.1 % Apply 1 application topically daily as needed for irritation (scalp).   Biotin w/ Vitamins C & E (HAIR SKIN & NAILS GUMMIES PO) Take 2 each by mouth daily.   cholecalciferol (VITAMIN D3) 25 MCG (1000 UNIT) tablet Take 3,000 Units by mouth daily.   Cobalamin Combinations (B-12) [phone] MCG SUBL    desonide (DESOWEN) 0.05 % lotion Apply 1 application topically 2 (two) times daily as needed (irritation).   fexofenadine (ALLEGRA) 180 MG tablet Take 180 mg by mouth daily as needed for allergies.   gabapentin (NEURONTIN) 300 MG capsule Take 1 capsule (300 mg total) by mouth at bedtime.   magnesium oxide (MAG-OX) 400 (240 Mg)  MG tablet Take 400 mg by mouth daily.   Multiple Vitamin (MULTIVITAMIN) tablet Take 1 tablet by mouth daily.   Multiple Vitamin (STRESS FORMULA) TABS Take 1 each by mouth daily.   Omega-3 Fatty Acids (FISH OIL) 1000 MG CAPS Take by mouth.   pimecrolimus (ELIDEL) 1 % cream Apply 1 application topically 2 (two) times daily as needed (irritation).   Tart Cherry 1200 MG CAPS Take 1,200 mg by mouth daily.   triamcinolone (NASACORT) 55 MCG/ACT AERO nasal inhaler Place 2 sprays into the nose 2 (two) times daily as needed (allergies).   No  facility-administered encounter medications on file as of 01/26/2023.  :  Review of Systems:  Out of a complete 14 point review of systems, all are reviewed and negative with the exception of these symptoms as listed below:  Review of Systems  Neurological:        Here for cpap follow up. ESS 7.  Mask issues.     Objective:  Neurological Exam  Physical Exam Physical Examination:   Vitals:   01/26/23 0802  BP: 120/72  Pulse: (!) 58    General Examination: The patient is a very pleasant 57 y.o. female in no acute distress. She appears well-developed and well-nourished and well groomed.   HEENT: Normocephalic, atraumatic, pupils are equal, round and reactive to light, extraocular tracking is well-preserved, corrective eyeglasses in place.  Hearing grossly intact, face is symmetric with normal facial animation.  She was wearing a mask but we removed it for the oropharynx examination, findings are stable.  Tongue protrudes centrally and palate elevates symmetrically.  No carotid bruits.    Chest: Clear to auscultation without wheezing, rhonchi or crackles noted.   Heart: S1+S2+0, regular and normal without murmurs, rubs or gallops noted.    Abdomen: Soft, non-tender and non-distended.   Extremities: There is no obvious edema in the distal lower extremities bilaterally.    Skin: Warm and dry without trophic changes noted.   Musculoskeletal: exam  reveals no obvious joint deformities.    Neurologically:  Mental status: The patient is awake, alert and oriented in all 4 spheres. Her immediate and remote memory, attention, language skills and fund of knowledge are appropriate. There is no evidence of aphasia, agnosia, apraxia or anomia. Speech is clear with normal prosody and enunciation. Thought process is linear. Mood is normal and affect is normal.  Cranial nerves II - XII are as described above under HEENT exam.  Motor exam: Normal bulk, strength and tone is noted. There is no obvious postural or action tremor.  Fine motor skills and coordination: Grossly intact.  Cerebellar testing: No dysmetria or intention tremor. There is no truncal or gait ataxia.  Sensory exam: intact to light touch in the upper and lower extremities.  Gait, station and balance: She stands easily. No veering to one side is noted. No leaning to one side is noted. Posture is age-appropriate and stance is narrow based. Gait shows normal stride length and normal pace. No problems turning are noted.    Assessment and Plan:  In summary, Davinna Degregory is a very pleasant 57 year old female with an underlying medical history of allergies and obesity, who presents for follow-up consultation of her OSA and hypersomnolence disorder.  She had a home sleep test in December 2021, which indicated severe obstructive sleep apnea and she was advised to start AutoPap therapy.  She did not get a new machine at the time.  She continues to have some trouble tolerating the nasal pillows, she has tried different interfaces.  For her daytime somnolence she continues to take Adderall 10 mg once daily.  She is advised to continue with her AutoPap at the current settings and her Adderall once daily as needed. She is advised to follow-up routinely in this clinic in 1 year, sooner if needed.  She can see one of our nurse practitioners.  I answered all her questions today and she was in agreement  with the plan.   I spent 20 minutes in total face-to-face time and in reviewing records during pre-charting, more than 50% of which was  spent in counseling and coordination of care, reviewing test results, reviewing medications and treatment regimen and/or in discussing or reviewing the diagnosis of OSA, the prognosis and treatment options. Pertinent laboratory and imaging test results that were available during this visit with the patient were reviewed by me and considered in my medical decision making (see chart for details). '

## 2023-02-02 ENCOUNTER — Ambulatory Visit: Payer: Managed Care, Other (non HMO) | Admitting: Neurology

## 2023-02-06 DIAGNOSIS — J301 Allergic rhinitis due to pollen: Secondary | ICD-10-CM | POA: Diagnosis not present

## 2023-02-06 DIAGNOSIS — J3081 Allergic rhinitis due to animal (cat) (dog) hair and dander: Secondary | ICD-10-CM | POA: Diagnosis not present

## 2023-02-06 DIAGNOSIS — J3089 Other allergic rhinitis: Secondary | ICD-10-CM | POA: Diagnosis not present

## 2023-03-09 NOTE — Progress Notes (Signed)
[name] [address] [place] [address] Phone: [phone] Subjective:    I'm seeing this patient by the request  of:  Renaye Rakers, MD  CC: Pain all over  [id number]  01/09/2023 DDD discussed HEP  Discussed with patient that if worsening symptoms can consider the possibility of injections if necessary.  Stable at the moment. Will continue to monitor. Worsening pain can consider injection. Patient will try to go back to her better shoes.   Patient given injection and tolerated the procedure well, discussed bracing at night, patient has had similar presentation on the contralateral side and has been doing relatively well.  Discussed with patient about avoiding the repetitive motions and possibly getting a voice recognition software.  Follow-up again in 6 to 8 weeks   Updated 03/13/2023 Francelle Shiau is a 57 y.o. female coming in with complaint of neck and hand pain. Hand/finger pain has improved s/p injection. Is able to bending the finger more easily now. Continues to have neck pain, left-sided. No major flare-ups since last visit.       Past Medical History:  Diagnosis Date   Allergy    Hypersomnia with sleep apnea, unspecified 05/23/2013   OSA on CPAP 08/15/2013   Wears glasses    Past Surgical History:  Procedure Laterality Date   COLONOSCOPY WITH PROPOFOL N/A 12/04/2020   Procedure: COLONOSCOPY WITH PROPOFOL;  Surgeon: Jeani Hawking, MD;  Location: [facility];  Service: Endoscopy;  Laterality: N/A;   COLONOSCOPY WITH PROPOFOL N/A 07/22/2022   Procedure: COLONOSCOPY WITH PROPOFOL;  Surgeon: Jeani Hawking, MD;  Location: [facility];  Service: Gastroenterology;  Laterality: N/A;   HYSTEROSCOPY WITH D & C     WISDOM TOOTH EXTRACTION     Social History   Socioeconomic History   Marital status: Single    Spouse name: Not on file   Number of children: 0   Years of education: Post Grad   Highest education level: Not on  file  Occupational History    Comment: Artist  Tobacco Use   Smoking status: Never   Smokeless tobacco: Never  Substance and Sexual Activity   Alcohol use: No   Drug use: No   Sexual activity: Never  Other Topics Concern   Not on file  Social History Narrative   Patient lives at home alone.   Caffeine Use: occasionally soda   Social Determinants of Health   Financial Resource Strain: Not on file  Food Insecurity: Not on file  Transportation Needs: Not on file  Physical Activity: Not on file  Stress: Not on file  Social Connections: Not on file   Allergies  Allergen Reactions   Latex Itching and Rash   Other     Seasonal    Lubricants Itching and Rash   Family History  Problem Relation Age of Onset   Sleep apnea Mother    Diabetes Father        type 2   Depression Father    Dementia Father    Narcolepsy Father        cataplexy   High Cholesterol Father    Stroke Maternal Grandmother    Heart disease Maternal Grandfather       Current Outpatient Medications (Respiratory):    fexofenadine (ALLEGRA) 180 MG tablet, Take 180 mg by mouth daily as needed for allergies.   triamcinolone (NASACORT) 55 MCG/ACT AERO nasal inhaler, Place 2 sprays into the nose 2 (two) times daily as needed (allergies).  Current Outpatient Medications (Hematological):    Cobalamin Combinations (B-12) [phone] MCG SUBL,   Current Outpatient Medications (Other):    amphetamine-dextroamphetamine (ADDERALL) 10 MG tablet, Take 1 tablet (10 mg total) by mouth daily as needed.   b complex vitamins capsule, Take 1 capsule by mouth daily.   Bepotastine Besilate 1.5 % SOLN, Place 1 drop into both eyes daily as needed (allergies).   betamethasone valerate lotion (VALISONE) 0.1 %, Apply 1 application topically daily as needed for irritation (scalp).   Biotin w/ Vitamins C & E (HAIR SKIN & NAILS GUMMIES PO), Take 2 each by mouth daily.   cholecalciferol (VITAMIN D3) 25 MCG (1000 UNIT)  tablet, Take 3,000 Units by mouth daily.   desonide (DESOWEN) 0.05 % lotion, Apply 1 application topically 2 (two) times daily as needed (irritation).   magnesium oxide (MAG-OX) 400 (240 Mg) MG tablet, Take 400 mg by mouth daily.   Multiple Vitamin (MULTIVITAMIN) tablet, Take 1 tablet by mouth daily.   Multiple Vitamin (STRESS FORMULA) TABS, Take 1 each by mouth daily.   Omega-3 Fatty Acids (FISH OIL) 1000 MG CAPS, Take by mouth.   pimecrolimus (ELIDEL) 1 % cream, Apply 1 application topically 2 (two) times daily as needed (irritation).   Tart Cherry 1200 MG CAPS, Take 1,200 mg by mouth daily.   Reviewed prior external information including notes and imaging from  primary care provider As well as notes that were available from care everywhere and other healthcare systems.  Past medical history, social, surgical and family history all reviewed in electronic medical record.  No pertanent information unless stated regarding to the chief complaint.   Review of Systems:  No headache, visual changes, nausea, vomiting, diarrhea, constipation, dizziness, abdominal pain, skin rash, fevers, chills, night sweats, weight loss, swollen lymph nodes,  joint swelling, chest pain, shortness of breath, mood changes. POSITIVE muscle aches, body aches  Objective  Blood pressure 120/82, pulse 64, height 5\' 6"  (1.676 m), weight 213 lb (96.6 kg), last menstrual period 10/05/2017, SpO2 97 %.   General: No apparent distress alert and oriented x3 mood and affect normal, dressed appropriately.  HEENT: Pupils equal, extraocular movements intact  Respiratory: Patient's speak in full sentences and does not appear short of breath  Cardiovascular: No lower extremity edema, non tender, no erythema  Neck exam does have significant loss of lordosis.  Only 5 degrees of extension noted.  Patient does have some limited sidebending bilaterally. Bilateral thumbs do have trigger nodule is noted but no triggering noted at this  time.  Minorly tender to palpation. First MTP does have arthritic changes noted as well with limited range of motion noted.  Limited muscular skeletal ultrasound was performed and interpreted by Antoine Primas, M  First metatarsal joint does have hypoechoic changes noted.  Does have early neuroma noted as well.  Hypoechoic changes noted. Impression: First toe arthritis    Impression and Recommendations:    The above documentation has been reviewed and is accurate and complete Judi Saa, DO

## 2023-03-13 ENCOUNTER — Ambulatory Visit: Payer: 59 | Admitting: Family Medicine

## 2023-03-13 ENCOUNTER — Encounter: Payer: Self-pay | Admitting: Family Medicine

## 2023-03-13 ENCOUNTER — Other Ambulatory Visit: Payer: Self-pay

## 2023-03-13 VITALS — BP 120/82 | HR 64 | Ht 66.0 in | Wt 213.0 lb

## 2023-03-13 DIAGNOSIS — M5030 Other cervical disc degeneration, unspecified cervical region: Secondary | ICD-10-CM | POA: Diagnosis not present

## 2023-03-13 DIAGNOSIS — M19079 Primary osteoarthritis, unspecified ankle and foot: Secondary | ICD-10-CM | POA: Diagnosis not present

## 2023-03-13 DIAGNOSIS — M79642 Pain in left hand: Secondary | ICD-10-CM

## 2023-03-13 DIAGNOSIS — M65312 Trigger thumb, left thumb: Secondary | ICD-10-CM | POA: Diagnosis not present

## 2023-03-13 MED ORDER — GABAPENTIN 300 MG PO CAPS: 300.0000 mg | ORAL_CAPSULE | Freq: Every day | ORAL | 1 refills | Status: DC

## 2023-03-13 NOTE — Patient Instructions (Signed)
Higher in Omega-3 than Omega-6  [employer]idas Boost for dance  Carbon Fiber Plat for shoes  Warm heat and massage for thumb  Follow-up in 3 months

## 2023-03-13 NOTE — Assessment & Plan Note (Signed)
Significant arthritic changes noted.  Discussed with patient at great length.  Discussed home exercises and icing regimen.  Could potentially use another epidural.  Follow-up again in 6 to 8 weeks

## 2023-03-13 NOTE — Assessment & Plan Note (Signed)
Known arthritic changes still noted.  On ultrasound does have some swelling still noted.  Discussed potential injections if needed.  Discussed a carbon fiber plate.  Follow-up again in 6 to 8 weeks

## 2023-03-13 NOTE — Assessment & Plan Note (Signed)
Likely seems to be doing relatively well.  Doing some better but could potentially use another injection if necessary.  Patient at this time would like to continue with the conservative therapy.

## 2023-04-05 DIAGNOSIS — J301 Allergic rhinitis due to pollen: Secondary | ICD-10-CM | POA: Diagnosis not present

## 2023-04-05 DIAGNOSIS — J3081 Allergic rhinitis due to animal (cat) (dog) hair and dander: Secondary | ICD-10-CM | POA: Diagnosis not present

## 2023-04-05 DIAGNOSIS — J3089 Other allergic rhinitis: Secondary | ICD-10-CM | POA: Diagnosis not present

## 2023-04-09 ENCOUNTER — Other Ambulatory Visit: Payer: Self-pay | Admitting: Neurology

## 2023-04-11 MED ORDER — AMPHETAMINE-DEXTROAMPHETAMINE 10 MG PO TABS: 10.0000 mg | ORAL_TABLET | Freq: Every day | ORAL | 0 refills | Status: DC | PRN

## 2023-04-11 NOTE — Telephone Encounter (Signed)
Last seen 01/26/23 and next f/u 02/01/24.  Last refilled rx 01/26/23 #30

## 2023-04-23 ENCOUNTER — Encounter (HOSPITAL_COMMUNITY): Payer: Self-pay

## 2023-04-23 ENCOUNTER — Ambulatory Visit (HOSPITAL_COMMUNITY)
Admission: RE | Admit: 2023-04-23 | Discharge: 2023-04-23 | Disposition: A | Discharge status: Home / Self Care | Payer: 59 | Source: Ambulatory Visit | Attending: Emergency Medicine | Admitting: Emergency Medicine

## 2023-04-23 VITALS — BP 96/70 | HR 87 | Temp 98.1°F | Resp 18

## 2023-04-23 DIAGNOSIS — H60392 Other infective otitis externa, left ear: Secondary | ICD-10-CM

## 2023-04-23 MED ORDER — CIPROFLOXACIN-DEXAMETHASONE 0.3-0.1 % OT SUSP: 4.0000 [drp] | Freq: Two times a day (BID) | OTIC | 0 refills | Status: AC

## 2023-04-23 NOTE — ED Provider Notes (Signed)
[facility]    CSN: [id number] Arrival date & time: 04/23/23  1404     History   Chief Complaint Chief Complaint  Patient presents with   Ear Fullness    May be severe swimmer's ear/ otitis externa. Occurred in both ears. Cleared in the right ear but not the left ear. Swollen, painful ear canal which has not cleared in 5 days. Feels full and lots of pressure. Need to have it looked at  please. - Entered by patient    HPI Alicia [name] is a 57 y.o. female.  Here with bilateral ear discomfort and pain, muffled hearing. Worse in the left. She's a Counselling psychologist. Tried using acetic acid drops which helped right ear some but no improvement on left.   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   Trigger thumb, right thumb 10/15/2020   Right foot pain 10/15/2020   Pes planus 08/30/2018   Trigger point of left shoulder region 03/26/2018   Low back pain 10/19/2017   Carpal tunnel syndrome of left wrist 06/30/2017   Degenerative cervical disc 06/30/2017   Nonallopathic lesion of cervical region 06/30/2017   Nonallopathic lesion of rib cage 06/30/2017   Nonallopathic lesion of thoracic region 06/30/2017   OSA on CPAP 08/15/2013   Hypersomnia with sleep apnea, unspecified 05/23/2013    Past Surgical History:  Procedure Laterality Date   COLONOSCOPY WITH PROPOFOL N/A 12/04/2020   Procedure: COLONOSCOPY WITH PROPOFOL;  Surgeon: Jeani Hawking, MD;  Location: [facility];  Service: Endoscopy;  Laterality: N/A;   COLONOSCOPY WITH PROPOFOL N/A 07/22/2022   Procedure: COLONOSCOPY WITH PROPOFOL;  Surgeon:  Jeani Hawking, MD;  Location: [facility];  Service: Gastroenterology;  Laterality: N/A;   HYSTEROSCOPY WITH D & C     WISDOM TOOTH EXTRACTION      OB History     Gravida  0   Para      Term      Preterm      AB      Living         SAB      IAB      Ectopic      Multiple      Live Births               Home Medications    Prior to Admission medications   Medication Sig Start Date End Date Taking? Authorizing Provider  ciprofloxacin-dexamethasone (CIPRODEX) OTIC suspension Place 4 drops into both ears 2 (two) times daily for 5 days. 04/23/23 04/28/23 Yes Kamayah Pillay, Lurena Joiner, PA-C  amphetamine-dextroamphetamine (ADDERALL) 10 MG tablet Take 1 tablet (10 mg total) by mouth daily as needed. 04/11/23   Huston Foley, MD  b complex vitamins capsule Take 1 capsule by mouth daily.    [provider]  Bepotastine Besilate 1.5 % SOLN Place 1 drop into both eyes daily as needed (allergies).    [provider]  betamethasone valerate lotion (VALISONE) 0.1 % Apply 1 application topically daily as needed for irritation (scalp). 05/31/13   [provider]  Biotin w/ Vitamins C & E (HAIR SKIN & NAILS GUMMIES PO) Take 2 each by mouth daily.    [provider]  cholecalciferol (VITAMIN D3) 25 MCG (1000 UNIT) tablet Take 3,000 Units by mouth daily.    [provider]  Cobalamin Combinations (B-12) [phone] MCG SUBL     [provider]  desonide (DESOWEN) 0.05 % lotion Apply 1 application topically 2 (two) times daily as needed (irritation).    [provider]  fexofenadine (ALLEGRA) 180 MG tablet Take 180 mg by mouth daily as needed for allergies.    [provider]  magnesium oxide (MAG-OX) 400 (240 Mg) MG tablet Take 400 mg by mouth daily.    [provider]  Multiple Vitamin (MULTIVITAMIN) tablet Take 1 tablet by mouth daily.    [provider]  Multiple Vitamin (STRESS FORMULA) TABS Take 1 each by  mouth daily.    [provider]  Omega-3 Fatty Acids (FISH OIL) 1000 MG CAPS Take by mouth.    [provider]  pimecrolimus (ELIDEL) 1 % cream Apply 1 application topically 2 (two) times daily as needed (irritation).    [provider]  Tart Cherry 1200 MG CAPS Take 1,200 mg by mouth daily.    [provider]  triamcinolone (NASACORT) 55 MCG/ACT AERO nasal inhaler Place 2 sprays into the nose 2 (two) times daily as needed (allergies).    [provider]    Family History Family History  Problem Relation Age of Onset   Sleep apnea Mother    Diabetes Father        type 2   Depression Father    Dementia Father    Narcolepsy Father        cataplexy   High Cholesterol Father    Stroke Maternal Grandmother    Heart disease Maternal Grandfather     Social History Social History   Tobacco Use   Smoking status: Never   Smokeless tobacco: Never  Vaping Use   Vaping Use: Never used  Substance Use Topics   Alcohol use: No   Drug use: No     Allergies   Latex, Other, and Lubricants   Review of Systems Review of Systems As per HPI  Physical Exam Triage Vital Signs ED Triage Vitals  Enc Vitals Group     BP 04/23/23 1440 96/70     Pulse Rate 04/23/23 1440 87     Resp 04/23/23 1440 18     Temp 04/23/23 1440 98.1 F (36.7 C)     Temp Source 04/23/23 1440 Oral     SpO2 04/23/23 1440 97 %     Weight --      Height --      Head Circumference --      Peak Flow --      Pain Score 04/23/23 1443 7     Pain Loc --      Pain Edu? --      Excl. in GC? --    No data found.  Updated Vital Signs BP 96/70 (BP Location: Right Arm)   Pulse 87   Temp 98.1 F (36.7 C) (Oral)   Resp 18   LMP 10/05/2017   SpO2 97%    Physical Exam Vitals and nursing note reviewed.  Constitutional:      Appearance: She is not ill-appearing.  HENT:     Left Ear: Drainage and swelling present.     Ears:     Comments:  Left canal is very swollen  and irritated with drainage. Right canal mildly irritated    Nose: No congestion or rhinorrhea.     Mouth/Throat:     Mouth: Mucous membranes are moist.     Pharynx: Oropharynx is clear. No posterior oropharyngeal erythema.  Eyes:     Conjunctiva/sclera: Conjunctivae normal.  Cardiovascular:     Rate and Rhythm: Normal rate and regular rhythm.     Heart sounds: Normal heart sounds.  Pulmonary:     Effort: Pulmonary effort is normal.     Breath sounds: Normal breath sounds.  Musculoskeletal:     Cervical back: Normal range of motion.  Skin:    General: Skin is warm and dry.  Neurological:     Mental Status: She is alert and oriented to person, place, and time.     UC Treatments / Results  Labs (all labs ordered are listed, but only abnormal results are displayed) Labs Reviewed - No data to display  EKG  Radiology No results found.  Procedures Procedures (including critical care time)  Medications Ordered in UC Medications - No data to display  Initial Impression / Assessment and Plan / UC Course  I have reviewed the triage vital signs and the nursing notes.  Pertinent labs & imaging results that were available during my care of the patient were reviewed by me and considered in my medical decision making (see chart for details).  Severe otitis externa on the left with signs of beginning on right. Ciprodex drops BID in both ears for 5 days Advised return if symptoms persisting Provided with ENT clinic info for follow up if needed No questions at this time. Patient agreeable to plan  Final Clinical Impressions(s) / UC Diagnoses   Final diagnoses:  Other infective acute otitis externa of left ear     Discharge Instructions      Ear drops twice daily for 5 days. Use in both ears Please return if symptoms do not improve after the course   Call ENT for follow up appointment if needed    ED Prescriptions     Medication Sig Dispense Auth. Provider    ciprofloxacin-dexamethasone (CIPRODEX) OTIC suspension Place 4 drops into both ears 2 (two) times daily for 5 days. 7.5 mL Blu Mcglaun, Lurena Joiner, PA-C      PDMP not reviewed this encounter.   Marlow Baars, [place] 04/23/23 1610

## 2023-04-23 NOTE — ED Triage Notes (Signed)
Patient reports that she has "swimmer's ear in the let." Patient states she has been using Swimmer's ear OTC and got the right ear resolved.

## 2023-04-23 NOTE — Discharge Instructions (Addendum)
Ear drops twice daily for 5 days. Use in both ears Please return if symptoms do not improve after the course   Call ENT for follow up appointment if needed

## 2023-05-26 DIAGNOSIS — J301 Allergic rhinitis due to pollen: Secondary | ICD-10-CM | POA: Diagnosis not present

## 2023-05-26 DIAGNOSIS — J3081 Allergic rhinitis due to animal (cat) (dog) hair and dander: Secondary | ICD-10-CM | POA: Diagnosis not present

## 2023-05-26 DIAGNOSIS — J3089 Other allergic rhinitis: Secondary | ICD-10-CM | POA: Diagnosis not present

## 2023-06-14 NOTE — Progress Notes (Unsigned)
[name] [address] [place] [address] Phone: [phone] Subjective:   Alicia [name], am serving as a scribe for Dr. Antoine Primas.  I'm seeing this patient by the request  of:  Renaye Rakers, MD  CC: Multiple complaint follow-up  [id number]  03/13/2023 Known arthritic changes still noted.  On ultrasound does have some swelling still noted.  Discussed potential injections if needed.  Discussed a carbon fiber plate.  Follow-up again in 6 to 8 weeks     Significant arthritic changes noted.  Discussed with patient at great length.  Discussed home exercises and icing regimen.  Could potentially use another epidural.  Follow-up again in 6 to 8 weeks     Likely seems to be doing relatively well.  Doing some better but could potentially use another injection if necessary.  Patient at this time would like to continue with the conservative therapy.     Updated 06/15/2023 Alicia [name] is a 57 y.o. female coming in with complaint of L hand and neck pain. Patient states that she has been doing a lot of stretching for the neck and swimming. Pain is manageable.   L thumb is triggering but she would like to try a warm compress instead of injection today.   L foot is doing ok. Supportive shoes have been helpful. She is not 100% but is improving.       Past Medical History:  Diagnosis Date   Allergy    Hypersomnia with sleep apnea, unspecified 05/23/2013   OSA on CPAP 08/15/2013   Wears glasses    Past Surgical History:  Procedure Laterality Date   COLONOSCOPY WITH PROPOFOL N/A 12/04/2020   Procedure: COLONOSCOPY WITH PROPOFOL;  Surgeon: Jeani Hawking, MD;  Location: [facility];  Service: Endoscopy;  Laterality: N/A;   COLONOSCOPY WITH PROPOFOL N/A 07/22/2022   Procedure: COLONOSCOPY WITH PROPOFOL;  Surgeon: Jeani Hawking, MD;  Location: [facility];  Service: Gastroenterology;  Laterality: N/A;   HYSTEROSCOPY WITH D & C     WISDOM  TOOTH EXTRACTION     Social History   Socioeconomic History   Marital status: Single    Spouse name: Not on file   Number of children: 0   Years of education: Post Grad   Highest education level: Not on file  Occupational History    Comment: Artist  Tobacco Use   Smoking status: Never   Smokeless tobacco: Never  Vaping Use   Vaping status: Never Used  Substance and Sexual Activity   Alcohol use: No   Drug use: No   Sexual activity: Never  Other Topics Concern   Not on file  Social History Narrative   Patient lives at home alone.   Caffeine Use: occasionally soda   Social Determinants of Health   Financial Resource Strain: Not on file  Food Insecurity: Not on file  Transportation Needs: Not on file  Physical Activity: Not on file  Stress: Not on file  Social Connections: Not on file   Allergies  Allergen Reactions   Latex Itching and Rash   Other     Seasonal    Lubricants Itching and Rash   Family History  Problem Relation Age of Onset   Sleep apnea Mother    Diabetes Father        type 2   Depression Father    Dementia Father    Narcolepsy Father        cataplexy   High Cholesterol  Father    Stroke Maternal Grandmother    Heart disease Maternal Grandfather       Current Outpatient Medications (Respiratory):    fexofenadine (ALLEGRA) 180 MG tablet, Take 180 mg by mouth daily as needed for allergies.   triamcinolone (NASACORT) 55 MCG/ACT AERO nasal inhaler, Place 2 sprays into the nose 2 (two) times daily as needed (allergies).   Current Outpatient Medications (Hematological):    Cobalamin Combinations (B-12) [phone] MCG SUBL,   Current Outpatient Medications (Other):    amphetamine-dextroamphetamine (ADDERALL) 10 MG tablet, Take 1 tablet (10 mg total) by mouth daily as needed.   b complex vitamins capsule, Take 1 capsule by mouth daily.   Bepotastine Besilate 1.5 % SOLN, Place 1 drop into both eyes daily as needed (allergies).    betamethasone valerate lotion (VALISONE) 0.1 %, Apply 1 application topically daily as needed for irritation (scalp).   Biotin w/ Vitamins C & E (HAIR SKIN & NAILS GUMMIES PO), Take 2 each by mouth daily.   cholecalciferol (VITAMIN D3) 25 MCG (1000 UNIT) tablet, Take 3,000 Units by mouth daily.   desonide (DESOWEN) 0.05 % lotion, Apply 1 application topically 2 (two) times daily as needed (irritation).   magnesium oxide (MAG-OX) 400 (240 Mg) MG tablet, Take 400 mg by mouth daily.   Multiple Vitamin (MULTIVITAMIN) tablet, Take 1 tablet by mouth daily.   Multiple Vitamin (STRESS FORMULA) TABS, Take 1 each by mouth daily.   Omega-3 Fatty Acids (FISH OIL) 1000 MG CAPS, Take by mouth.   pimecrolimus (ELIDEL) 1 % cream, Apply 1 application topically 2 (two) times daily as needed (irritation).   Tart Cherry 1200 MG CAPS, Take 1,200 mg by mouth daily.   Reviewed prior external information including notes and imaging from  primary care provider As well as notes that were available from care everywhere and other healthcare systems.  Past medical history, social, surgical and family history all reviewed in electronic medical record.  No pertanent information unless stated regarding to the chief complaint.   Review of Systems:  No headache, visual changes, nausea, vomiting, diarrhea, constipation, dizziness, abdominal pain, skin rash, fevers, chills, night sweats, weight loss, swollen lymph nodes, body aches, joint swelling, chest pain, shortness of breath, mood changes. POSITIVE muscle aches  Objective  Blood pressure 128/78, pulse 71, height 5\' 6"  (1.676 m), weight 217 lb (98.4 kg), last menstrual period 10/05/2017, SpO2 98%.   General: No apparent distress alert and oriented x3 mood and affect normal, dressed appropriately.  HEENT: Pupils equal, extraocular movements intact  Respiratory: Patient's speak in full sentences and does not appear short of breath  Cardiovascular: No lower extremity edema,  non tender, no erythema  Patient has a very mild antalgic gait noted.  Left foot does have some triggering noted of the thumb noted.  Nodule noted at the A2 pulley. Neck exam does have some loss of lordosis.  Still has worsening pain with extension of the neck especially with lacking the last 10 degrees of rotation on the left.  Limited muscular skeletal ultrasound was performed and interpreted by Antoine Primas, M  Limited ultrasound shows the patient does have nodule noted within the tendon sheath of the flexor tendon of the left hand.  No cortical irregularity noted. Impression: Trigger nodule noted   After reviewing patient's chart and discussing with patient for 32 minutes today. Impression and Recommendations:      The above documentation has been reviewed and is accurate and complete Judi Saa, DO

## 2023-06-15 ENCOUNTER — Other Ambulatory Visit: Payer: Self-pay

## 2023-06-15 ENCOUNTER — Ambulatory Visit: Payer: 59 | Admitting: Family Medicine

## 2023-06-15 ENCOUNTER — Encounter: Payer: Self-pay | Admitting: Family Medicine

## 2023-06-15 VITALS — BP 128/78 | HR 71 | Ht 66.0 in | Wt 217.0 lb

## 2023-06-15 DIAGNOSIS — M2141 Flat foot [pes planus] (acquired), right foot: Secondary | ICD-10-CM | POA: Diagnosis not present

## 2023-06-15 DIAGNOSIS — J301 Allergic rhinitis due to pollen: Secondary | ICD-10-CM | POA: Diagnosis not present

## 2023-06-15 DIAGNOSIS — M65312 Trigger thumb, left thumb: Secondary | ICD-10-CM

## 2023-06-15 DIAGNOSIS — J3081 Allergic rhinitis due to animal (cat) (dog) hair and dander: Secondary | ICD-10-CM | POA: Diagnosis not present

## 2023-06-15 DIAGNOSIS — G8929 Other chronic pain: Secondary | ICD-10-CM | POA: Diagnosis not present

## 2023-06-15 DIAGNOSIS — M19079 Primary osteoarthritis, unspecified ankle and foot: Secondary | ICD-10-CM

## 2023-06-15 DIAGNOSIS — M2142 Flat foot [pes planus] (acquired), left foot: Secondary | ICD-10-CM

## 2023-06-15 DIAGNOSIS — M79645 Pain in left finger(s): Secondary | ICD-10-CM | POA: Diagnosis not present

## 2023-06-15 DIAGNOSIS — J3089 Other allergic rhinitis: Secondary | ICD-10-CM | POA: Diagnosis not present

## 2023-06-15 NOTE — Assessment & Plan Note (Signed)
Patient has had pes planus for a long time same thing we discussed the good shoes and rocker-bottom follow-up again in 3 months

## 2023-06-15 NOTE — Patient Instructions (Signed)
Keep massing thumb Hypervolt or theragun for neck See me in 2 months

## 2023-06-15 NOTE — Assessment & Plan Note (Signed)
still stable while we discussed with patient.  Discussed icing regimen and home exercises.  Discussed which activities to do and which ones to avoid.  Patient is wearing relatively good shoes today.  Discussed with her to continue the rocker-bottom shoes.  Worsening pain can consider injections.  Follow-up again in 3 months

## 2023-06-15 NOTE — Assessment & Plan Note (Signed)
Patient has not had another injection in quite some time.  Can repeat it if necessary.  Patient wants to hold off at this time.  Increase activity slowly otherwise.

## 2023-06-23 DIAGNOSIS — J301 Allergic rhinitis due to pollen: Secondary | ICD-10-CM | POA: Diagnosis not present

## 2023-06-23 DIAGNOSIS — J3089 Other allergic rhinitis: Secondary | ICD-10-CM | POA: Diagnosis not present

## 2023-06-23 DIAGNOSIS — J3081 Allergic rhinitis due to animal (cat) (dog) hair and dander: Secondary | ICD-10-CM | POA: Diagnosis not present

## 2023-07-05 ENCOUNTER — Other Ambulatory Visit: Payer: Self-pay | Admitting: Neurology

## 2023-07-06 MED ORDER — AMPHETAMINE-DEXTROAMPHETAMINE 10 MG PO TABS: 10.0000 mg | ORAL_TABLET | Freq: Every day | ORAL | 0 refills | Status: DC | PRN

## 2023-07-06 NOTE — Telephone Encounter (Signed)
Last seen on 01/26/23 Follow up scheduled on 02/01/24 Last filled on 03/25/23 #30 tablets (30 day supply) Rx pending to be signed

## 2023-07-17 DIAGNOSIS — G4733 Obstructive sleep apnea (adult) (pediatric): Secondary | ICD-10-CM | POA: Diagnosis not present

## 2023-07-27 ENCOUNTER — Inpatient Hospital Stay: Payer: 59 | Admitting: Hematology and Oncology

## 2023-08-09 ENCOUNTER — Other Ambulatory Visit: Payer: Self-pay | Admitting: Neurology

## 2023-08-10 ENCOUNTER — Other Ambulatory Visit: Payer: Self-pay

## 2023-08-10 MED ORDER — AMPHETAMINE-DEXTROAMPHETAMINE 10 MG PO TABS: 10.0000 mg | ORAL_TABLET | Freq: Every day | ORAL | 0 refills | Status: DC | PRN

## 2023-08-10 NOTE — Telephone Encounter (Signed)
Pt Last Seen 01/26/2023 Upcoming Appointment 02/01/2024  Adderall last filled 07/06/2023

## 2023-08-14 DIAGNOSIS — R21 Rash and other nonspecific skin eruption: Secondary | ICD-10-CM | POA: Diagnosis not present

## 2023-08-14 DIAGNOSIS — J3089 Other allergic rhinitis: Secondary | ICD-10-CM | POA: Diagnosis not present

## 2023-08-14 DIAGNOSIS — J301 Allergic rhinitis due to pollen: Secondary | ICD-10-CM | POA: Diagnosis not present

## 2023-08-14 DIAGNOSIS — J3081 Allergic rhinitis due to animal (cat) (dog) hair and dander: Secondary | ICD-10-CM | POA: Diagnosis not present

## 2023-08-16 NOTE — Progress Notes (Unsigned)
[name] [address] [place] [address] Phone: [phone] Subjective:   Alicia [name], am serving as a scribe for Dr. Antoine Primas.  I'm seeing this patient by the request  of:  Renaye Rakers, MD  CC: Hand pain, foot pain  [id number]  06/15/2023 Patient has not had another injection in quite some time.  Can repeat it if necessary.  Patient wants to hold off at this time.  Increase activity slowly otherwise.     Patient has had pes planus for a long time same thing we discussed the good shoes and rocker-bottom follow-up again in 3 months     still stable while we discussed with patient.  Discussed icing regimen and home exercises.  Discussed which activities to do and which ones to avoid.  Patient is wearing relatively good shoes today.  Discussed with her to continue the rocker-bottom shoes.  Worsening pain can consider injections.  Follow-up again in 3 months   Updated 08/17/2023 Alicia [name] is a 57 y.o. female coming in with complaint of B foot and L thumb pain. Patient states that her thumb worsens with typing. Pain is now constant. Using warm compress. Wears wrist brace on L thumb.   R great toe pain worsens when she wears certain shoes. Pain is ok today.       Past Medical History:  Diagnosis Date   Allergy    Hypersomnia with sleep apnea, unspecified 05/23/2013   OSA on CPAP 08/15/2013   Wears glasses    Past Surgical History:  Procedure Laterality Date   COLONOSCOPY WITH PROPOFOL N/A 12/04/2020   Procedure: COLONOSCOPY WITH PROPOFOL;  Surgeon: Jeani Hawking, MD;  Location: [facility];  Service: Endoscopy;  Laterality: N/A;   COLONOSCOPY WITH PROPOFOL N/A 07/22/2022   Procedure: COLONOSCOPY WITH PROPOFOL;  Surgeon: Jeani Hawking, MD;  Location: [facility];  Service: Gastroenterology;  Laterality: N/A;   HYSTEROSCOPY WITH D & C     WISDOM TOOTH EXTRACTION     Social History   Socioeconomic History   Marital  status: Single    Spouse name: Not on file   Number of children: 0   Years of education: Post Grad   Highest education level: Not on file  Occupational History    Comment: Artist  Tobacco Use   Smoking status: Never   Smokeless tobacco: Never  Vaping Use   Vaping status: Never Used  Substance and Sexual Activity   Alcohol use: No   Drug use: No   Sexual activity: Never  Other Topics Concern   Not on file  Social History Narrative   Patient lives at home alone.   Caffeine Use: occasionally soda   Social Determinants of Health   Financial Resource Strain: Not on file  Food Insecurity: Not on file  Transportation Needs: Not on file  Physical Activity: Not on file  Stress: Not on file  Social Connections: Not on file   Allergies  Allergen Reactions   Latex Itching and Rash   Other     Seasonal    Lubricants Itching and Rash   Family History  Problem Relation Age of Onset   Sleep apnea Mother    Diabetes Father        type 2   Depression Father    Dementia Father    Narcolepsy Father        cataplexy   High Cholesterol Father    Stroke Maternal Grandmother    Heart  disease Maternal Grandfather       Current Outpatient Medications (Respiratory):    fexofenadine (ALLEGRA) 180 MG tablet, Take 180 mg by mouth daily as needed for allergies.   triamcinolone (NASACORT) 55 MCG/ACT AERO nasal inhaler, Place 2 sprays into the nose 2 (two) times daily as needed (allergies).   Current Outpatient Medications (Hematological):    Cobalamin Combinations (B-12) [phone] MCG SUBL,   Current Outpatient Medications (Other):    amphetamine-dextroamphetamine (ADDERALL) 10 MG tablet, Take 1 tablet (10 mg total) by mouth daily as needed.   b complex vitamins capsule, Take 1 capsule by mouth daily.   Bepotastine Besilate 1.5 % SOLN, Place 1 drop into both eyes daily as needed (allergies).   betamethasone valerate lotion (VALISONE) 0.1 %, Apply 1 application topically  daily as needed for irritation (scalp).   Biotin w/ Vitamins C & E (HAIR SKIN & NAILS GUMMIES PO), Take 2 each by mouth daily.   cholecalciferol (VITAMIN D3) 25 MCG (1000 UNIT) tablet, Take 3,000 Units by mouth daily.   desonide (DESOWEN) 0.05 % lotion, Apply 1 application topically 2 (two) times daily as needed (irritation).   magnesium oxide (MAG-OX) 400 (240 Mg) MG tablet, Take 400 mg by mouth daily.   Multiple Vitamin (MULTIVITAMIN) tablet, Take 1 tablet by mouth daily.   Multiple Vitamin (STRESS FORMULA) TABS, Take 1 each by mouth daily.   Omega-3 Fatty Acids (FISH OIL) 1000 MG CAPS, Take by mouth.   pimecrolimus (ELIDEL) 1 % cream, Apply 1 application topically 2 (two) times daily as needed (irritation).   Tart Cherry 1200 MG CAPS, Take 1,200 mg by mouth daily.   Vitamin D, Ergocalciferol, (DRISDOL) 1.25 MG (50000 UNIT) CAPS capsule, Take 1 capsule (50,000 Units total) by mouth every 7 (seven) days.   Reviewed prior external information including notes and imaging from  primary care provider As well as notes that were available from care everywhere and other healthcare systems.  Past medical history, social, surgical and family history all reviewed in electronic medical record.  No pertanent information unless stated regarding to the chief complaint.   Review of Systems:  No headache, visual changes, nausea, vomiting, diarrhea, constipation, dizziness, abdominal pain, skin rash, fevers, chills, night sweats, weight loss, swollen lymph nodes, body aches, joint swelling, chest pain, shortness of breath, mood changes. POSITIVE muscle aches  Objective  Blood pressure 110/74, pulse 66, height 5\' 6"  (1.676 m), weight 217 lb (98.4 kg), last menstrual period 10/05/2017, SpO2 96%.   General: No apparent distress alert and oriented x3 mood and affect normal, dressed appropriately.  HEENT: Pupils equal, extraocular movements intact  Respiratory: Patient's speak in full sentences and does not  appear short of breath  Cardiovascular: No lower extremity edema, non tender, no erythema  Foot exam still shows the breakdown of the transverse arch and pes planus noted.  Patient does have tenderness to palpation more over the first toe.  Hallux rigidus noted on the right side.  Patient does have a trace effusion noted also of the first, second and third fingers of the hands bilaterally with mild erythema noted.  No sign of any infectious etiology though.  Left thumb does have a trigger nodule noted that is calcified at the A2 pulley but still freely movable.  Tender to palpation.   Limited muscular skeletal ultrasound was performed and interpreted by Antoine Primas, M   Limited ultrasound shows that there is some calcific changes of the joint spaces of the fingers as well as the  first metatarsal which does have some progression of the osteoarthritic changes. 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

## 2023-08-17 ENCOUNTER — Ambulatory Visit: Payer: 59 | Admitting: Family Medicine

## 2023-08-17 ENCOUNTER — Other Ambulatory Visit: Payer: Self-pay

## 2023-08-17 ENCOUNTER — Encounter: Payer: Self-pay | Admitting: Family Medicine

## 2023-08-17 VITALS — BP 110/74 | HR 66 | Ht 66.0 in | Wt 217.0 lb

## 2023-08-17 DIAGNOSIS — M19079 Primary osteoarthritis, unspecified ankle and foot: Secondary | ICD-10-CM

## 2023-08-17 DIAGNOSIS — M65312 Trigger thumb, left thumb: Secondary | ICD-10-CM

## 2023-08-17 DIAGNOSIS — M79645 Pain in left finger(s): Secondary | ICD-10-CM | POA: Diagnosis not present

## 2023-08-17 DIAGNOSIS — G8929 Other chronic pain: Secondary | ICD-10-CM

## 2023-08-17 MED ORDER — VITAMIN D (ERGOCALCIFEROL) 1.25 MG (50000 UNIT) PO CAPS: 50000.0000 [IU] | ORAL_CAPSULE | ORAL | 0 refills | Status: DC

## 2023-08-17 NOTE — Patient Instructions (Signed)
Once weekly Vit D for 4 weeks Tart cherry 2400mg  at night May need to consider labs See me in 3 months

## 2023-08-17 NOTE — Assessment & Plan Note (Signed)
Patient does have a trigger finger noted of the left thumb.  Does seem to have calcified.  Still declines any injection at the moment.  Did discuss again the workup for potential hypercalcemia if needed.  Will pass note also on to primary care.

## 2023-08-17 NOTE — Assessment & Plan Note (Signed)
Patient does have the arthritis noted but does have some calcific changes noted on ultrasound today.  Do believe that the calcium is giving him trouble.  We did discuss potential laboratory workup which patient did not want to do at this moment.  Will start with her tart cherry.  If worsening symptoms would do a calcium workup, vitamin D as well as uric acid.  Patient will follow-up with me again 6 to 8 weeks

## 2023-09-01 ENCOUNTER — Inpatient Hospital Stay: Payer: 59 | Attending: Hematology and Oncology | Admitting: Hematology and Oncology

## 2023-09-01 VITALS — BP 134/72 | HR 72 | Temp 97.5°F | Resp 18 | Ht 66.0 in | Wt 217.4 lb

## 2023-09-01 DIAGNOSIS — Z9189 Other specified personal risk factors, not elsewhere classified: Secondary | ICD-10-CM

## 2023-09-01 DIAGNOSIS — R232 Flushing: Secondary | ICD-10-CM | POA: Insufficient documentation

## 2023-09-01 DIAGNOSIS — Z803 Family history of malignant neoplasm of breast: Secondary | ICD-10-CM | POA: Diagnosis not present

## 2023-09-01 DIAGNOSIS — N951 Menopausal and female climacteric states: Secondary | ICD-10-CM | POA: Insufficient documentation

## 2023-09-01 DIAGNOSIS — R9230 Dense breasts, unspecified: Secondary | ICD-10-CM | POA: Insufficient documentation

## 2023-09-01 NOTE — Progress Notes (Signed)
Patient Care Team: Renaye Rakers, MD as PCP - General (Family Medicine) Magrinat, Valentino Hue, MD (Inactive) as Consulting Physician (Oncology) Jaymes Graff, MD as Consulting Physician (Obstetrics and Gynecology) Jeani Hawking, MD as Consulting Physician (Gastroenterology)  DIAGNOSIS:  Encounter Diagnosis  Name Primary?   At high risk for breast cancer Yes      CHIEF COMPLIANT: Follow-up being high risk for breast cancer  HISTORY OF PRESENT ILLNESS:   History of Present Illness   The patient, with a history of menopause and a family history of breast cancer, presents for an annual check-up. She reports no issues and has been maintaining an active lifestyle by swimming at a local gym. She has been experiencing hot flashes for almost five years, which she manages with natural supplements like Estroven and black cohosh. She also tried a cooling device but found it uncomfortable. She expresses hope that she is nearing the end of this menopausal symptom. She also mentions a past history of low back pain due to a ruptured disc at L4, L5, but currently, she is doing well with no pain. She had a mammogram in February, which was normal. She has a sister who was diagnosed with breast cancer three years ago and is currently on chemotherapy.         ALLERGIES:  is allergic to latex, other, and lubricants.  MEDICATIONS:  Current Outpatient Medications  Medication Sig Dispense Refill   amphetamine-dextroamphetamine (ADDERALL) 10 MG tablet Take 1 tablet (10 mg total) by mouth daily as needed. 30 tablet 0   b complex vitamins capsule Take 1 capsule by mouth daily.     Bepotastine Besilate 1.5 % SOLN Place 1 drop into both eyes daily as needed (allergies).     betamethasone valerate lotion (VALISONE) 0.1 % Apply 1 application topically daily as needed for irritation (scalp).     Biotin w/ Vitamins C & E (HAIR SKIN & NAILS GUMMIES PO) Take 2 each by mouth daily.     cholecalciferol (VITAMIN D3) 25  MCG (1000 UNIT) tablet Take 3,000 Units by mouth daily.     Cobalamin Combinations (B-12) [phone] MCG SUBL      desonide (DESOWEN) 0.05 % lotion Apply 1 application topically 2 (two) times daily as needed (irritation).     fexofenadine (ALLEGRA) 180 MG tablet Take 180 mg by mouth daily as needed for allergies.     magnesium oxide (MAG-OX) 400 (240 Mg) MG tablet Take 400 mg by mouth daily.     Multiple Vitamin (MULTIVITAMIN) tablet Take 1 tablet by mouth daily.     Multiple Vitamin (STRESS FORMULA) TABS Take 1 each by mouth daily.     Omega-3 Fatty Acids (FISH OIL) 1000 MG CAPS Take by mouth.     pimecrolimus (ELIDEL) 1 % cream Apply 1 application topically 2 (two) times daily as needed (irritation).     Tart Cherry 1200 MG CAPS Take 1,200 mg by mouth daily.     triamcinolone (NASACORT) 55 MCG/ACT AERO nasal inhaler Place 2 sprays into the nose 2 (two) times daily as needed (allergies).     Vitamin D, Ergocalciferol, (DRISDOL) 1.25 MG (50000 UNIT) CAPS capsule Take 1 capsule (50,000 Units total) by mouth every 7 (seven) days. 12 capsule 0   No current facility-administered medications for this visit.    PHYSICAL EXAMINATION: ECOG PERFORMANCE STATUS: 1 - Symptomatic but completely ambulatory  Vitals:   09/01/23 1135  BP: 134/72  Pulse: 72  Resp: 18  Temp: (!) 97.5 F (  36.4 C)  SpO2: 100%   Filed Weights   09/01/23 1135  Weight: 217 lb 6.4 oz (98.6 kg)    Physical Exam no palpable lumps or nodules in the breast        (exam performed in the presence of a chaperone)  LABORATORY DATA:  I have reviewed the data as listed    Latest Ref Rng & Units 01/04/2021    3:50 PM  CMP  Glucose 70 - 99 mg/dL 92   BUN 6 - 20 mg/dL 14   Creatinine 4.09 - 1.00 mg/dL 8.11   Sodium 914 - 782 mmol/L 139   Potassium 3.5 - 5.1 mmol/L 4.1   Chloride 98 - 111 mmol/L 106   CO2 22 - 32 mmol/L 26   Calcium 8.9 - 10.3 mg/dL 8.9   Total Protein 6.5 - 8.1 g/dL 7.5   Total Bilirubin 0.3 - 1.2 mg/dL  0.9   Alkaline Phos 38 - 126 U/L 65   AST 15 - 41 U/L 21   ALT 0 - 44 U/L 18     Lab Results  Component Value Date   WBC 5.7 01/04/2021   HGB 13.2 01/04/2021   HCT 40.3 01/04/2021   MCV 98.3 01/04/2021   PLT 341 01/04/2021   NEUTROABS 3.3 01/04/2021    ASSESSMENT & PLAN:  At high risk for breast cancer Dr. Darrall Dears patient  Lifetime risk of breast cancer: 29% Patient decided not to take any antiestrogen therapy   Breast cancer surveillance: 1.  Breast exam 09/01/2023: Benign 2. mammogram 12/05/2022 at [facility]: Benign 3.  Breast MRI 06/14/2022: Benign breast density category B We discussed about obtaining a breast MRI every other year     Return to clinic in 1 year for follow-up    Menopausal Symptoms Reports hot flashes and sweating, particularly at night. Has tried natural supplements and cooling devices with limited success. -Encourage continued exercise as a natural method to manage symptoms.  General Health Maintenance -Continue regular exercise regimen, including swimming. -Annual follow-up after MRI in 2025.          Orders Placed This Encounter  Procedures   MR BREAST BILATERAL W WO CONTRAST INC CAD    Standing Status:   Future    Standing Expiration Date:   08/31/2024    Order Specific Question:   If indicated for the ordered procedure, I authorize the administration of contrast media per Radiology protocol    Answer:   Yes    Order Specific Question:   What is the patient's sedation requirement?    Answer:   No Sedation    Order Specific Question:   Does the patient have a pacemaker or implanted devices?    Answer:   No    Order Specific Question:   Preferred imaging location?    Answer:   [facility] (table limit-550lbs)    Order Specific Question:   Release to patient    Answer:   Immediate   The patient has a good understanding of the overall plan. she agrees with it. she will call with any problems that may develop before the next visit  here. Total time spent: 30 mins including face to face time and time spent for planning, charting and co-ordination of care   Tamsen Meek, MD 09/01/23

## 2023-09-01 NOTE — Assessment & Plan Note (Addendum)
Dr. Darrall Dears patient  Lifetime risk of breast cancer: 29% Patient decided not to take any antiestrogen therapy   Breast cancer surveillance: 1.  Breast exam 09/01/2023: Benign 2. mammogram 12/05/2022 at [facility]: Benign 3.  Breast MRI 06/14/2022: Benign breast density category B We discussed about obtaining a breast MRI every other year     Return to clinic in 1 year for follow-up

## 2023-09-19 NOTE — Telephone Encounter (Signed)
Pt Last Seen 01/26/2023 Upcoming Appointment 02/01/2024   Adderall last filled 08/10/2023

## 2023-10-16 DIAGNOSIS — G4733 Obstructive sleep apnea (adult) (pediatric): Secondary | ICD-10-CM | POA: Diagnosis not present

## 2023-11-15 NOTE — Progress Notes (Signed)
[name] [address] [place] [address] Phone: [phone] Subjective:   Bruce Donath, am serving as a scribe for Dr. Antoine Primas.  I'm seeing this patient by the request  of:  Renaye Rakers, MD  CC: Neck and back pain hand pain  ION:[id number]  08/17/2023 Patient does have a trigger finger noted of the left thumb.  Does seem to have calcified.  Still declines any injection at the moment.  Did discuss again the workup for potential hypercalcemia if needed.  Will pass note also on to primary care.     Patient does have the arthritis noted but does have some calcific changes noted on ultrasound today.  Do believe that the calcium is giving him trouble.  We did discuss potential laboratory workup which patient did not want to do at this moment.  Will start with her tart cherry.  If worsening symptoms would do a calcium workup, vitamin D as well as uric acid.  Patient will follow-up with me again 6 to 8 weeks     Updated 11/17/2023 Alicia [name] is a 58 y.o. female coming in with complaint of finger pain. Patient states that she is looking for voice recognition software. Pain in thumb is the same as it has been. Swelling occurs near the end of the day on busy days. Wears a wrist brace at night that splints thumb in extended position. Notices a crunching in all of the joints in her fingers. Using glucosamine/chondroitin supplement daily.   Neck has been doing ok. Able to perform rotation with less pain.  Overall but if she does the exercises she seems better       Past Medical History:  Diagnosis Date   Allergy    Hypersomnia with sleep apnea, unspecified 05/23/2013   OSA on CPAP 08/15/2013   Wears glasses    Past Surgical History:  Procedure Laterality Date   COLONOSCOPY WITH PROPOFOL N/A 12/04/2020   Procedure: COLONOSCOPY WITH PROPOFOL;  Surgeon: Jeani Hawking, MD;  Location: [facility];  Service: Endoscopy;  Laterality: N/A;    COLONOSCOPY WITH PROPOFOL N/A 07/22/2022   Procedure: COLONOSCOPY WITH PROPOFOL;  Surgeon: Jeani Hawking, MD;  Location: [facility];  Service: Gastroenterology;  Laterality: N/A;   HYSTEROSCOPY WITH D & C     WISDOM TOOTH EXTRACTION     Social History   Socioeconomic History   Marital status: Single    Spouse name: Not on file   Number of children: 0   Years of education: Post Grad   Highest education level: Not on file  Occupational History    Comment: Artist  Tobacco Use   Smoking status: Never   Smokeless tobacco: Never  Vaping Use   Vaping status: Never Used  Substance and Sexual Activity   Alcohol use: No   Drug use: No   Sexual activity: Never  Other Topics Concern   Not on file  Social History Narrative   Patient lives at home alone.   Caffeine Use: occasionally soda   Social Drivers of Corporate investment banker Strain: Not on file  Food Insecurity: Not on file  Transportation Needs: Not on file  Physical Activity: Not on file  Stress: Not on file  Social Connections: Not on file   Allergies  Allergen Reactions   Latex Itching and Rash   Other     Seasonal    Lubricants Itching and Rash   Family History  Problem Relation Age of  Onset   Sleep apnea Mother    Diabetes Father        type 2   Depression Father    Dementia Father    Narcolepsy Father        cataplexy   High Cholesterol Father    Stroke Maternal Grandmother    Heart disease Maternal Grandfather       Current Outpatient Medications (Respiratory):    fexofenadine (ALLEGRA) 180 MG tablet, Take 180 mg by mouth daily as needed for allergies.   triamcinolone (NASACORT) 55 MCG/ACT AERO nasal inhaler, Place 2 sprays into the nose 2 (two) times daily as needed (allergies).   Current Outpatient Medications (Hematological):    Cobalamin Combinations (B-12) [phone] MCG SUBL,   Current Outpatient Medications (Other):    amphetamine-dextroamphetamine (ADDERALL) 10 MG tablet,  Take 1 tablet (10 mg total) by mouth daily as needed.   b complex vitamins capsule, Take 1 capsule by mouth daily.   Bepotastine Besilate 1.5 % SOLN, Place 1 drop into both eyes daily as needed (allergies).   betamethasone valerate lotion (VALISONE) 0.1 %, Apply 1 application topically daily as needed for irritation (scalp).   Biotin w/ Vitamins C & E (HAIR SKIN & NAILS GUMMIES PO), Take 2 each by mouth daily.   cholecalciferol (VITAMIN D3) 25 MCG (1000 UNIT) tablet, Take 3,000 Units by mouth daily.   desonide (DESOWEN) 0.05 % lotion, Apply 1 application topically 2 (two) times daily as needed (irritation).   magnesium oxide (MAG-OX) 400 (240 Mg) MG tablet, Take 400 mg by mouth daily.   Multiple Vitamin (MULTIVITAMIN) tablet, Take 1 tablet by mouth daily.   Multiple Vitamin (STRESS FORMULA) TABS, Take 1 each by mouth daily.   Omega-3 Fatty Acids (FISH OIL) 1000 MG CAPS, Take by mouth.   pimecrolimus (ELIDEL) 1 % cream, Apply 1 application topically 2 (two) times daily as needed (irritation).   Tart Cherry 1200 MG CAPS, Take 1,200 mg by mouth daily.   Vitamin D, Ergocalciferol, (DRISDOL) 1.25 MG (50000 UNIT) CAPS capsule, Take 1 capsule (50,000 Units total) by mouth every 7 (seven) days.   Reviewed prior external information including notes and imaging from  primary care provider As well as notes that were available from care everywhere and other healthcare systems.  Past medical history, social, surgical and family history all reviewed in electronic medical record.  No pertanent information unless stated regarding to the chief complaint.   Review of Systems:  No headache, visual changes, nausea, vomiting, diarrhea, constipation, dizziness, abdominal pain, skin rash, fevers, chills, night sweats, weight loss, swollen lymph nodes, body aches, joint swelling, chest pain, shortness of breath, mood changes. POSITIVE muscle aches  Objective  Blood pressure 118/78, pulse 77, height 5\' 6"  (1.676  m), weight 223 lb (101.2 kg), last menstrual period 10/05/2017, SpO2 97%.   General: No apparent distress alert and oriented x3 mood and affect normal, dressed appropriately.  HEENT: Pupils equal, extraocular movements intact  Respiratory: Patient's speak in full sentences and does not appear short of breath  Cardiovascular: No lower extremity edema, non tender, no erythema  Low back does have some loss of lordosis.  Neck exam does have some limited range of motion.  Feet seem okay but still has pain in the first MTP.  Left thumb does have a trigger nodule noted.   Procedure: Real-time Ultrasound Guided Injection of flexor tendon sheath Device: GE Logiq Q7 Ultrasound guided injection is preferred based studies that show increased duration, increased effect, greater accuracy,  decreased procedural pain, increased response rate, and decreased cost with ultrasound guided versus blind injection.  Verbal informed consent obtained.  Time-out conducted.  Noted no overlying erythema, induration, or other signs of local infection.  Skin prepped in a sterile fashion.  Local anesthesia: Topical Ethyl chloride.  With sterile technique and under real time ultrasound guidance: With a 25-gauge half inch needle injected with 0.5 cc of 0.5% Marcaine and 0.5 cc of Kenalog 40 mg/mL Completed without difficulty  Pain immediately resolved suggesting accurate placement of the medication.  Advised to call if fevers/chills, erythema, induration, drainage, or persistent bleeding.  Imaging saved and stored for review Impression: Technically successful ultrasound guided injection.   Impression and Recommendations:    The above documentation has been reviewed and is accurate and complete Judi Saa, DO

## 2023-11-17 ENCOUNTER — Ambulatory Visit: Payer: Commercial Managed Care - HMO | Admitting: Family Medicine

## 2023-11-17 ENCOUNTER — Encounter: Payer: Self-pay | Admitting: Family Medicine

## 2023-11-17 ENCOUNTER — Other Ambulatory Visit: Payer: Self-pay

## 2023-11-17 VITALS — BP 118/78 | HR 77 | Ht 66.0 in | Wt 223.0 lb

## 2023-11-17 DIAGNOSIS — G8929 Other chronic pain: Secondary | ICD-10-CM

## 2023-11-17 DIAGNOSIS — M79645 Pain in left finger(s): Secondary | ICD-10-CM

## 2023-11-17 DIAGNOSIS — M65312 Trigger thumb, left thumb: Secondary | ICD-10-CM | POA: Diagnosis not present

## 2023-11-17 DIAGNOSIS — M5030 Other cervical disc degeneration, unspecified cervical region: Secondary | ICD-10-CM

## 2023-11-17 NOTE — Assessment & Plan Note (Signed)
Warned of potential triggering.  Discussed icing regimen and home exercises, which activities to do and which ones to avoid.  Increase activity slowly otherwise.  Discussed icing regimen.

## 2023-11-17 NOTE — Patient Instructions (Signed)
Injected the thumb Note for gym See you again in 3 months

## 2023-11-17 NOTE — Assessment & Plan Note (Signed)
Stable at the moment.  Discussed icing regimen and home exercises, increase activity slowly.  Follow-up again in 6 to 8 weeks

## 2023-11-30 ENCOUNTER — Other Ambulatory Visit: Payer: Self-pay | Admitting: Neurology

## 2023-12-01 MED ORDER — AMPHETAMINE-DEXTROAMPHETAMINE 10 MG PO TABS: 10.0000 mg | ORAL_TABLET | Freq: Every day | ORAL | 0 refills | Status: DC | PRN

## 2023-12-01 NOTE — Telephone Encounter (Signed)
 Last seen 01/26/23, next appt 02/01/24 Dispenses   Dispensed Days Supply Quantity Provider Pharmacy  DEXTROAMP-AMPHETAMIN 10 MG TAB 08/11/2023 30 30 each Athar, Saima, MD [id number]  DEXTROAMP-AMPHETAMIN 10 MG TAB 07/06/2023 30 30 each Athar, Saima, MD [id number]  DEXTROAMP-AMPHETAMIN 10 MG TAB 04/11/2023 30 30 each Athar, Saima, MD [id number]  DEXTROAMP-AMPHETAMIN 10 MG TAB 01/26/2023 30 30 each Athar, Saima, MD [id number]

## 2023-12-09 ENCOUNTER — Encounter: Payer: Self-pay | Admitting: Neurology

## 2023-12-13 ENCOUNTER — Encounter: Payer: Self-pay | Admitting: Hematology and Oncology

## 2024-01-26 ENCOUNTER — Other Ambulatory Visit: Payer: Self-pay

## 2024-01-26 ENCOUNTER — Ambulatory Visit: Admitting: Family Medicine

## 2024-01-26 ENCOUNTER — Encounter: Payer: Self-pay | Admitting: Family Medicine

## 2024-01-26 VITALS — BP 102/60 | HR 82 | Ht 66.0 in

## 2024-01-26 DIAGNOSIS — M19011 Primary osteoarthritis, right shoulder: Secondary | ICD-10-CM

## 2024-01-26 DIAGNOSIS — G8929 Other chronic pain: Secondary | ICD-10-CM | POA: Diagnosis not present

## 2024-01-26 DIAGNOSIS — M79645 Pain in left finger(s): Secondary | ICD-10-CM

## 2024-01-26 DIAGNOSIS — M19019 Primary osteoarthritis, unspecified shoulder: Secondary | ICD-10-CM | POA: Insufficient documentation

## 2024-01-26 NOTE — Assessment & Plan Note (Signed)
 Arthritic changes noted and given injection.  Improvement in range of motion almost immediately.  Does seem to have some signs that is consistent with a subacromial bursitis and may need injection as well.  Discussed icing regimen and home exercises.  Follow-up again in 6 to 8 weeks

## 2024-01-26 NOTE — Progress Notes (Signed)
 [name] [address] [place] [address] Phone: [phone] Subjective:    I'm seeing this patient by the request  of:  Renaye Rakers, MD  CC: Right shoulder pain  [id number]  Alicia [name] is a 58 y.o. female coming in with complaint of R shoulder pain.  08/17/2023 Patient does have a trigger finger noted of the left thumb.  Does seem to have calcified.  Still declines any injection at the moment.  Did discuss again the workup for potential hypercalcemia if needed.  Will pass note also on to primary care.      Patient does have the arthritis noted but does have some calcific changes noted on ultrasound today.  Do believe that the calcium is giving him trouble.  We did discuss potential laboratory workup which patient did not want to do at this moment.  Will start with her tart cherry.  If worsening symptoms would do a calcium workup, vitamin D as well as uric acid.  Patient will follow-up with me again 6 to 8 weeks      Updated 11/17/2023 Alicia [name] is a 58 y.o. female coming in with complaint of finger pain. Patient states that she is looking for voice recognition software. Pain in thumb is the same as it has been. Swelling occurs near the end of the day on busy days. Wears a wrist brace at night that splints thumb in extended position. Notices a crunching in all of the joints in her fingers. Using glucosamine/chondroitin supplement daily.    Neck has been doing ok. Able to perform rotation with less pain.  Overall but if she does the exercises she seems better  Update 01/26/2024  Patient states got the new [place]. Try to walk three miles at least. Right shoulder hurts especially when she is lying horizontal. Shoulder pain started last Tuesday. Taking Aleve for the pain and gabapentin. Adhesive capsulitis is what she think might be going on with the shoulder.      Past Medical History:  Diagnosis Date   Allergy    Hypersomnia  with sleep apnea, unspecified 05/23/2013   OSA on CPAP 08/15/2013   Wears glasses    Past Surgical History:  Procedure Laterality Date   COLONOSCOPY WITH PROPOFOL N/A 12/04/2020   Procedure: COLONOSCOPY WITH PROPOFOL;  Surgeon: Jeani Hawking, MD;  Location: [facility];  Service: Endoscopy;  Laterality: N/A;   COLONOSCOPY WITH PROPOFOL N/A 07/22/2022   Procedure: COLONOSCOPY WITH PROPOFOL;  Surgeon: Jeani Hawking, MD;  Location: [facility];  Service: Gastroenterology;  Laterality: N/A;   HYSTEROSCOPY WITH D & C     WISDOM TOOTH EXTRACTION     Social History   Socioeconomic History   Marital status: Single    Spouse name: Not on file   Number of children: 0   Years of education: Post Grad   Highest education level: Not on file  Occupational History    Comment: Artist  Tobacco Use   Smoking status: Never   Smokeless tobacco: Never  Vaping Use   Vaping status: Never Used  Substance and Sexual Activity   Alcohol use: No   Drug use: No   Sexual activity: Never  Other Topics Concern   Not on file  Social History Narrative   Patient lives at home alone.   Caffeine Use: occasionally soda   Social Drivers of Corporate investment banker Strain: Not on file  Food Insecurity: Not on file  Transportation Needs: Not on  file  Physical Activity: Not on file  Stress: Not on file  Social Connections: Not on file   Allergies  Allergen Reactions   Latex Itching and Rash   Other     Seasonal    Lubricants Itching and Rash   Family History  Problem Relation Age of Onset   Sleep apnea Mother    Diabetes Father        type 2   Depression Father    Dementia Father    Narcolepsy Father        cataplexy   High Cholesterol Father    Stroke Maternal Grandmother    Heart disease Maternal Grandfather       Current Outpatient Medications (Respiratory):    fexofenadine (ALLEGRA) 180 MG tablet, Take 180 mg by mouth daily as needed for allergies.   triamcinolone  (NASACORT) 55 MCG/ACT AERO nasal inhaler, Place 2 sprays into the nose 2 (two) times daily as needed (allergies).   Current Outpatient Medications (Hematological):    Cobalamin Combinations (B-12) [phone] MCG SUBL,   Current Outpatient Medications (Other):    amphetamine-dextroamphetamine (ADDERALL) 10 MG tablet, Take 1 tablet (10 mg total) by mouth daily as needed.   b complex vitamins capsule, Take 1 capsule by mouth daily.   Bepotastine Besilate 1.5 % SOLN, Place 1 drop into both eyes daily as needed (allergies).   betamethasone valerate lotion (VALISONE) 0.1 %, Apply 1 application topically daily as needed for irritation (scalp).   Biotin w/ Vitamins C & E (HAIR SKIN & NAILS GUMMIES PO), Take 2 each by mouth daily.   cholecalciferol (VITAMIN D3) 25 MCG (1000 UNIT) tablet, Take 3,000 Units by mouth daily.   desonide (DESOWEN) 0.05 % lotion, Apply 1 application topically 2 (two) times daily as needed (irritation).   magnesium oxide (MAG-OX) 400 (240 Mg) MG tablet, Take 400 mg by mouth daily.   Multiple Vitamin (MULTIVITAMIN) tablet, Take 1 tablet by mouth daily.   Multiple Vitamin (STRESS FORMULA) TABS, Take 1 each by mouth daily.   Omega-3 Fatty Acids (FISH OIL) 1000 MG CAPS, Take by mouth.   pimecrolimus (ELIDEL) 1 % cream, Apply 1 application topically 2 (two) times daily as needed (irritation).   Tart Cherry 1200 MG CAPS, Take 1,200 mg by mouth daily.   Vitamin D, Ergocalciferol, (DRISDOL) 1.25 MG (50000 UNIT) CAPS capsule, Take 1 capsule (50,000 Units total) by mouth every 7 (seven) days.   Reviewed prior external information including notes and imaging from  primary care provider As well as notes that were available from care everywhere and other healthcare systems.  Past medical history, social, surgical and family history all reviewed in electronic medical record.  No pertanent information unless stated regarding to the chief complaint.   Review of Systems:  No headache,  visual changes, nausea, vomiting, diarrhea, constipation, dizziness, abdominal pain, skin rash, fevers, chills, night sweats, weight loss, swollen lymph nodes, body aches, joint swelling, chest pain, shortness of breath, mood changes. POSITIVE muscle aches  Objective  Blood pressure 102/60, pulse 82, height 5\' 6"  (1.676 m), last menstrual period 10/05/2017, SpO2 96%.   General: No apparent distress alert and oriented x3 mood and affect normal, dressed appropriately.  HEENT: Pupils equal, extraocular movements intact  Respiratory: Patient's speak in full sentences and does not appear short of breath  Cardiovascular: No lower extremity edema, non tender, no erythema  Right shoulder exam shows the patient has a positive crossover sign noted.  Some tenderness over the acromioclavicular joint.  Rotator cuff strength is intact.  Procedure: Real-time Ultrasound Guided Injection of right acromioclavicular joint Device: GE Logiq Q7 Ultrasound guided injection is preferred based studies that show increased duration, increased effect, greater accuracy, decreased procedural pain, increased response rate, and decreased cost with ultrasound guided versus blind injection.  Verbal informed consent obtained.  Time-out conducted.  Noted no overlying erythema, induration, or other signs of local infection.  Skin prepped in a sterile fashion.  Local anesthesia: Topical Ethyl chloride.  With sterile technique and under real time ultrasound guidance: With a 25-gauge half inch needle injected with 0.5 cc of 0.5% Marcaine and 0.5 cc of Kenalog 40 mg/mL Completed without difficulty  Pain immediately resolved suggesting accurate placement of the medication.  Advised to call if fevers/chills, erythema, induration, drainage, or persistent bleeding.  Images saved Impression: Technically successful ultrasound guided injection.    Impression and Recommendations:     The above documentation has been reviewed and is  accurate and complete Judi Saa, DO

## 2024-01-26 NOTE — Patient Instructions (Signed)
 Good to see you. AC joint HEP. Injected shoulder today. Return in 2 to 3 weeks.

## 2024-01-31 NOTE — Progress Notes (Unsigned)
 PATIENT: Alicia [name] DOB: 06-17-1966  REASON FOR VISIT: follow up HISTORY FROM: patient  No chief complaint on file.    HISTORY OF PRESENT ILLNESS:  01/31/24 ALL:  Alicia [name] is a 58 y.o. female here today for follow up for OSA on CPAP with hypersomnia. She was last seen by Dr Frances Furbish and doing well. Adderall IR 10mg  continued daily as needed for hypersomnia. Since,   Set up???  HISTORY: (copied from Dr Teofilo Pod previous note)  Alicia [name] is a 58 year old woman with an underlying medical history of allergies, hyperlipidemia, and mild obesity, who presents for follow-up consultation of her sleep apnea, associated with hypersomnolence. The patient is unaccompanied today and presents for her 1 year check-up. I last saw her on 01/27/2022, at which time she was compliant with her AutoPap machine.  She had difficulty tolerating the nasal pillows.  She was given a nasal cushion interface as a sample to try at home.  She was using Adderall as needed, sparingly, and typically utilizes using 10 mg once daily as needed.  We talked about retrying Provigil if she were to encounter delays in filling her Adderall prescription due to shortage.    Today, 01/26/2023: I reviewed her AutoPap compliance data from 12/26/2022 through 01/24/2023, which is a total of 30 days, during which time she used her machine every night with percent use days greater than 4 hours at 97%, indicating excellent compliance, average usage of 6 hours and 44 minutes, residual AHI mildly elevated at 10/h, 95th percentile of pressure at 12.5 cm with a range of 7 to 14 cm with EPR of 2.  She does have some central events.  Leak acceptable with the 95th percentile at 9.8 L/min, central respiratory index at 4.3/h.  Epworth sleepiness score is 7 out of 24.  She reports overall doing well, she is using nasal pillows from [employer] but does have some trouble keeping the mask in place particularly because the headgear is  difficult to stay in place due to her thicker hair.  She does try to get the headgear strap either higher up or very low, she also has to use a chinstrap to keep her job from opening.  She uses Adderall generally once daily and it is helpful.  She has no new concerns or complaints today.     REVIEW OF SYSTEMS: Out of a complete 14 system review of symptoms, the patient complains only of the following symptoms, fatigue, and all other reviewed systems are negative.  ESS:  ALLERGIES: Allergies  Allergen Reactions   Latex Itching and Rash   Other     Seasonal    Lubricants Itching and Rash    HOME MEDICATIONS: Outpatient Medications Prior to Visit  Medication Sig Dispense Refill   amphetamine-dextroamphetamine (ADDERALL) 10 MG tablet Take 1 tablet (10 mg total) by mouth daily as needed. 30 tablet 0   b complex vitamins capsule Take 1 capsule by mouth daily.     Bepotastine Besilate 1.5 % SOLN Place 1 drop into both eyes daily as needed (allergies).     betamethasone valerate lotion (VALISONE) 0.1 % Apply 1 application topically daily as needed for irritation (scalp).     Biotin w/ Vitamins C & E (HAIR SKIN & NAILS GUMMIES PO) Take 2 each by mouth daily.     cholecalciferol (VITAMIN D3) 25 MCG (1000 UNIT) tablet Take 3,000 Units by mouth daily.     Cobalamin Combinations (B-12) [phone] MCG SUBL  desonide (DESOWEN) 0.05 % lotion Apply 1 application topically 2 (two) times daily as needed (irritation).     fexofenadine (ALLEGRA) 180 MG tablet Take 180 mg by mouth daily as needed for allergies.     magnesium oxide (MAG-OX) 400 (240 Mg) MG tablet Take 400 mg by mouth daily.     Multiple Vitamin (MULTIVITAMIN) tablet Take 1 tablet by mouth daily.     Multiple Vitamin (STRESS FORMULA) TABS Take 1 each by mouth daily.     Omega-3 Fatty Acids (FISH OIL) 1000 MG CAPS Take by mouth.     pimecrolimus (ELIDEL) 1 % cream Apply 1 application topically 2 (two) times daily as needed (irritation).      Tart Cherry 1200 MG CAPS Take 1,200 mg by mouth daily.     triamcinolone (NASACORT) 55 MCG/ACT AERO nasal inhaler Place 2 sprays into the nose 2 (two) times daily as needed (allergies).     Vitamin D, Ergocalciferol, (DRISDOL) 1.25 MG (50000 UNIT) CAPS capsule Take 1 capsule (50,000 Units total) by mouth every 7 (seven) days. 12 capsule 0   No facility-administered medications prior to visit.    PAST MEDICAL HISTORY: Past Medical History:  Diagnosis Date   Allergy    Hypersomnia with sleep apnea, unspecified 05/23/2013   OSA on CPAP 08/15/2013   Wears glasses     PAST SURGICAL HISTORY: Past Surgical History:  Procedure Laterality Date   COLONOSCOPY WITH PROPOFOL N/A 12/04/2020   Procedure: COLONOSCOPY WITH PROPOFOL;  Surgeon: Jeani Hawking, MD;  Location: [facility];  Service: Endoscopy;  Laterality: N/A;   COLONOSCOPY WITH PROPOFOL N/A 07/22/2022   Procedure: COLONOSCOPY WITH PROPOFOL;  Surgeon: Jeani Hawking, MD;  Location: [facility];  Service: Gastroenterology;  Laterality: N/A;   HYSTEROSCOPY WITH D & C     WISDOM TOOTH EXTRACTION      FAMILY HISTORY: Family History  Problem Relation Age of Onset   Sleep apnea Mother    Diabetes Father        type 2   Depression Father    Dementia Father    Narcolepsy Father        cataplexy   High Cholesterol Father    Stroke Maternal Grandmother    Heart disease Maternal Grandfather     SOCIAL HISTORY: Social History   Socioeconomic History   Marital status: Single    Spouse name: Not on file   Number of children: 0   Years of education: Post Grad   Highest education level: Not on file  Occupational History    Comment: Artist  Tobacco Use   Smoking status: Never   Smokeless tobacco: Never  Vaping Use   Vaping status: Never Used  Substance and Sexual Activity   Alcohol use: No   Drug use: No   Sexual activity: Never  Other Topics Concern   Not on file  Social History Narrative   Patient lives at  home alone.   Caffeine Use: occasionally soda   Social Drivers of Corporate investment banker Strain: Not on file  Food Insecurity: Not on file  Transportation Needs: Not on file  Physical Activity: Not on file  Stress: Not on file  Social Connections: Not on file  Intimate Partner Violence: Not on file     PHYSICAL EXAM  There were no vitals filed for this visit. There is no height or weight on file to calculate BMI.  Generalized: Well developed, in no acute distress  Cardiology: normal rate and rhythm,  no murmur noted Respiratory: clear to auscultation bilaterally  Neurological examination  Mentation: Alert oriented to time, place, history taking. Follows all commands speech and language fluent Cranial nerve II-XII: Pupils were equal round reactive to light. Extraocular movements were full, visual field were full  Motor: The motor testing reveals 5 over 5 strength of all 4 extremities. Good symmetric motor tone is noted throughout.  Gait and station: Gait is normal.    DIAGNOSTIC DATA (LABS, IMAGING, TESTING) - I reviewed patient records, labs, notes, testing and imaging myself where available.      No data to display           Lab Results  Component Value Date   WBC 5.7 01/04/2021   HGB 13.2 01/04/2021   HCT 40.3 01/04/2021   MCV 98.3 01/04/2021   PLT 341 01/04/2021      Component Value Date/Time   NA 139 01/04/2021 1550   K 4.1 01/04/2021 1550   CL 106 01/04/2021 1550   CO2 26 01/04/2021 1550   GLUCOSE 92 01/04/2021 1550   BUN 14 01/04/2021 1550   CREATININE 1.00 01/04/2021 1550   CALCIUM 8.9 01/04/2021 1550   PROT 7.5 01/04/2021 1550   ALBUMIN 3.9 01/04/2021 1550   AST 21 01/04/2021 1550   ALT 18 01/04/2021 1550   ALKPHOS 65 01/04/2021 1550   BILITOT 0.9 01/04/2021 1550   GFRNONAA >60 01/04/2021 1550   No results found for: "CHOL", "HDL", "LDLCALC", "LDLDIRECT", "TRIG", "CHOLHDL" No results found for: "HGBA1C" No results found for:  "VITAMINB12" No results found for: "TSH"   ASSESSMENT AND PLAN 58 y.o. year old female  has a past medical history of Allergy, Hypersomnia with sleep apnea, unspecified (05/23/2013), OSA on CPAP (08/15/2013), and Wears glasses. here with   No diagnosis found.    Alliyah Roesler is doing well on CPAP therapy. Compliance report reveals ***. *** was encouraged to continue using CPAP nightly and for greater than 4 hours each night. We will update supply orders as indicated. Risks of untreated sleep apnea review and education materials provided. Healthy lifestyle habits encouraged. *** will follow up in ***, sooner if needed. *** verbalizes understanding and agreement with this plan.    No orders of the defined types were placed in this encounter.    No orders of the defined types were placed in this encounter.     Shawnie Dapper, FNP-C 01/31/2024, 10:53 AM [facility] [address] [place], [place] [address] [phone]

## 2024-01-31 NOTE — Patient Instructions (Incomplete)
 Please continue using your CPAP regularly. While your insurance requires that you use CPAP at least 4 hours each night on 70% of the nights, I recommend, that you not skip any nights and use it throughout the night if you can. Getting used to CPAP and staying with the treatment long term does take time and patience and discipline. Untreated obstructive sleep apnea when it is moderate to severe can have an adverse impact on cardiovascular health and raise her risk for heart disease, arrhythmias, hypertension, congestive heart failure, stroke and diabetes. Untreated obstructive sleep apnea causes sleep disruption, nonrestorative sleep, and sleep deprivation. This can have an impact on your day to day functioning and cause daytime sleepiness and impairment of cognitive function, memory loss, mood disturbance, and problems focussing. Using CPAP regularly can improve these symptoms.  We will update supply orders, today. I will talk to Dr Frances Furbish about they type of testing she would rather do. I would like to bring you in to see if we can find a pressure range that will better treat your apneic events.   We will stop Adderall and restart armodafinil 150mg  daily.   Follow up pending study results. Call me once you receive your new machine and we will get an appt set up.

## 2024-01-31 NOTE — Progress Notes (Unsigned)
 Alicia [name]

## 2024-02-01 ENCOUNTER — Encounter: Payer: Self-pay | Admitting: Family Medicine

## 2024-02-01 ENCOUNTER — Ambulatory Visit: Payer: 59 | Admitting: Family Medicine

## 2024-02-01 VITALS — BP 128/81 | HR 59 | Ht 65.0 in | Wt 228.0 lb

## 2024-02-01 DIAGNOSIS — G4730 Sleep apnea, unspecified: Secondary | ICD-10-CM

## 2024-02-01 DIAGNOSIS — G4733 Obstructive sleep apnea (adult) (pediatric): Secondary | ICD-10-CM | POA: Diagnosis not present

## 2024-02-01 DIAGNOSIS — G4710 Hypersomnia, unspecified: Secondary | ICD-10-CM | POA: Diagnosis not present

## 2024-02-01 MED ORDER — ARMODAFINIL 150 MG PO TABS: 150.0000 mg | ORAL_TABLET | Freq: Every day | ORAL | 1 refills | Status: DC

## 2024-02-01 NOTE — Addendum Note (Signed)
 Addended by: Shawnie Dapper L on: 02/01/2024 12:56 PM   Modules accepted: Orders

## 2024-02-05 ENCOUNTER — Telehealth: Payer: Self-pay | Admitting: Family Medicine

## 2024-02-05 NOTE — Telephone Encounter (Signed)
 Patient called to say that the pharmacy does not have her prescription for 100 mg gabapentin. She was calling to check in to see if that was sent over.

## 2024-02-07 ENCOUNTER — Other Ambulatory Visit: Payer: Self-pay

## 2024-02-07 MED ORDER — GABAPENTIN 100 MG PO CAPS: 200.0000 mg | ORAL_CAPSULE | Freq: Every day | ORAL | 0 refills | Status: AC

## 2024-02-07 NOTE — Telephone Encounter (Signed)
 Yes please

## 2024-02-15 ENCOUNTER — Ambulatory Visit: Payer: 59 | Admitting: Family Medicine

## 2024-02-16 ENCOUNTER — Ambulatory Visit: Admitting: Neurology

## 2024-02-16 DIAGNOSIS — G4733 Obstructive sleep apnea (adult) (pediatric): Secondary | ICD-10-CM

## 2024-02-19 MED ORDER — MODAFINIL 200 MG PO TABS: 200.0000 mg | ORAL_TABLET | Freq: Every day | ORAL | 3 refills | Status: AC

## 2024-02-19 NOTE — Telephone Encounter (Signed)
 Okay to start Modafinil  200 mg daily.

## 2024-04-09 ENCOUNTER — Encounter: Payer: Self-pay | Admitting: Family Medicine

## 2024-04-10 NOTE — Progress Notes (Unsigned)
 [facility] [address] [place] [address] Phone: [phone] Subjective:   Alicia [name], am serving as a scribe for Dr. Ronnell Coins.  I'm seeing this patient by the request  of:  Jonathon Neighbors, MD  CC: Shoulder pain bone pain follow-up  [id number]  01/26/2024 Arthritic changes noted and given injection.  Improvement in range of motion almost immediately.  Does seem to have some signs that is consistent with a subacromial bursitis and may need injection as well.  Discussed icing regimen and home exercises.  Follow-up again in 6 to 8 weeks     Updated 04/11/2024 Alicia [name] is a 58 y.o. female coming in with complaint of shoulder pain. Shoulder and thumbs are ok.  Nothing severe.  Nothing that stopping her from activity at the moment.       Past Medical History:  Diagnosis Date   Allergy    Hypersomnia with sleep apnea, unspecified 05/23/2013   OSA on CPAP 08/15/2013   Wears glasses    Past Surgical History:  Procedure Laterality Date   COLONOSCOPY WITH PROPOFOL  N/A 12/04/2020   Procedure: COLONOSCOPY WITH PROPOFOL ;  Surgeon: Alvis Jourdain, MD;  Location: [facility];  Service: Endoscopy;  Laterality: N/A;   COLONOSCOPY WITH PROPOFOL  N/A 07/22/2022   Procedure: COLONOSCOPY WITH PROPOFOL ;  Surgeon: Alvis Jourdain, MD;  Location: [facility];  Service: Gastroenterology;  Laterality: N/A;   HYSTEROSCOPY WITH D & C     WISDOM TOOTH EXTRACTION     Social History   Socioeconomic History   Marital status: Single    Spouse name: Not on file   Number of children: 0   Years of education: Post Grad   Highest education level: Not on file  Occupational History    Comment: Artist  Tobacco Use   Smoking status: Never   Smokeless tobacco: Never  Vaping Use   Vaping status: Never Used  Substance and Sexual Activity   Alcohol use: No   Drug use: No   Sexual activity: Never  Other Topics Concern   Not on file   Social History Narrative   Patient lives at home alone.   Caffeine Use: occasionally soda   Social Drivers of Corporate investment banker Strain: Not on file  Food Insecurity: Not on file  Transportation Needs: Not on file  Physical Activity: Not on file  Stress: Not on file  Social Connections: Not on file   Allergies  Allergen Reactions   Latex Itching and Rash   Other     Seasonal    Lubricants Itching and Rash   Family History  Problem Relation Age of Onset   Sleep apnea Mother    Diabetes Father        type 2   Depression Father    Dementia Father    Narcolepsy Father        cataplexy   High Cholesterol Father    Sleep apnea Father    Stroke Maternal Grandmother    Heart disease Maternal Grandfather       Current Outpatient Medications (Respiratory):    fexofenadine (ALLEGRA) 180 MG tablet, Take 180 mg by mouth daily as needed for allergies.   triamcinolone  (NASACORT ) 55 MCG/ACT AERO nasal inhaler, Place 2 sprays into the nose 2 (two) times daily as needed (allergies).   Current Outpatient Medications (Hematological):    Cobalamin Combinations (B-12) [phone] MCG SUBL,   Current Outpatient Medications (Other):    gabapentin  (NEURONTIN ) 100  MG capsule, Take 2 capsules (200 mg total) by mouth at bedtime.   b complex vitamins capsule, Take 1 capsule by mouth daily.   Bepotastine Besilate 1.5 % SOLN, Place 1 drop into both eyes daily as needed (allergies).   betamethasone  valerate lotion (VALISONE ) 0.1 %, Apply 1 application topically daily as needed for irritation (scalp).   Biotin w/ Vitamins C & E (HAIR SKIN & NAILS GUMMIES PO), Take 2 each by mouth daily.   cholecalciferol (VITAMIN D3) 25 MCG (1000 UNIT) tablet, Take 3,000 Units by mouth daily.   desonide (DESOWEN) 0.05 % lotion, Apply 1 application topically 2 (two) times daily as needed (irritation).   modafinil  (PROVIGIL ) 200 MG tablet, Take 1 tablet (200 mg total) by mouth daily.   Multiple Vitamin  (MULTIVITAMIN) tablet, Take 1 tablet by mouth daily.   Multiple Vitamin (STRESS FORMULA) TABS, Take 1 each by mouth daily.   Omega-3 Fatty Acids (FISH OIL) 1000 MG CAPS, Take by mouth.   pimecrolimus (ELIDEL) 1 % cream, Apply 1 application topically 2 (two) times daily as needed (irritation).   Tart Cherry 1200 MG CAPS, Take 1,200 mg by mouth daily.   Reviewed prior external information including notes and imaging from  primary care provider As well as notes that were available from care everywhere and other healthcare systems.  Past medical history, social, surgical and family history all reviewed in electronic medical record.  No pertanent information unless stated regarding to the chief complaint.   Review of Systems:  No headache, visual changes, nausea, vomiting, diarrhea, constipation, dizziness, abdominal pain, skin rash, fevers, chills, night sweats, weight loss, swollen lymph nodes, body aches, joint swelling, chest pain, shortness of breath, mood changes. POSITIVE muscle aches  Objective  Blood pressure 124/82, pulse 68, height 5' 5 (1.651 m), weight 230 lb (104.3 kg), last menstrual period 10/05/2017, SpO2 97%.   General: No apparent distress alert and oriented x3 mood and affect normal, dressed appropriately.  HEENT: Pupils equal, extraocular movements intact  Respiratory: Patient's speak in full sentences and does not appear short of breath  Cardiovascular: No lower extremity edema, non tender, no erythema  Does have arthritic changes noted.  CMC joint is with positive grind test noted.  First MTP of the foot does have some hallux rigidus noted on the right side.   Limited muscular skeletal ultrasound was performed and interpreted by Ronnell Coins, M  Limited ultrasound of patient's first MTP does show some hypoechoic changes still noted of the joint.  Patient's thumb also has some narrowing of the CMC joint.   Impression and Recommendations:    The above documentation  has been reviewed and is accurate and complete Alicia [name] M Alicia Huwe, DO

## 2024-04-10 NOTE — Progress Notes (Signed)
 See procedure note.

## 2024-04-11 ENCOUNTER — Ambulatory Visit: Admitting: Family Medicine

## 2024-04-11 ENCOUNTER — Encounter: Payer: Self-pay | Admitting: Family Medicine

## 2024-04-11 VITALS — BP 124/82 | HR 68 | Ht 65.0 in | Wt 230.0 lb

## 2024-04-11 DIAGNOSIS — G8929 Other chronic pain: Secondary | ICD-10-CM

## 2024-04-11 DIAGNOSIS — M5030 Other cervical disc degeneration, unspecified cervical region: Secondary | ICD-10-CM

## 2024-04-11 DIAGNOSIS — M19079 Primary osteoarthritis, unspecified ankle and foot: Secondary | ICD-10-CM | POA: Diagnosis not present

## 2024-04-11 DIAGNOSIS — M545 Low back pain, unspecified: Secondary | ICD-10-CM | POA: Diagnosis not present

## 2024-04-11 NOTE — Assessment & Plan Note (Signed)
 Patient has not had any significant improvement but has not been extremely consistent with the treatment option at the moment.  Discussed potential injections.  Patient more concerned about losing weight than anything else.  Continues to have some difficulty with weight loss.  Feels like she is going to work on it very hard.  Feels like it is very important to her at the moment.  Follow-up again in 6 to 8 weeks

## 2024-04-11 NOTE — Patient Instructions (Signed)
 Read about Celebrex Get moving Wave shoe Send us  an update on Monday See you again in 3 months

## 2024-04-11 NOTE — Assessment & Plan Note (Signed)
 Will continue to monitor.  Sitting relatively comfortably.

## 2024-04-11 NOTE — Assessment & Plan Note (Signed)
 Stable as well.  No changes in management.  No change in medications.  Encouraged her to continue to work on the weight loss.  Can do physical therapy if necessary.

## 2024-04-12 NOTE — Procedures (Signed)
 [facility]  HOME SLEEP TEST (SANSA) REPORT (Mail-Out Device):   STUDY DATE: 03/24/24  DOB: [birth year]  MRN: [id number]  ORDERING CLINICIAN: Debbra Fairy, MD, PhD   REFERRING CLINICIAN: Terrilyn Fick, NP  CLINICAL INFORMATION/HISTORY: 58 year old woman with an underlying medical history of allergies, hyperlipidemia, and obesity, who presents for re-evaluation of her OSA. She has been on autoPAP of 7-14 cm with EPR of 2 with good tolerance, full compliance and mildly elevated residual AHI (with central and obstructive events noted). She should be eligible for new equipment.   BMI (at the time of sleep clinic visit and/or test date): 37.9 kg/m  FINDINGS:   Study Protocol:    The SANSA single-point-of-skin-contact chest-worn sensor - an FDA cleared and DOT approved type 4 home sleep test device - measures eight physiological channels,  including blood oxygen saturation (measured via PPG [photoplethysmography]), EKG-derived heart rate, respiratory effort, chest movement (measured via accelerometer), snoring, body position, and actigraphy. The device is designed to be worn for up to 10 hours per study.   Sleep Summary:   Total Recording Time (hours, min): 9 hours, 27 min  Total Effective Sleep Time (hours, min):  5 hours, 37 min  Sleep Efficiency (%):    60%   Respiratory Indices:   Calculated sAHI (per hour):  46.1/hour         Oxygen Saturation Statistics:    Oxygen Saturation (%) Mean: 93.2%   Minimum oxygen saturation (%):                 76.4%   O2 Saturation Range (%): 76.4 - 100%   Time below or at 88% saturation: 10 min   Pulse Rate Statistics:   Pulse Mean (bpm):    62/min    Pulse Range (47 - 109/min)   Snoring: mild to loud   IMPRESSION/DIAGNOSES:   OSA (obstructive sleep apnea), severe   RECOMMENDATIONS:   This home sleep test demonstrates severe obstructive sleep apnea with a total AHI of 46.1/hour and O2 nadir of 76.4% with time  below or at 88% saturation of 10 minutes for the study. Snoring was detected fairly consistently, ranging from mild to loud. Ongoing treatment with positive airway pressure is highly recommended. The patient will be advised to proceed treatment with a new autoPAP machine; I recommend 8-15 cm with EPR of 3, mask of choice, sized to fit. A laboratory attended titration study can be considered in the future for optimization of treatment settings and to improve tolerance and compliance, if needed, down the road. Alternative treatment options are limited secondary to the severity of the patient's sleep disordered breathing, but may include surgical treatment with an implantable hypoglossal nerve stimulator (in carefully selected candidates, meeting criteria).  Concomitant weight loss is recommended (where clinically appropriate). Please note, that untreated obstructive sleep apnea may carry additional perioperative morbidity. Patients with significant obstructive sleep apnea should receive perioperative PAP therapy and the surgeons and particularly the anesthesiologist should be informed of the diagnosis and the severity of the sleep disordered breathing. The patient should be cautioned not to drive, work at heights, or operate dangerous or heavy equipment when tired or sleepy. Review and reiteration of good sleep hygiene measures should be pursued with any patient. Other causes of the patient's symptoms, including circadian rhythm disturbances, an underlying mood disorder, medication effect and/or an underlying medical problem cannot be ruled out based on this test. Clinical correlation is recommended.  The patient and her referring provider will be notified  of the test results. The patient will be seen in follow up in sleep clinic at [facility].  I certify that I have reviewed the raw data recording prior to the issuance of this report in accordance with the standards of the American Academy of Sleep Medicine  (AASM).    INTERPRETING PHYSICIAN:   Debbra Fairy, MD, PhD Medical Director, [facility]) Diplomat, ABPN (Neurology and Sleep)   [facility] [address] [place], [place] [address] [phone]

## 2024-04-15 ENCOUNTER — Ambulatory Visit: Payer: Self-pay | Admitting: Family Medicine

## 2024-04-15 DIAGNOSIS — G4733 Obstructive sleep apnea (adult) (pediatric): Secondary | ICD-10-CM

## 2024-04-22 ENCOUNTER — Encounter: Payer: Self-pay | Admitting: Neurology

## 2024-04-23 NOTE — Telephone Encounter (Signed)
 I called pt.  She said she received machine prior to her having sleep test.  She has not used machine as was not sure if set up was right.  After speaking to pt and then Terri at [facility].   Pt had received machine due to the order 02-16-24 that [facility] took to be for a new machine.  (The order was signed by Greig CROME, NP on 03-05-2024). So pt received new machine prior to having her 03-24-2024 HST.  [facility] did receive the latest order  changed pressures to autopap 8-15cm.  I told pt that [facility] did update remotely the machine settings to this.  She may use machine.  I will f/u on her use tonight on airview to make sure it remotely connected.  She was a little frustrated about the process and that she was not told it would be changed remotely.  She had been using  her old machine.  She appreciated call back and would like phone calls vs emails.

## 2024-04-30 ENCOUNTER — Encounter: Payer: Self-pay | Admitting: Neurology

## 2024-06-04 NOTE — Progress Notes (Signed)
 Alicia [name]

## 2024-06-05 ENCOUNTER — Ambulatory Visit: Admitting: Family Medicine

## 2024-06-05 ENCOUNTER — Encounter: Payer: Self-pay | Admitting: Family Medicine

## 2024-06-05 VITALS — BP 112/72 | HR 67 | Ht 65.5 in | Wt 235.0 lb

## 2024-06-05 DIAGNOSIS — G4733 Obstructive sleep apnea (adult) (pediatric): Secondary | ICD-10-CM

## 2024-06-05 NOTE — Progress Notes (Signed)
 PATIENT: Tyjanae Bartek DOB: 01/10/1966  REASON FOR VISIT: follow up HISTORY FROM: patient  Chief Complaint  Patient presents with   Follow-up    Pt in room 1. Alone. Here for initial cpap visit. Patient has new machine, uses nightly. States she doesn't feel well rested, pt said mask fits fine.     HISTORY OF PRESENT ILLNESS:  06/05/24 ALL:  Shaniyah returns for initial PAP compliance review following receipt of her new autoPAP machine. She had an HST showing severe OSA with total AHI of 46.1/hour and O2 nadir of 76.4% with time below or at 88% saturation of 10 minutes for the study. Previous settings were 7-14 with elevated residual AHI. Dr Buck advised adjustment of pressures. She has used her new machine with adjusted pressures of 8-15 for the past 2 months. She continues to note elevated events. Average 12/hr. She does not feel she is resting well. She continues to have some sleepiness during the day. She sleeps in a inclined supine position. Unable to sleep on her side due to bursitis. She did not continue armodafinil .     02/01/2024 ALL:  Sharene Krikorian is a 58 y.o. female here today for follow up for OSA on CPAP with hypersomnia. She was last seen by Dr Buck and doing well. Adderall IR 10mg  continued daily as needed for hypersomnia.   Since, she reports stopping Adderall about a month ago. She felt it was causing her significant mood swings. She was previously on armodafinil  and felt it worked well. It was stopped due to insurance not covering. Modafinil  was not as effective.   She continues therapy nightly for approx 7.5 hours. She is doing well on therapy. She wakes feeling refreshed. She reports hypersomnia seems better managed on therapy. ESS 5/24. She denies concerns with machine or supplies. Using nasal pillow. Set up around 2018.      HISTORY: (copied from Dr Obie previous note)  Ms. Athens is a 58 year old woman with an underlying medical history  of allergies, hyperlipidemia, and mild obesity, who presents for follow-up consultation of her sleep apnea, associated with hypersomnolence. The patient is unaccompanied today and presents for her 1 year check-up. I last saw her on 01/27/2022, at which time she was compliant with her AutoPap machine.  She had difficulty tolerating the nasal pillows.  She was given a nasal cushion interface as a sample to try at home.  She was using Adderall as needed, sparingly, and typically utilizes using 10 mg once daily as needed.  We talked about retrying Provigil  if she were to encounter delays in filling her Adderall prescription due to shortage.    Today, 01/26/2023: I reviewed her AutoPap compliance data from 12/26/2022 through 01/24/2023, which is a total of 30 days, during which time she used her machine every night with percent use days greater than 4 hours at 97%, indicating excellent compliance, average usage of 6 hours and 44 minutes, residual AHI mildly elevated at 10/h, 95th percentile of pressure at 12.5 cm with a range of 7 to 14 cm with EPR of 2.  She does have some central events.  Leak acceptable with the 95th percentile at 9.8 L/min, central respiratory index at 4.3/h.  Epworth sleepiness score is 7 out of 24.  She reports overall doing well, she is using nasal pillows from [employer] but does have some trouble keeping the mask in place particularly because the headgear is difficult to stay in place due to her thicker hair.  She does  try to get the headgear strap either higher up or very low, she also has to use a chinstrap to keep her job from opening.  She uses Adderall generally once daily and it is helpful.  She has no new concerns or complaints today.     REVIEW OF SYSTEMS: Out of a complete 14 system review of symptoms, the patient complains only of the following symptoms, fatigue, and all other reviewed systems are negative.  ESS: 7/24  ALLERGIES: Allergies  Allergen Reactions   Latex Itching and  Rash   Other     Seasonal    Lubricants Itching and Rash    HOME MEDICATIONS: Outpatient Medications Prior to Visit  Medication Sig Dispense Refill   b complex vitamins capsule Take 1 capsule by mouth daily.     Bepotastine Besilate 1.5 % SOLN Place 1 drop into both eyes daily as needed (allergies).     betamethasone  valerate lotion (VALISONE ) 0.1 % Apply 1 application topically daily as needed for irritation (scalp).     Biotin w/ Vitamins C & E (HAIR SKIN & NAILS GUMMIES PO) Take 2 each by mouth daily.     cholecalciferol (VITAMIN D3) 25 MCG (1000 UNIT) tablet Take 3,000 Units by mouth daily.     Cobalamin Combinations (B-12) [phone] MCG SUBL      desonide (DESOWEN) 0.05 % lotion Apply 1 application topically 2 (two) times daily as needed (irritation).     fexofenadine (ALLEGRA) 180 MG tablet Take 180 mg by mouth daily as needed for allergies.     gabapentin  (NEURONTIN ) 100 MG capsule Take 2 capsules (200 mg total) by mouth at bedtime. 180 capsule 0   Multiple Vitamin (MULTIVITAMIN) tablet Take 1 tablet by mouth daily.     Multiple Vitamin (STRESS FORMULA) TABS Take 1 each by mouth daily.     Omega-3 Fatty Acids (FISH OIL) 1000 MG CAPS Take by mouth.     pimecrolimus (ELIDEL) 1 % cream Apply 1 application topically 2 (two) times daily as needed (irritation).     Tart Cherry 1200 MG CAPS Take 1,200 mg by mouth daily.     triamcinolone  (NASACORT ) 55 MCG/ACT AERO nasal inhaler Place 2 sprays into the nose 2 (two) times daily as needed (allergies).     modafinil  (PROVIGIL ) 200 MG tablet Take 1 tablet (200 mg total) by mouth daily. (Patient not taking: Reported on 06/05/2024) 30 tablet 3   No facility-administered medications prior to visit.    PAST MEDICAL HISTORY: Past Medical History:  Diagnosis Date   Allergy    Hypersomnia with sleep apnea, unspecified 05/23/2013   OSA on CPAP 08/15/2013   Wears glasses     PAST SURGICAL HISTORY: Past Surgical History:  Procedure Laterality  Date   COLONOSCOPY WITH PROPOFOL  N/A 12/04/2020   Procedure: COLONOSCOPY WITH PROPOFOL ;  Surgeon: Rollin Dover, MD;  Location: [facility];  Service: Endoscopy;  Laterality: N/A;   COLONOSCOPY WITH PROPOFOL  N/A 07/22/2022   Procedure: COLONOSCOPY WITH PROPOFOL ;  Surgeon: Rollin Dover, MD;  Location: [facility];  Service: Gastroenterology;  Laterality: N/A;   HYSTEROSCOPY WITH D & C     WISDOM TOOTH EXTRACTION      FAMILY HISTORY: Family History  Problem Relation Age of Onset   Sleep apnea Mother    Diabetes Father        type 2   Depression Father    Dementia Father    Narcolepsy Father        cataplexy   High  Cholesterol Father    Sleep apnea Father    Stroke Maternal Grandmother    Heart disease Maternal Grandfather     SOCIAL HISTORY: Social History   Socioeconomic History   Marital status: Single    Spouse name: Not on file   Number of children: 0   Years of education: Post Grad   Highest education level: Not on file  Occupational History    Comment: Artist  Tobacco Use   Smoking status: Never   Smokeless tobacco: Never  Vaping Use   Vaping status: Never Used  Substance and Sexual Activity   Alcohol use: No   Drug use: No   Sexual activity: Never  Other Topics Concern   Not on file  Social History Narrative   Patient lives at home alone.   Caffeine Use: occasionally soda   Social Drivers of Corporate investment banker Strain: Not on file  Food Insecurity: Not on file  Transportation Needs: Not on file  Physical Activity: Not on file  Stress: Not on file  Social Connections: Not on file  Intimate Partner Violence: Not on file     PHYSICAL EXAM  Vitals:   06/05/24 0751  BP: 112/72  Pulse: 67  SpO2: 98%  Weight: 235 lb (106.6 kg)  Height: 5' 5.5 (1.664 m)   Body mass index is 38.51 kg/m.  Generalized: Well developed, in no acute distress  Cardiology: normal rate and rhythm, no murmur noted Respiratory: clear to  auscultation bilaterally  Neurological examination  Mentation: Alert oriented to time, place, history taking. Follows all commands speech and language fluent Cranial nerve II-XII: Pupils were equal round reactive to light. Extraocular movements were full, visual field were full  Motor: The motor testing reveals 5 over 5 strength of all 4 extremities. Good symmetric motor tone is noted throughout.  Gait and station: Gait is normal.    DIAGNOSTIC DATA (LABS, IMAGING, TESTING) - I reviewed patient records, labs, notes, testing and imaging myself where available.      No data to display           Lab Results  Component Value Date   WBC 5.7 01/04/2021   HGB 13.2 01/04/2021   HCT 40.3 01/04/2021   MCV 98.3 01/04/2021   PLT 341 01/04/2021      Component Value Date/Time   NA 139 01/04/2021 1550   K 4.1 01/04/2021 1550   CL 106 01/04/2021 1550   CO2 26 01/04/2021 1550   GLUCOSE 92 01/04/2021 1550   BUN 14 01/04/2021 1550   CREATININE 1.00 01/04/2021 1550   CALCIUM 8.9 01/04/2021 1550   PROT 7.5 01/04/2021 1550   ALBUMIN 3.9 01/04/2021 1550   AST 21 01/04/2021 1550   ALT 18 01/04/2021 1550   ALKPHOS 65 01/04/2021 1550   BILITOT 0.9 01/04/2021 1550   GFRNONAA >60 01/04/2021 1550   No results found for: CHOL, HDL, LDLCALC, LDLDIRECT, TRIG, CHOLHDL No results found for: YHAJ8R No results found for: VITAMINB12 No results found for: TSH   ASSESSMENT AND PLAN 58 y.o. year old female  has a past medical history of Allergy, Hypersomnia with sleep apnea, unspecified (05/23/2013), OSA on CPAP (08/15/2013), and Wears glasses. here with     ICD-10-CM   1. OSA on CPAP  G47.33 Cpap titration      Laporshia Hogen is doing well on CPAP therapy but does not feel she is getting restful sleep. HST showed total AHI 46.1/h and O2 nadir 76.4%. AHI now  12/h. Compliance report reveals excellent compliance. AHI remains elevated despite multiple pressure adjustments. She is  sleeping on an incline and unable to avoid supine sleep due to chronic pain. She was encouraged to continue using CPAP nightly and for greater than 4 hours each night. We will update supply orders as indicated. Risks of untreated sleep apnea review and education materials provided. Healthy lifestyle habits encouraged. She will follow with me pending titration study. She verbalizes understanding and agreement with this plan.    Orders Placed This Encounter  Procedures   Cpap titration    CPAP titration for elevated AHI, with central events, may need to consider BiPAP    Standing Status:   Future    Expiration Date:   06/05/2025    Where should this test be performed::   [facility]     No orders of the defined types were placed in this encounter.    I spent 30 minutes of face-to-face and non-face-to-face time with patient.  This included previsit chart review, lab review, study review, order entry, electronic health record documentation, patient education.   Greig Forbes, FNP-C 06/05/2024, 8:25 AM [facility] [address], [address] [place], [place] [address] [phone]

## 2024-06-05 NOTE — Patient Instructions (Signed)
 Please continue using your CPAP regularly. While your insurance requires that you use CPAP at least 4 hours each night on 70% of the nights, I recommend, that you not skip any nights and use it throughout the night if you can. Getting used to CPAP and staying with the treatment long term does take time and patience and discipline. Untreated obstructive sleep apnea when it is moderate to severe can have an adverse impact on cardiovascular health and raise her risk for heart disease, arrhythmias, hypertension, congestive heart failure, stroke and diabetes. Untreated obstructive sleep apnea causes sleep disruption, nonrestorative sleep, and sleep deprivation. This can have an impact on your day to day functioning and cause daytime sleepiness and impairment of cognitive function, memory loss, mood disturbance, and problems focussing. Using CPAP regularly can improve these symptoms.  We will order a titration study to evaluate your pressure setting needs. Please listen for a call from our sleep lab to schedule.   Follow up pending sleep study results.

## 2024-06-06 ENCOUNTER — Ambulatory Visit
Admission: RE | Admit: 2024-06-06 | Discharge: 2024-06-06 | Disposition: A | Discharge status: Home / Self Care | Source: Ambulatory Visit | Attending: Hematology and Oncology | Admitting: Hematology and Oncology

## 2024-06-06 ENCOUNTER — Ambulatory Visit: Payer: Self-pay | Admitting: Hematology and Oncology

## 2024-06-06 DIAGNOSIS — Z9189 Other specified personal risk factors, not elsewhere classified: Secondary | ICD-10-CM

## 2024-06-06 MED ORDER — GADOPICLENOL 0.5 MMOL/ML IV SOLN
10.0000 mL | Freq: Once | INTRAVENOUS | Status: AC | PRN
  Administered 2024-06-06: 10 mL via INTRAVENOUS

## 2024-06-06 NOTE — Telephone Encounter (Signed)
I informed the patient the breast MRI is normal.

## 2024-06-10 ENCOUNTER — Telehealth: Payer: Self-pay | Admitting: Family Medicine

## 2024-06-10 NOTE — Telephone Encounter (Signed)
 CPAP [employer] pending

## 2024-06-11 NOTE — Telephone Encounter (Signed)
 CPAP [employer] auth: [id number] (exp. 06/11/24 to 09/09/24)

## 2024-06-19 NOTE — Telephone Encounter (Signed)
 I spoke with the patient.  CPAP [employer] auth: [id number] (exp. 06/11/24 to 09/09/24)   She is scheduled at [facility] for 07/09/24 at 8 pm.  Mailed packet and sent mychart

## 2024-07-09 ENCOUNTER — Ambulatory Visit (INDEPENDENT_AMBULATORY_CARE_PROVIDER_SITE_OTHER): Admitting: Neurology

## 2024-07-09 DIAGNOSIS — G4720 Circadian rhythm sleep disorder, unspecified type: Secondary | ICD-10-CM

## 2024-07-09 DIAGNOSIS — G4733 Obstructive sleep apnea (adult) (pediatric): Secondary | ICD-10-CM

## 2024-07-11 ENCOUNTER — Ambulatory Visit: Admitting: Family Medicine

## 2024-07-18 NOTE — Procedures (Unsigned)
 Physician Interpretation: Please see link under Procedure Tab or under Encounters tab for physician report, technical report, as well as O2 titration and/or PAP titration tables (if applicable).   Referred by: Dr. Modena Callander   History and Indication for Testing (obtained from visit note dated 04/25/2024): 58 year old female with an underlying complex medical history of stroke in May 2025, hypertension, hyperlipidemia, coronary artery disease, prior smoking, COPD, history of kidney stone, carotid artery disease with status post bilateral carotid endarterectomies, hypothyroidism, and overweight state, who reports snoring and nocturia. His Epworth sleepiness score is 3 out of 24, fatigue severity score is 15 out of 63.     Review of the EEG showed no abnormal electrical discharges and symmetrical bihemispheric findings.     EKG: The EKG revealed normal sinus rhythm (NSR). ***   AUDIO/VIDEO REVIEW: The audio and video review did not show any abnormal or unusual behaviors, movements, phonations or vocalizations. The patient took *** restroom breaks. Snoring was noted, ***   POST-STUDY QUESTIONNAIRE: Post study, the patient indicated, that sleep was *** the same as usual.    IMPRESSION:    Obstructive Sleep Apnea (OSA), *** ***Central Sleep Apnea (CSA) ***Primary Snoring ***Primary Central Sleep Apnea ***Complex Sleep Apnea ***PLMD (periodic limb movement disorder [of sleep]) ***Dysfunctions associated with sleep stages or arousal from sleep ***Non-specific abnormal electrocardiogram (EKG) ***Poor sleep pattern ***Inconclusive Test   RECOMMENDATIONS:         I certify that I have reviewed the entire raw data recording prior to the issuance of this report in accordance with the Standards of Accreditation of the American Academy of Sleep Medicine (AASM).   True Mar, MD, PhD Medical Director, [facility]) Diplomat, ABPN (Neurology and  Sleep)

## 2024-07-22 ENCOUNTER — Ambulatory Visit: Payer: Self-pay | Admitting: Family Medicine

## 2024-07-22 DIAGNOSIS — G4733 Obstructive sleep apnea (adult) (pediatric): Secondary | ICD-10-CM

## 2024-07-24 ENCOUNTER — Other Ambulatory Visit: Payer: Self-pay | Admitting: Family Medicine

## 2024-07-24 DIAGNOSIS — G4733 Obstructive sleep apnea (adult) (pediatric): Secondary | ICD-10-CM

## 2024-07-24 NOTE — Progress Notes (Unsigned)
 Alicia [name] Subjective:   Alicia [name], am serving as a scribe for Dr. Arthea Claudene.  I'm seeing this patient by the request  of:  Benjamine Aland, MD  CC: Multiple joint complaints will  YEP:Dlagzrupcz  04/11/2024 Stable as well.  No changes in management.  No change in medications.  Encouraged her to continue to work on the weight loss.  Can do physical therapy if necessary.     Will continue to monitor.  Sitting relatively comfortably.     Patient has not had any significant improvement but has not been extremely consistent with the treatment option at the moment.  Discussed potential injections.  Patient more concerned about losing weight than anything else.  Continues to have some difficulty with weight loss.  Feels like she is going to work on it very hard.  Feels like it is very important to her at the moment.  Follow-up again in 6 to 8 weeks     Updated 07/26/2024 Alicia [name] is a 58 y.o. female coming in with complaint of neck, lower back and 1st MTP arthritis. Patient states that her foot is improving. Thumb is not triggering. Neck is doing ok with stretches and dry needling.   Injection for R shoulder helped her ROM but she is having nerve pain in front of shoulder into her pec.   Wants to know about side effects for celebrex before she takes it.   Pain in proximal hamstring after sitting for prolonged periods. Took a ride to [place] by vehicle and pain was exacerbated.        Past Medical History:  Diagnosis Date   Allergy    Hypersomnia with sleep apnea, unspecified 05/23/2013   OSA on CPAP 08/15/2013   Wears glasses    Past Surgical History:  Procedure Laterality Date   COLONOSCOPY WITH PROPOFOL  N/A 12/04/2020   Procedure: COLONOSCOPY WITH PROPOFOL ;  Surgeon: Rollin Dover, MD;  Location: [facility];  Service: Endoscopy;  Laterality: N/A;   COLONOSCOPY WITH PROPOFOL   N/A 07/22/2022   Procedure: COLONOSCOPY WITH PROPOFOL ;  Surgeon: Rollin Dover, MD;  Location: [facility];  Service: Gastroenterology;  Laterality: N/A;   HYSTEROSCOPY WITH D & C     WISDOM TOOTH EXTRACTION     Social History   Socioeconomic History   Marital status: Single    Spouse name: Not on file   Number of children: 0   Years of education: Post Grad   Highest education level: Not on file  Occupational History    Comment: Artist  Tobacco Use   Smoking status: Never   Smokeless tobacco: Never  Vaping Use   Vaping status: Never Used  Substance and Sexual Activity   Alcohol use: No   Drug use: No   Sexual activity: Never  Other Topics Concern   Not on file  Social History Narrative   Patient lives at home alone.   Caffeine Use: occasionally soda   Social Drivers of Corporate investment banker Strain: Not on file  Food Insecurity: Not on file  Transportation Needs: Not on file  Physical Activity: Not on file  Stress: Not on file  Social Connections: Not on file   Allergies  Allergen Reactions   Latex Itching and Rash   Other     Seasonal    Lubricants Itching and Rash   Family History  Problem Relation Age of Onset   Sleep apnea  Mother    Diabetes Father        type 2   Depression Father    Dementia Father    Narcolepsy Father        cataplexy   High Cholesterol Father    Sleep apnea Father    Stroke Maternal Grandmother    Heart disease Maternal Grandfather       Current Outpatient Medications (Respiratory):    fexofenadine (ALLEGRA) 180 MG tablet, Take 180 mg by mouth daily as needed for allergies.   triamcinolone  (NASACORT ) 55 MCG/ACT AERO nasal inhaler, Place 2 sprays into the nose 2 (two) times daily as needed (allergies).  Current Outpatient Medications (Analgesics):    celecoxib (CELEBREX) 100 MG capsule, Take 1 capsule (100 mg total) by mouth 2 (two) times daily as needed.  Current Outpatient Medications (Hematological):     Cobalamin Combinations (B-12) [phone] MCG SUBL,   Current Outpatient Medications (Other):    b complex vitamins capsule, Take 1 capsule by mouth daily.   Bepotastine Besilate 1.5 % SOLN, Place 1 drop into both eyes daily as needed (allergies).   betamethasone  valerate lotion (VALISONE ) 0.1 %, Apply 1 application topically daily as needed for irritation (scalp).   Biotin w/ Vitamins C & E (HAIR SKIN & NAILS GUMMIES PO), Take 2 each by mouth daily.   cholecalciferol (VITAMIN D3) 25 MCG (1000 UNIT) tablet, Take 3,000 Units by mouth daily.   desonide (DESOWEN) 0.05 % lotion, Apply 1 application topically 2 (two) times daily as needed (irritation).   gabapentin  (NEURONTIN ) 100 MG capsule, Take 2 capsules (200 mg total) by mouth at bedtime.   modafinil  (PROVIGIL ) 200 MG tablet, Take 1 tablet (200 mg total) by mouth daily.   Multiple Vitamin (MULTIVITAMIN) tablet, Take 1 tablet by mouth daily.   Multiple Vitamin (STRESS FORMULA) TABS, Take 1 each by mouth daily.   Omega-3 Fatty Acids (FISH OIL) 1000 MG CAPS, Take by mouth.   pimecrolimus (ELIDEL) 1 % cream, Apply 1 application topically 2 (two) times daily as needed (irritation).   Tart Cherry 1200 MG CAPS, Take 1,200 mg by mouth daily.   Reviewed prior external information including notes and imaging from  primary care provider As well as notes that were available from care everywhere and other healthcare systems.  Past medical history, social, surgical and family history all reviewed in electronic medical record.  No pertanent information unless stated regarding to the chief complaint.   Review of Systems:  No headache, visual changes, nausea, vomiting, diarrhea, constipation, dizziness, abdominal pain, skin rash, fevers, chills, night sweats, weight loss, swollen lymph nodes, body aches, joint swelling, chest pain, shortness of breath, mood changes. POSITIVE muscle aches  Objective  Blood pressure 108/68, pulse 79, height 5' 5.5 (1.664 m),  weight 238 lb (108 kg), last menstrual period 10/05/2017, SpO2 96%.   General: No apparent distress alert and oriented x3 mood and affect normal, dressed appropriately.  HEENT: Pupils equal, extraocular movements intact  Respiratory: Patient's speak in full sentences and does not appear short of breath  Cardiovascular: No lower extremity edema, non tender, no erythema   Patient has gained some weight since we have seen her last.  Her core strength may be weaker, still has some hallux limitus noted.  Patient's ankle does have relatively good range of motion.  Right shoulder does still have some impingement noted with certain range of motion.  No significant weakness noted.  Neck exam does have some limited range of motion.   Impression  and Recommendations:     The above documentation has been reviewed and is accurate and complete Valli Randol M Ettie Krontz, DO

## 2024-07-24 NOTE — Telephone Encounter (Signed)
 Sent community message to [place] that order placed.

## 2024-07-26 ENCOUNTER — Encounter: Payer: Self-pay | Admitting: Family Medicine

## 2024-07-26 ENCOUNTER — Ambulatory Visit: Admitting: Family Medicine

## 2024-07-26 VITALS — BP 108/68 | HR 79 | Ht 65.5 in | Wt 238.0 lb

## 2024-07-26 DIAGNOSIS — M19079 Primary osteoarthritis, unspecified ankle and foot: Secondary | ICD-10-CM

## 2024-07-26 DIAGNOSIS — M5030 Other cervical disc degeneration, unspecified cervical region: Secondary | ICD-10-CM | POA: Diagnosis not present

## 2024-07-26 MED ORDER — CELECOXIB 100 MG PO CAPS: 100.0000 mg | ORAL_CAPSULE | Freq: Two times a day (BID) | ORAL | 3 refills | Status: AC | PRN

## 2024-07-26 NOTE — Patient Instructions (Addendum)
 Good to see you. Celebrex 100 Bid prn Read about the side effects of celebrex. Ultra olympus shoes. I think your PCP has a good idea on the weight loss.  See me again in 2 to 3 months.

## 2024-07-26 NOTE — Assessment & Plan Note (Signed)
 Patient shoes seem to be eroded.  Discussed other shoes that I think will be helpful.

## 2024-07-26 NOTE — Assessment & Plan Note (Signed)
 Known degenerative disc disease.  Discussed with patient about the pes planus as well as her trigger fingers previously.  Did discuss with patient about medication and secondary to the worsening discomfort will start Celebrex.  Warned of potential side effects.  See how patient responds to this and follow-up again in 6 to 8 weeks

## 2024-08-01 ENCOUNTER — Ambulatory Visit: Admitting: Family Medicine

## 2024-09-02 ENCOUNTER — Telehealth: Payer: Self-pay

## 2024-09-02 NOTE — Telephone Encounter (Signed)
 Pt called for clarification on her appt for this week. Asking for the purpose. Advised pt it is for her annual check up. She confirmed.

## 2024-09-05 ENCOUNTER — Inpatient Hospital Stay: Payer: 59 | Attending: Hematology and Oncology | Admitting: Hematology and Oncology

## 2024-09-05 VITALS — BP 120/77 | HR 66 | Temp 98.4°F | Resp 17 | Ht 65.5 in | Wt 237.8 lb

## 2024-09-05 DIAGNOSIS — Z9189 Other specified personal risk factors, not elsewhere classified: Secondary | ICD-10-CM | POA: Insufficient documentation

## 2024-09-05 DIAGNOSIS — E669 Obesity, unspecified: Secondary | ICD-10-CM | POA: Diagnosis not present

## 2024-09-05 DIAGNOSIS — M25519 Pain in unspecified shoulder: Secondary | ICD-10-CM | POA: Insufficient documentation

## 2024-09-05 DIAGNOSIS — N951 Menopausal and female climacteric states: Secondary | ICD-10-CM | POA: Insufficient documentation

## 2024-09-05 NOTE — Assessment & Plan Note (Signed)
 Dr. Tauna patient  Lifetime risk of breast cancer: 29% Patient decided not to take any antiestrogen therapy   Breast cancer surveillance: 1.  Breast exam 09/05/2024: Benign 2. mammogram 12/11/2023 at [facility]: Benign 3.  Breast MRI 06/06/2024: Benign breast density category B We discussed about obtaining a breast MRI every other year     Return to clinic in 1 year for follow-up

## 2024-09-05 NOTE — Progress Notes (Signed)
 Patient Care Team: Benjamine Aland, MD as PCP - General (Family Medicine) Armond Cape, MD as Consulting Physician (Obstetrics and Gynecology) Rollin Dover, MD as Consulting Physician (Gastroenterology)  DIAGNOSIS:  Encounter Diagnosis  Name Primary?   At high risk for breast cancer Yes   CHIEF COMPLIANT: Follow-up of being at high risk for breast cancer  HISTORY OF PRESENT ILLNESS:  History of Present Illness Alicia [name] is a 58 year old female who presents with shoulder pain and weight management concerns.  She experiences shoulder pain for the past month, described as nerve-like pain under the clavicle, primarily noticeable when lying down. She has cervical stenosis and received an injection for bursitis, which provided some relief.  She is actively managing her weight, attributing weight gain to menopause. She participates in water aerobics to reduce joint strain but continues to gain weight. She has made dietary changes, eliminating pasta, donuts, and potato chips, but struggles with sugar cravings.     ALLERGIES:  is allergic to latex, other, and lubricants.  MEDICATIONS:  Current Outpatient Medications  Medication Sig Dispense Refill   b complex vitamins capsule Take 1 capsule by mouth daily.     Bepotastine Besilate 1.5 % SOLN Place 1 drop into both eyes daily as needed (allergies).     betamethasone  valerate lotion (VALISONE ) 0.1 % Apply 1 application topically daily as needed for irritation (scalp).     Biotin w/ Vitamins C & E (HAIR SKIN & NAILS GUMMIES PO) Take 2 each by mouth daily.     celecoxib (CELEBREX) 100 MG capsule Take 1 capsule (100 mg total) by mouth 2 (two) times daily as needed. 60 capsule 3   cholecalciferol (VITAMIN D3) 25 MCG (1000 UNIT) tablet Take 3,000 Units by mouth daily.     Cobalamin Combinations (B-12) [phone] MCG SUBL      desonide (DESOWEN) 0.05 % lotion Apply 1 application topically 2 (two) times daily as needed (irritation).      fexofenadine (ALLEGRA) 180 MG tablet Take 180 mg by mouth daily as needed for allergies.     gabapentin  (NEURONTIN ) 100 MG capsule Take 2 capsules (200 mg total) by mouth at bedtime. 180 capsule 0   modafinil  (PROVIGIL ) 200 MG tablet Take 1 tablet (200 mg total) by mouth daily. 30 tablet 3   Multiple Vitamin (MULTIVITAMIN) tablet Take 1 tablet by mouth daily.     Multiple Vitamin (STRESS FORMULA) TABS Take 1 each by mouth daily.     Omega-3 Fatty Acids (FISH OIL) 1000 MG CAPS Take by mouth.     pimecrolimus (ELIDEL) 1 % cream Apply 1 application topically 2 (two) times daily as needed (irritation).     Tart Cherry 1200 MG CAPS Take 1,200 mg by mouth daily.     triamcinolone  (NASACORT ) 55 MCG/ACT AERO nasal inhaler Place 2 sprays into the nose 2 (two) times daily as needed (allergies).     No current facility-administered medications for this visit.    PHYSICAL EXAMINATION: ECOG PERFORMANCE STATUS: 1 - Symptomatic but completely ambulatory  Vitals:   09/05/24 1451  BP: 120/77  Pulse: 66  Resp: 17  Temp: 98.4 F (36.9 C)  SpO2: 98%   Filed Weights   09/05/24 1451  Weight: 237 lb 12.8 oz (107.9 kg)    LABORATORY DATA:  I have reviewed the data as listed    Latest Ref Rng & Units 01/04/2021    3:50 PM  CMP  Glucose 70 - 99 mg/dL 92   BUN 6 -  20 mg/dL 14   Creatinine 9.55 - 1.00 mg/dL 8.99   Sodium 864 - 854 mmol/L 139   Potassium 3.5 - 5.1 mmol/L 4.1   Chloride 98 - 111 mmol/L 106   CO2 22 - 32 mmol/L 26   Calcium 8.9 - 10.3 mg/dL 8.9   Total Protein 6.5 - 8.1 g/dL 7.5   Total Bilirubin 0.3 - 1.2 mg/dL 0.9   Alkaline Phos 38 - 126 U/L 65   AST 15 - 41 U/L 21   ALT 0 - 44 U/L 18     Lab Results  Component Value Date   WBC 5.7 01/04/2021   HGB 13.2 01/04/2021   HCT 40.3 01/04/2021   MCV 98.3 01/04/2021   PLT 341 01/04/2021   NEUTROABS 3.3 01/04/2021    ASSESSMENT & PLAN:  At high risk for breast cancer Dr. Tauna patient  Lifetime risk of breast cancer:  29% Patient decided not to take any antiestrogen therapy   Breast cancer surveillance: 1.  Breast exam 09/05/2024: Benign 2. mammogram 12/11/2023 at [facility]: Benign 3.  Breast MRI 06/06/2024: Benign breast density category B We discussed about obtaining a breast MRI every other year     Obesity/weight issues: Causing her knee problems which is making it difficult for her to exercise. Return to clinic in 1 year for follow-up ------------------------------------- Assessment and Plan Assessment & Plan Breast cancer surveillance and screening Breast density category A, reducing breast cancer risk. Mammogram results satisfactory. - Continue annual mammograms. - Schedule breast MRI every other year, next due in 2026.  Menopausal symptoms Weight gain and joint pain likely due to menopause. Discussed GLP-1 agonists for weight management; insurance does not cover. Considered Weight Watchers as alternative. Discussed weight regain risk if GLP-1 agonists discontinued. - Continue water aerobics for joint health. - Consider Weight Watchers for weight management.  Obesity Weight gain linked to menopause and lifestyle. Discussed GLP-1 agonists for weight loss; insurance coverage is a barrier. - Continue lifestyle modifications, including reducing sugar intake. - Consider metabolic analysis for weight management solutions.      No orders of the defined types were placed in this encounter.  The patient has a good understanding of the overall plan. she agrees with it. she will call with any problems that may develop before the next visit here.  I personally spent a total of 30 minutes in the care of the patient today including preparing to see the patient, getting/reviewing separately obtained history, performing a medically appropriate exam/evaluation, counseling and educating, placing orders, referring and communicating with other health care professionals, documenting clinical information in the  EHR, independently interpreting results, communicating results, and coordinating care.   Viinay K Cas Tracz, MD 09/05/24

## 2024-09-25 NOTE — Progress Notes (Unsigned)
 [name] [address] [place] [address] Phone: [phone] Subjective:   Alicia [name] Alicia [name], am serving as a scribe for Dr. Arthea [name].  I'm seeing this patient by the request  of:  Benjamine Aland, MD  CC: Neck and finger pain  YEP:Dlagzrupcz  07/26/2024 Patient shoes seem to be eroded.  Discussed other shoes that I think will be helpful.     Known degenerative disc disease.  Discussed with patient about the pes planus as well as her trigger fingers previously.  Did discuss with patient about medication and secondary to the worsening discomfort will start Celebrex .  Warned of potential side effects.  See how patient responds to this and follow-up again in 6 to 8 weeks   Updated 09/26/2024 Alicia [name] is a 58 y.o. female coming in with complaint of neck and finger pain. Patient states that her neck has been doing better. Foot is doing well.   R shoulder injection was helpful but it has worn off. Painful to lie flat.   Also c/o L ischial tuberosity pain after driving in the car to [place].        Past Medical History:  Diagnosis Date   Allergy    Hypersomnia with sleep apnea, unspecified 05/23/2013   OSA on CPAP 08/15/2013   Wears glasses    Past Surgical History:  Procedure Laterality Date   COLONOSCOPY WITH PROPOFOL  N/A 12/04/2020   Procedure: COLONOSCOPY WITH PROPOFOL ;  Surgeon: Rollin Dover, MD;  Location: [facility];  Service: Endoscopy;  Laterality: N/A;   COLONOSCOPY WITH PROPOFOL  N/A 07/22/2022   Procedure: COLONOSCOPY WITH PROPOFOL ;  Surgeon: Rollin Dover, MD;  Location: [facility];  Service: Gastroenterology;  Laterality: N/A;   HYSTEROSCOPY WITH D & C     WISDOM TOOTH EXTRACTION     Social History   Socioeconomic History   Marital status: Single    Spouse name: Not on file   Number of children: 0   Years of education: Post Grad   Highest education level: Not on file  Occupational History    Comment:  Artist  Tobacco Use   Smoking status: Never   Smokeless tobacco: Never  Vaping Use   Vaping status: Never Used  Substance and Sexual Activity   Alcohol use: No   Drug use: No   Sexual activity: Never  Other Topics Concern   Not on file  Social History Narrative   Patient lives at home alone.   Caffeine Use: occasionally soda   Social Drivers of Corporate Investment Banker Strain: Not on file  Food Insecurity: Not on file  Transportation Needs: Not on file  Physical Activity: Not on file  Stress: Not on file  Social Connections: Not on file   Allergies  Allergen Reactions   Latex Itching and Rash   Other     Seasonal    Lubricants Itching and Rash   Family History  Problem Relation Age of Onset   Sleep apnea Mother    Diabetes Father        type 2   Depression Father    Dementia Father    Narcolepsy Father        cataplexy   High Cholesterol Father    Sleep apnea Father    Stroke Maternal Grandmother    Heart disease Maternal Grandfather       Current Outpatient Medications (Respiratory):    fexofenadine (ALLEGRA) 180 MG tablet, Take 180 mg by mouth daily  as needed for allergies.   triamcinolone  (NASACORT ) 55 MCG/ACT AERO nasal inhaler, Place 2 sprays into the nose 2 (two) times daily as needed (allergies).  Current Outpatient Medications (Analgesics):    celecoxib  (CELEBREX ) 100 MG capsule, Take 1 capsule (100 mg total) by mouth 2 (two) times daily as needed.  Current Outpatient Medications (Hematological):    Cobalamin Combinations (B-12) [phone] MCG SUBL,   Current Outpatient Medications (Other):    b complex vitamins capsule, Take 1 capsule by mouth daily.   Bepotastine Besilate 1.5 % SOLN, Place 1 drop into both eyes daily as needed (allergies).   betamethasone  valerate lotion (VALISONE ) 0.1 %, Apply 1 application topically daily as needed for irritation (scalp).   Biotin w/ Vitamins C & E (HAIR SKIN & NAILS GUMMIES PO), Take 2 each by  mouth daily.   cholecalciferol (VITAMIN D3) 25 MCG (1000 UNIT) tablet, Take 3,000 Units by mouth daily.   desonide (DESOWEN) 0.05 % lotion, Apply 1 application topically 2 (two) times daily as needed (irritation).   gabapentin  (NEURONTIN ) 100 MG capsule, Take 2 capsules (200 mg total) by mouth at bedtime.   modafinil  (PROVIGIL ) 200 MG tablet, Take 1 tablet (200 mg total) by mouth daily.   Multiple Vitamin (MULTIVITAMIN) tablet, Take 1 tablet by mouth daily.   Multiple Vitamin (STRESS FORMULA) TABS, Take 1 each by mouth daily.   Omega-3 Fatty Acids (FISH OIL) 1000 MG CAPS, Take by mouth.   pimecrolimus (ELIDEL) 1 % cream, Apply 1 application topically 2 (two) times daily as needed (irritation).   Tart Cherry 1200 MG CAPS, Take 1,200 mg by mouth daily.   Reviewed prior external information including notes and imaging from  primary care provider As well as notes that were available from care everywhere and other healthcare systems.  Past medical history, social, surgical and family history all reviewed in electronic medical record.  No pertanent information unless stated regarding to the chief complaint.   Review of Systems:  No headache, visual changes, nausea, vomiting, diarrhea, constipation, dizziness, abdominal pain, skin rash, fevers, chills, night sweats, weight loss, swollen lymph nodes, body aches, joint swelling, chest pain, shortness of breath, mood changes. POSITIVE muscle aches  Objective  Blood pressure 112/68, pulse 85, height 5' 5.5 (1.664 m), last menstrual period 10/05/2017, SpO2 96%.   General: No apparent distress alert and oriented x3 mood and affect normal, dressed appropriately.  HEENT: Pupils equal, extraocular movements intact  Respiratory: Patient's speak in full sentences and does not appear short of breath  Cardiovascular: No lower extremity edema, non tender, no erythema  Right shoulder positive impingement, positive crossover.  Normal rotator cuff strength does  have some limited range of motion.  Procedure: Real-time Ultrasound Guided Injection of right glenohumeral joint Device: GE Logiq Q7  Ultrasound guided injection is preferred based studies that show increased duration, increased effect, greater accuracy, decreased procedural pain, increased response rate with ultrasound guided versus blind injection.  Verbal informed consent obtained.  Time-out conducted.  Noted no overlying erythema, induration, or other signs of local infection.  Skin prepped in a sterile fashion.  Local anesthesia: Topical Ethyl chloride.  With sterile technique and under real time ultrasound guidance:  Joint visualized.  23g 1  inch needle inserted posterior approach. Pictures taken for needle placement. Patient did have injection of 2 cc of 1% lidocaine , 2 cc of 0.5% Marcaine, and 1.0 cc of Kenalog  40 mg/dL. Completed without difficulty  Pain immediately resolved suggesting accurate placement of the medication.  Advised  to call if fevers/chills, erythema, induration, drainage, or persistent bleeding.  Images saved Impression: Technically successful ultrasound guided injection.  Procedure: Real-time Ultrasound Guided Injection of right acromioclavicular joint Device: GE Logiq Q7 Ultrasound guided injection is preferred based studies that show increased duration, increased effect, greater accuracy, decreased procedural pain, increased response rate, and decreased cost with ultrasound guided versus blind injection.  Verbal informed consent obtained.  Time-out conducted.  Noted no overlying erythema, induration, or other signs of local infection.  Skin prepped in a sterile fashion.  Local anesthesia: Topical Ethyl chloride.  With sterile technique and under real time ultrasound guidance: With a 25-gauge half inch needle injected with 0.5 cc of 0.5% Marcaine and 0.5 cc of Kenalog  40 mg/mL Completed without difficulty  Pain immediately resolved suggesting accurate  placement of the medication.  Advised to call if fevers/chills, erythema, induration, drainage, or persistent bleeding.  Impression: Technically successful ultrasound guided injection.      Impression and Recommendations:     The above documentation has been reviewed and is accurate and complete Alicia Dolloff M Caycee Wanat, DO

## 2024-09-26 ENCOUNTER — Encounter: Payer: Self-pay | Admitting: Family Medicine

## 2024-09-26 ENCOUNTER — Other Ambulatory Visit: Payer: Self-pay

## 2024-09-26 ENCOUNTER — Ambulatory Visit: Admitting: Family Medicine

## 2024-09-26 VITALS — BP 112/68 | HR 85 | Ht 65.5 in

## 2024-09-26 DIAGNOSIS — M25511 Pain in right shoulder: Secondary | ICD-10-CM

## 2024-09-26 DIAGNOSIS — G8929 Other chronic pain: Secondary | ICD-10-CM | POA: Insufficient documentation

## 2024-09-26 DIAGNOSIS — M545 Low back pain, unspecified: Secondary | ICD-10-CM

## 2024-09-26 DIAGNOSIS — M19011 Primary osteoarthritis, right shoulder: Secondary | ICD-10-CM

## 2024-09-26 NOTE — Patient Instructions (Addendum)
 Injected R shoulder in 2 spots Try either the tennis ball or the coat trick Treat yourself See you again in 3 months

## 2024-09-26 NOTE — Assessment & Plan Note (Signed)
 Repeat injection given again today.  Tolerated the procedure well, discussed icing regimen and home exercises.  Do believe that they will make significant improvement with patient's birthday coming up as well.  Follow-up again in 6 to 12 weeks known degenerative disc disease of the cervical spine that can be contributing as well

## 2024-09-26 NOTE — Assessment & Plan Note (Signed)
 Patient given injection and tolerated the procedure well.  Improvement in range of motion almost immediately.  We discussed that this continues about very advanced imaging.  Patient wants to hold at that at this moment.  Increase activity slowly.  Follow-up again in 6 to 12 weeks

## 2024-09-26 NOTE — Assessment & Plan Note (Signed)
 Patient is only having pain when she is sitting in the car.  We discussed proper sitting position.

## 2024-10-17 ENCOUNTER — Emergency Department (HOSPITAL_BASED_OUTPATIENT_CLINIC_OR_DEPARTMENT_OTHER): Admitting: Radiology

## 2024-10-17 ENCOUNTER — Other Ambulatory Visit: Payer: Self-pay

## 2024-10-17 ENCOUNTER — Encounter (HOSPITAL_BASED_OUTPATIENT_CLINIC_OR_DEPARTMENT_OTHER): Payer: Self-pay

## 2024-10-17 DIAGNOSIS — Y92480 Sidewalk as the place of occurrence of the external cause: Secondary | ICD-10-CM | POA: Insufficient documentation

## 2024-10-17 DIAGNOSIS — Y9301 Activity, walking, marching and hiking: Secondary | ICD-10-CM | POA: Insufficient documentation

## 2024-10-17 DIAGNOSIS — M25552 Pain in left hip: Secondary | ICD-10-CM | POA: Diagnosis present

## 2024-10-17 DIAGNOSIS — S76012A Strain of muscle, fascia and tendon of left hip, initial encounter: Secondary | ICD-10-CM | POA: Diagnosis not present

## 2024-10-17 DIAGNOSIS — Z91040 Latex allergy status: Secondary | ICD-10-CM | POA: Diagnosis not present

## 2024-10-17 DIAGNOSIS — X501XXA Overexertion from prolonged static or awkward postures, initial encounter: Secondary | ICD-10-CM | POA: Diagnosis not present

## 2024-10-17 NOTE — ED Triage Notes (Signed)
 Pt c/o mechanical fall, trying to avoid falling, somehow twisted my L hip. C/o pain since, especially w weight placed on L leg. No obvious deformity, +ROM in hip/ leg

## 2024-10-18 ENCOUNTER — Emergency Department (HOSPITAL_BASED_OUTPATIENT_CLINIC_OR_DEPARTMENT_OTHER)
Admission: EM | Admit: 2024-10-18 | Discharge: 2024-10-18 | Disposition: A | Discharge status: Home / Self Care | Attending: Emergency Medicine | Admitting: Emergency Medicine

## 2024-10-18 DIAGNOSIS — S76012A Strain of muscle, fascia and tendon of left hip, initial encounter: Secondary | ICD-10-CM

## 2024-10-18 DIAGNOSIS — S7002XA Contusion of left hip, initial encounter: Secondary | ICD-10-CM

## 2024-10-18 MED ORDER — HYDROCODONE-ACETAMINOPHEN 5-325 MG PO TABS: 1.0000 | ORAL_TABLET | ORAL | 0 refills | Status: AC | PRN

## 2024-10-18 MED ORDER — HYDROCODONE-ACETAMINOPHEN 5-325 MG PO TABS
1.0000 | ORAL_TABLET | Freq: Once | ORAL | Status: DC
  Filled 2024-10-18: qty 1

## 2024-10-18 MED ORDER — IBUPROFEN 800 MG PO TABS
800.0000 mg | ORAL_TABLET | Freq: Once | ORAL | Status: AC
  Administered 2024-10-18: 800 mg via ORAL
  Filled 2024-10-18: qty 1

## 2024-10-18 MED ORDER — METHOCARBAMOL 500 MG PO TABS: 500.0000 mg | ORAL_TABLET | Freq: Two times a day (BID) | ORAL | 0 refills | Status: AC

## 2024-10-18 MED ORDER — ONDANSETRON 4 MG PO TBDP
4.0000 mg | ORAL_TABLET | Freq: Once | ORAL | Status: DC
  Filled 2024-10-18: qty 1

## 2024-10-18 NOTE — ED Provider Notes (Signed)
 " [facility] EMERGENCY DEPARTMENT AT [facility] Provider Note   CSN: [id number] Arrival date & time: 10/17/24  2018     Patient presents with: Fall and Hip Pain (L)   Alicia [name] is a 58 y.o. female.   Patient presents to the emergency department after mechanical fall.  She reports that she was walking outside, stumbled on a walkway and fell.  She reports that she twisted her left hip and then fell onto that side.  She is having pain in the lateral and posterior aspect of the left hip that worsens when she puts weight on it.  She has been able to walk since the fall.  No other injuries.       Prior to Admission medications  Medication Sig Start Date End Date Taking? Authorizing Provider  HYDROcodone -acetaminophen  (NORCO/VICODIN) 5-325 MG tablet Take 1 tablet by mouth every 4 (four) hours as needed for moderate pain (pain score 4-6). 10/18/24  Yes Mychelle Kendra, Lonni PARAS, MD  methocarbamol  (ROBAXIN ) 500 MG tablet Take 1 tablet (500 mg total) by mouth 2 (two) times daily. 10/18/24  Yes Kiyan Burmester, Lonni PARAS, MD  b complex vitamins capsule Take 1 capsule by mouth daily.    [provider]  Bepotastine Besilate 1.5 % SOLN Place 1 drop into both eyes daily as needed (allergies).    [provider]  betamethasone  valerate lotion (VALISONE ) 0.1 % Apply 1 application topically daily as needed for irritation (scalp). 05/31/13   [provider]  Biotin w/ Vitamins C & E (HAIR SKIN & NAILS GUMMIES PO) Take 2 each by mouth daily.    [provider]  celecoxib  (CELEBREX ) 100 MG capsule Take 1 capsule (100 mg total) by mouth 2 (two) times daily as needed. 07/26/24   Claudene Arthea HERO, DO  cholecalciferol (VITAMIN D3) 25 MCG (1000 UNIT) tablet Take 3,000 Units by mouth daily.    [provider]  Cobalamin Combinations (B-12) [id number] MCG SUBL     [provider]  desonide (DESOWEN) 0.05 % lotion Apply 1 application topically 2 (two)  times daily as needed (irritation).    [provider]  fexofenadine (ALLEGRA) 180 MG tablet Take 180 mg by mouth daily as needed for allergies.    [provider]  gabapentin  (NEURONTIN ) 100 MG capsule Take 2 capsules (200 mg total) by mouth at bedtime. 02/07/24   Claudene Arthea HERO, DO  modafinil  (PROVIGIL ) 200 MG tablet Take 1 tablet (200 mg total) by mouth daily. 02/19/24   Athar, Saima, MD  Multiple Vitamin (MULTIVITAMIN) tablet Take 1 tablet by mouth daily.    [provider]  Multiple Vitamin (STRESS FORMULA) TABS Take 1 each by mouth daily.    [provider]  Omega-3 Fatty Acids (FISH OIL) 1000 MG CAPS Take by mouth.    [provider]  pimecrolimus (ELIDEL) 1 % cream Apply 1 application topically 2 (two) times daily as needed (irritation).    [provider]  Tart Cherry 1200 MG CAPS Take 1,200 mg by mouth daily.    [provider]  triamcinolone  (NASACORT ) 55 MCG/ACT AERO nasal inhaler Place 2 sprays into the nose 2 (two) times daily as needed (allergies).    [provider]    Allergies: Latex, Other, and Lubricants    Review of Systems  Updated Vital Signs LMP 10/05/2017   Physical Exam Vitals and nursing note reviewed.  Constitutional:      Appearance: Normal appearance.  HENT:     Head:  Atraumatic.  Cardiovascular:     Rate and Rhythm: Normal rate and regular rhythm.  Pulmonary:     Effort: Pulmonary effort is normal.     Breath sounds: Normal breath sounds.  Musculoskeletal:        General: Normal range of motion.     Left hip: Tenderness (Lateral and posterior soft tissues) present. No deformity. Normal range of motion.     Comments: Pain with external rotation and and abduction  Neurological:     Mental Status: She is alert.     (all labs ordered are listed, but only abnormal results are displayed) Labs Reviewed - No data to display  EKG: None  Radiology: DG Hip Unilat W or Wo Pelvis  2-3 Views Left Result Date: 10/17/2024 CLINICAL DATA:  Left hip pain after fall. EXAM: DG HIP (WITH OR WITHOUT PELVIS) 2-3V LEFT COMPARISON:  None Available. FINDINGS: No acute fracture of the pelvis or left hip. No hip dislocation. The hip joint spaces are preserved. The pubic rami are intact. There is no pubic symphyseal or sacroiliac diastasis. Pelvic phleboliths. IMPRESSION: No fracture or dislocation of the pelvis or left hip. Electronically Signed   By: Andrea Gasman M.D.   On: 10/17/2024 22:12     Procedures   Medications Ordered in the ED  ibuprofen  (ADVIL ) tablet 800 mg (has no administration in time range)  HYDROcodone -acetaminophen  (NORCO/VICODIN) 5-325 MG per tablet 1 tablet (has no administration in time range)  ondansetron  (ZOFRAN -ODT) disintegrating tablet 4 mg (has no administration in time range)                                    Medical Decision Making Amount and/or Complexity of Data Reviewed Radiology: ordered.   Presents with left hip pain after a fall.  Pain is in the lateral aspects of the hip, likely in the area where she landed.  Presentation consistent with contusion of the soft tissues.  X-ray negative.     Final diagnoses:  Hip strain, left, initial encounter  Contusion of left hip, initial encounter    ED Discharge Orders          Ordered    HYDROcodone -acetaminophen  (NORCO/VICODIN) 5-325 MG tablet  Every 4 hours PRN        10/18/24 0048    methocarbamol  (ROBAXIN ) 500 MG tablet  2 times daily        10/18/24 0048               Haze Lonni PARAS, MD 10/18/24 [phone]  "

## 2024-10-24 ENCOUNTER — Encounter: Payer: Self-pay | Admitting: Family Medicine

## 2024-10-29 ENCOUNTER — Other Ambulatory Visit: Payer: Self-pay | Admitting: Family Medicine

## 2024-10-29 DIAGNOSIS — G4733 Obstructive sleep apnea (adult) (pediatric): Secondary | ICD-10-CM

## 2024-10-30 ENCOUNTER — Telehealth: Payer: Self-pay

## 2024-10-30 NOTE — Telephone Encounter (Signed)
 SABRA

## 2024-11-05 ENCOUNTER — Telehealth: Payer: Self-pay

## 2024-11-05 DIAGNOSIS — G4733 Obstructive sleep apnea (adult) (pediatric): Secondary | ICD-10-CM

## 2024-11-05 NOTE — Telephone Encounter (Signed)
-----   Message from Greig Forbes, NP sent at 11/04/2024  6:10 PM EST ----- Can you send me a report for the past 10 days please? I want to assess her AHI after decreasing pressures. ----- Message ----- From: Forbes Greig, NP Sent: 11/04/2024  12:00 AM EST To: Greig Forbes, NP  Check AHI, now 16 on 15cmH20, reduced to 13

## 2024-11-05 NOTE — Telephone Encounter (Signed)
 SABRA

## 2024-11-06 NOTE — Telephone Encounter (Signed)
 Alicia [name]

## 2024-11-08 NOTE — Telephone Encounter (Signed)
 You can try a lower pressure setting because her centrals are quite high.  Maybe go back to AutoPap if this machine allows, pressure of 6 to 10 cm with EPR of 2, review download in a month and see how it goes?

## 2024-11-08 NOTE — Addendum Note (Signed)
 Addended byBETHA FORBES, Kamara Allan L on: 11/08/2024 11:21 AM   Modules accepted: Orders

## 2024-11-25 ENCOUNTER — Telehealth: Payer: Self-pay | Admitting: Family Medicine

## 2024-11-25 NOTE — Telephone Encounter (Signed)
 SABRA

## 2024-12-26 ENCOUNTER — Ambulatory Visit: Admitting: Family Medicine

## 2025-09-04 ENCOUNTER — Inpatient Hospital Stay: Admitting: Hematology and Oncology
